# Patient Record
Sex: Male | Born: 1945 | Race: White | Hispanic: No | Marital: Married | Smoking: Former smoker
Health system: Southern US, Community
[De-identification: ages and names within clinical notes are randomized; demographics above are authoritative.]

## PROBLEM LIST (undated history)

## (undated) DIAGNOSIS — J449 Chronic obstructive pulmonary disease, unspecified: Secondary | ICD-10-CM

## (undated) DIAGNOSIS — I2510 Atherosclerotic heart disease of native coronary artery without angina pectoris: Secondary | ICD-10-CM

## (undated) DIAGNOSIS — K2990 Gastroduodenitis, unspecified, without bleeding: Secondary | ICD-10-CM

## (undated) DIAGNOSIS — R972 Elevated prostate specific antigen [PSA]: Secondary | ICD-10-CM

## (undated) DIAGNOSIS — N400 Enlarged prostate without lower urinary tract symptoms: Secondary | ICD-10-CM

## (undated) DIAGNOSIS — T8859XA Other complications of anesthesia, initial encounter: Secondary | ICD-10-CM

## (undated) DIAGNOSIS — F4310 Post-traumatic stress disorder, unspecified: Secondary | ICD-10-CM

## (undated) DIAGNOSIS — C61 Malignant neoplasm of prostate: Secondary | ICD-10-CM

## (undated) DIAGNOSIS — E785 Hyperlipidemia, unspecified: Secondary | ICD-10-CM

## (undated) DIAGNOSIS — E669 Obesity, unspecified: Secondary | ICD-10-CM

## (undated) DIAGNOSIS — Z9889 Other specified postprocedural states: Secondary | ICD-10-CM

## (undated) DIAGNOSIS — J45909 Unspecified asthma, uncomplicated: Secondary | ICD-10-CM

## (undated) DIAGNOSIS — D123 Benign neoplasm of transverse colon: Secondary | ICD-10-CM

## (undated) DIAGNOSIS — T4145XA Adverse effect of unspecified anesthetic, initial encounter: Secondary | ICD-10-CM

## (undated) DIAGNOSIS — F419 Anxiety disorder, unspecified: Secondary | ICD-10-CM

## (undated) DIAGNOSIS — N200 Calculus of kidney: Secondary | ICD-10-CM

## (undated) DIAGNOSIS — M51379 Other intervertebral disc degeneration, lumbosacral region without mention of lumbar back pain or lower extremity pain: Secondary | ICD-10-CM

## (undated) DIAGNOSIS — H8110 Benign paroxysmal vertigo, unspecified ear: Secondary | ICD-10-CM

## (undated) DIAGNOSIS — N289 Disorder of kidney and ureter, unspecified: Secondary | ICD-10-CM

## (undated) DIAGNOSIS — I10 Essential (primary) hypertension: Secondary | ICD-10-CM

## (undated) DIAGNOSIS — K2970 Gastritis, unspecified, without bleeding: Secondary | ICD-10-CM

## (undated) DIAGNOSIS — IMO0002 Reserved for concepts with insufficient information to code with codable children: Secondary | ICD-10-CM

## (undated) DIAGNOSIS — I219 Acute myocardial infarction, unspecified: Secondary | ICD-10-CM

## (undated) DIAGNOSIS — R112 Nausea with vomiting, unspecified: Secondary | ICD-10-CM

## (undated) DIAGNOSIS — Z87442 Personal history of urinary calculi: Secondary | ICD-10-CM

## (undated) DIAGNOSIS — M5137 Other intervertebral disc degeneration, lumbosacral region: Secondary | ICD-10-CM

## (undated) DIAGNOSIS — Q849 Congenital malformation of integument, unspecified: Secondary | ICD-10-CM

## (undated) HISTORY — DX: Benign paroxysmal vertigo, unspecified ear: H81.10

## (undated) HISTORY — DX: Congenital malformation of integument, unspecified: Q84.9

## (undated) HISTORY — DX: Other intervertebral disc degeneration, lumbosacral region: M51.37

## (undated) HISTORY — DX: Atherosclerotic heart disease of native coronary artery without angina pectoris: I25.10

## (undated) HISTORY — DX: Post-traumatic stress disorder, unspecified: F43.10

## (undated) HISTORY — PX: CHOLECYSTECTOMY: SHX55

## (undated) HISTORY — PX: CYST EXCISION: SHX5701

## (undated) HISTORY — PX: COLONOSCOPY: SHX174

## (undated) HISTORY — DX: Anxiety disorder, unspecified: F41.9

## (undated) HISTORY — DX: Benign prostatic hyperplasia without lower urinary tract symptoms: N40.0

## (undated) HISTORY — PX: KNEE ARTHROSCOPY: SUR90

## (undated) HISTORY — PX: HERNIA REPAIR: SHX51

## (undated) HISTORY — DX: Benign neoplasm of transverse colon: D12.3

## (undated) HISTORY — PX: LUMBAR LAMINECTOMY: SHX95

## (undated) HISTORY — PX: BACK SURGERY: SHX140

## (undated) HISTORY — DX: Gastritis, unspecified, without bleeding: K29.70

## (undated) HISTORY — DX: Other intervertebral disc degeneration, lumbosacral region without mention of lumbar back pain or lower extremity pain: M51.379

## (undated) HISTORY — DX: Unspecified asthma, uncomplicated: J45.909

## (undated) HISTORY — DX: Personal history of urinary calculi: Z87.442

## (undated) HISTORY — DX: Hyperlipidemia, unspecified: E78.5

## (undated) HISTORY — DX: Gastroduodenitis, unspecified, without bleeding: K29.90

## (undated) HISTORY — DX: Obesity, unspecified: E66.9

## (undated) HISTORY — DX: Elevated prostate specific antigen (PSA): R97.20

## (undated) HISTORY — DX: Acute myocardial infarction, unspecified: I21.9

## (undated) HISTORY — DX: Chronic obstructive pulmonary disease, unspecified: J44.9

## (undated) HISTORY — DX: Essential (primary) hypertension: I10

## (undated) HISTORY — PX: VASECTOMY: SHX75

## (undated) HISTORY — PX: INGUINAL HERNIA REPAIR: SUR1180

## (undated) HISTORY — DX: Malignant neoplasm of prostate: C61

## (undated) HISTORY — PX: CARPAL TUNNEL RELEASE: SHX101

## (undated) HISTORY — DX: Reserved for concepts with insufficient information to code with codable children: IMO0002

---
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
## 2008-09-26 ENCOUNTER — Inpatient Hospital Stay: Payer: Self-pay | Admitting: Internal Medicine

## 2008-10-04 ENCOUNTER — Ambulatory Visit: Payer: Self-pay | Admitting: Family Medicine

## 2009-03-06 HISTORY — PX: CARDIAC CATHETERIZATION: SHX172

## 2009-06-14 ENCOUNTER — Ambulatory Visit: Payer: Self-pay | Admitting: Family Medicine

## 2009-07-07 ENCOUNTER — Encounter: Payer: Self-pay | Admitting: Family Medicine

## 2009-07-26 ENCOUNTER — Ambulatory Visit: Payer: Self-pay | Admitting: Family Medicine

## 2009-08-06 ENCOUNTER — Encounter: Payer: Self-pay | Admitting: Family Medicine

## 2012-10-16 ENCOUNTER — Ambulatory Visit: Payer: Self-pay | Admitting: Family Medicine

## 2012-10-22 ENCOUNTER — Encounter: Payer: Self-pay | Admitting: Family Medicine

## 2012-11-04 ENCOUNTER — Encounter: Payer: Self-pay | Admitting: Family Medicine

## 2013-03-18 ENCOUNTER — Ambulatory Visit: Payer: Self-pay | Admitting: Family Medicine

## 2013-05-28 ENCOUNTER — Ambulatory Visit: Payer: Self-pay | Admitting: Family Medicine

## 2013-06-04 ENCOUNTER — Ambulatory Visit: Payer: Self-pay | Admitting: Family Medicine

## 2013-06-05 ENCOUNTER — Encounter: Payer: Self-pay | Admitting: *Deleted

## 2013-06-05 ENCOUNTER — Encounter: Payer: Self-pay | Admitting: Cardiovascular Disease

## 2013-06-05 ENCOUNTER — Ambulatory Visit (INDEPENDENT_AMBULATORY_CARE_PROVIDER_SITE_OTHER): Payer: BC Managed Care – PPO | Admitting: Cardiovascular Disease

## 2013-06-05 ENCOUNTER — Encounter (INDEPENDENT_AMBULATORY_CARE_PROVIDER_SITE_OTHER): Payer: Self-pay

## 2013-06-05 VITALS — BP 130/92 | HR 68 | Ht 67.0 in | Wt 238.8 lb

## 2013-06-05 VITALS — BP 134/84 | HR 68 | Ht 67.0 in | Wt 238.8 lb

## 2013-06-05 DIAGNOSIS — R079 Chest pain, unspecified: Secondary | ICD-10-CM

## 2013-06-05 DIAGNOSIS — I10 Essential (primary) hypertension: Secondary | ICD-10-CM | POA: Insufficient documentation

## 2013-06-05 DIAGNOSIS — E785 Hyperlipidemia, unspecified: Secondary | ICD-10-CM | POA: Insufficient documentation

## 2013-06-05 MED ORDER — ESOMEPRAZOLE MAGNESIUM 40 MG PO PACK: 40.0000 mg | PACK | Freq: Every day | ORAL | Status: DC

## 2013-06-05 NOTE — Assessment & Plan Note (Signed)
Chest pain is overall atypical. He did have previous cardiac catheterization in 2010 which showed no significant coronary artery disease. I proceeded with a treadmill stress test today which showed no evidence of ischemia. He did not have any chest/epigastric pain with exercise. Based on the above, I do not think that his chest pain is cardiac. Based on his description, it's likely GI in nature. Thus, I prescribed Nexium 40 mg once daily. I asked him to followup with Dr. Thana Ates to evaluate the response. If he continues to have discomfort, consider abdominal ultrasound to evaluate for gallbladder disease. He is to followup with me as needed.

## 2013-06-05 NOTE — Patient Instructions (Signed)
Your stress test is normal.   Start Nexium 40 mg once daily.   Follow up as needed.

## 2013-06-05 NOTE — Procedures (Signed)
   Treadmill Stress test  Indication: chest pain.   Baseline Data:  Resting EKG shows NSR with rate of 88 bpm, no significant ST or T wave changes.  Resting blood pressure of 134/84 mm Hg Stand bruce protocal was used.  Exercise Data:  Patient exercised for 6 min 0 sec,  Peak heart rate of 140 bpm.  This was 90% of the maximum predicted heart rate. No symptoms of chest pain or lightheadedness were reported at peak stress or in recovery.  Peak Blood pressure recorded was 168/80 Maximal work level: 7 METs.  Heart rate at 3 minutes in recovery was 93 bpm. BP response: normal HR response: normal  EKG with Exercise: sinus tachycardia with no significant ST changes.   FINAL IMPRESSION: Normal exercise stress test. No significant EKG changes concerning for ischemia. average exercise tolerance.  Recommendation: chest pain is likely noncardiac.

## 2013-06-05 NOTE — Patient Instructions (Signed)
Your physician has requested that you have an exercise tolerance test. For further information please visit [email or web address]. Please also follow instruction sheet, as given.   

## 2013-06-05 NOTE — Assessment & Plan Note (Signed)
Blood pressure is reasonably controlled on Benicar.

## 2013-06-05 NOTE — Progress Notes (Signed)
Primary care physician: Dr. Charlette Caffey  HPI  This is a pleasant 67 year old man who was referred for evaluation of chest pain. There is questionable history of prior myocardial infarction at the [place] . However, he had cardiac catheterization done in 2010 which showed no evidence of coronary artery disease. He has chronic medical conditions that include hypertension, hyperlipidemia, obesity and anxiety. He started having chest discomfort a few weeks ago. This is described as a band that sounds from the upper epigastric area and lower chest area all the way around to his back. He first noticed it after he took meloxicam for neck discomfort. This discomfort has not been related to physical activity. No significant shortness of breath, orthopnea or PND. He does not have history of diabetes or tobacco use. There is no family history of premature coronary artery disease.  No Known Allergies   No current outpatient prescriptions on file prior to visit.   No current facility-administered medications on file prior to visit.     Past Medical History  Diagnosis Date  . PTSD (post-traumatic stress disorder)   . COPD (chronic obstructive pulmonary disease)   . MI (myocardial infarction)   . Asthma   . Atherosclerosis   . Other and unspecified disc disorder of cervical region   . Benign paroxysmal positional vertigo   . Thoracic or lumbosacral neuritis or radiculitis, unspecified   . Obesity, unspecified   . Insomnia, unspecified   . Unspecified congenital anomaly of the integument   . Pruritus ani   . Actinic keratosis   . Degeneration of lumbar or lumbosacral intervertebral disc   . Esophageal reflux   . Unspecified gastritis and gastroduodenitis without mention of hemorrhage   . Inguinal hernia without mention of obstruction or gangrene, unilateral or unspecified, (not specified as recurrent)   . Pain in joint, lower leg   . Edema   . Acute laryngitis, without mention of obstruction   .  Hyperlipidemia   . Hypertension      Past Surgical History  Procedure Laterality Date  . Vasectomy    . Hemorrhoidectomy with hemorrhoid banding    . Knee arthroscopy    . Inguinal hernia repair    . Carpal tunnel release Right   . Lumbar laminectomy    . Colonoscopy    . Cardiac catheterization  03/2009    [facility]: No significant coronary artery disease     Family History  Problem Relation Age of Onset  . Kidney failure Mother      History   Social History  . Marital Status: Married    Spouse Name: N/A    Number of Children: N/A  . Years of Education: N/A   Occupational History  . Not on file.   Social History Main Topics  . Smoking status: Former Smoker -- 1.00 packs/day for 20 years    Types: Cigarettes  . Smokeless tobacco: Current User    Types: Chew  . Alcohol Use: No  . Drug Use: No  . Sexual Activity: Not on file   Other Topics Concern  . Not on file   Social History Narrative  . No narrative on file     ROS A 10 point review of system was performed. It is negative other than that mentioned in the history of present illness.   PHYSICAL EXAM   BP 130/92  Pulse 68  Ht 5\' 7"  (1.702 m)  Wt 238 lb 12 oz (108.296 kg)  BMI 37.38 kg/m2 Constitutional: He is oriented  to person, place, and time. He appears well-developed and well-nourished. No distress.  HENT: No nasal discharge.  Head: Normocephalic and atraumatic.  Eyes: Pupils are equal and round.  No discharge. Neck: Normal range of motion. Neck supple. No JVD present. No thyromegaly present.  Cardiovascular: Normal rate, regular rhythm, normal heart sounds. Exam reveals no gallop and no friction rub. No murmur heard.  Pulmonary/Chest: Effort normal and breath sounds normal. No stridor. No respiratory distress. He has no wheezes. He has no rales. He exhibits no tenderness.  Abdominal: Soft. Bowel sounds are normal. He exhibits no distension. There is no tenderness. There is no rebound and no  guarding.  Musculoskeletal: Normal range of motion. He exhibits no edema and no tenderness.  Neurological: He is alert and oriented to person, place, and time. Coordination normal.  Skin: Skin is warm and dry. No rash noted. He is not diaphoretic. No erythema. No pallor.  Psychiatric: He has a normal mood and affect. His behavior is normal. Judgment and thought content normal.       [id number]  Rhythm  WITHIN NORMAL LIMITS   ASSESSMENT AND PLAN

## 2013-06-06 ENCOUNTER — Ambulatory Visit: Payer: Self-pay | Admitting: Family Medicine

## 2013-06-30 ENCOUNTER — Ambulatory Visit: Payer: Self-pay | Admitting: Family Medicine

## 2013-07-22 ENCOUNTER — Ambulatory Visit: Payer: Self-pay | Admitting: Surgery

## 2013-07-22 LAB — BASIC METABOLIC PANEL
Anion Gap: 6 — ABNORMAL LOW (ref 7–16)
Co2: 25 mmol/L (ref 21–32)
Creatinine: 0.94 mg/dL (ref 0.60–1.30)
EGFR (Non-African Amer.): >60
Osmolality: 274 (ref 275–301)
Potassium: 3.8 mmol/L (ref 3.5–5.1)

## 2013-08-13 ENCOUNTER — Ambulatory Visit: Payer: Self-pay | Admitting: Surgery

## 2013-08-14 LAB — PATHOLOGY REPORT

## 2013-09-14 ENCOUNTER — Ambulatory Visit: Payer: Self-pay | Admitting: Family Medicine

## 2013-10-04 ENCOUNTER — Ambulatory Visit: Payer: Self-pay | Admitting: Family Medicine

## 2014-01-04 HISTORY — PX: CARDIAC CATHETERIZATION: SHX172

## 2014-01-15 ENCOUNTER — Encounter: Payer: Self-pay | Admitting: Cardiovascular Disease

## 2014-01-15 ENCOUNTER — Ambulatory Visit (INDEPENDENT_AMBULATORY_CARE_PROVIDER_SITE_OTHER): Payer: BC Managed Care – PPO | Admitting: Cardiovascular Disease

## 2014-01-15 VITALS — BP 122/84 | HR 84 | Ht 67.0 in | Wt 214.0 lb

## 2014-01-15 DIAGNOSIS — R079 Chest pain, unspecified: Secondary | ICD-10-CM

## 2014-01-15 DIAGNOSIS — I10 Essential (primary) hypertension: Secondary | ICD-10-CM

## 2014-01-15 NOTE — Patient Instructions (Addendum)
[facility] Cardiac Cath Instructions   You are scheduled for a Cardiac Cath on:______6/18/15___________________  Please arrive at _07:30______am on the day of your procedure  You will need to pre-register prior to the day of your procedure.  Enter through the [employer] at [facility].  Registration is the first desk on your right.  Please take the procedure order we have given you in order to be registered appropriately  Do not eat/drink anything after midnight  Someone will need to drive you home  It is recommended someone be with you for the first 24 hours after your procedure  Wear clothes that are easy to get on/off and wear slip on shoes if possible     Day of your procedure: Arrive at the [place] entrance.  Free valet service is available.  After entering the [place] please check-in at the registration desk (1st desk on your right) to receive your armband. After receiving your armband someone will escort you to the cardiac cath/special procedures waiting area.   The usual length of stay after your procedure is about 2 to 3 hours.  This can vary.  If you have any questions, please call our office at [phone], or you may call the cardiac cath lab at [facility] directly at [phone]  Your physician recommends that you have labs today.

## 2014-01-15 NOTE — Progress Notes (Signed)
Primary care physician: Dr. Rutherford Nail  HPI  This is a pleasant 68 year old man who was is here today for a followup visit regarding chest pain . There is questionable history of prior myocardial infarction at the [place] . However, he had cardiac catheterization done in 2010 which showed no evidence of coronary artery disease. He has chronic medical conditions that include hypertension, hyperlipidemia, obesity and anxiety. He was seen last year for chest pain. He underwent a treadmill stress test which showed no evidence of ischemia. He was ultimately found to have cholelithiasis and underwent cholecystectomy in January of this year. He also reports having diverticulitis. He was referred back due to continued symptoms of left-sided and substernal chest pain described as aching which happens both at rest and with physical activities. This has been associated with increased exertional dyspnea. No orthopnea, PND or lower extremity edema. No syncope.    No Known Allergies   Current Outpatient Prescriptions on File Prior to Visit  Medication Sig Dispense Refill  . albuterol (PROVENTIL HFA;VENTOLIN HFA) 108 (90 BASE) MCG/ACT inhaler Inhale 2 puffs into the lungs every 4 (four) hours as needed for wheezing.      . budesonide-formoterol (SYMBICORT) 160-4.5 MCG/ACT inhaler Inhale 2 puffs into the lungs 2 (two) times daily.      Marland Kitchen esomeprazole (NEXIUM) 40 MG packet Take 40 mg by mouth daily before breakfast.  30 each  3  . FLUoxetine (PROZAC) 20 MG tablet Take 20 mg by mouth daily.      Marland Kitchen olmesartan (BENICAR) 20 MG tablet Take 20 mg by mouth daily.       No current facility-administered medications on file prior to visit.     Past Medical History  Diagnosis Date  . PTSD (post-traumatic stress disorder)   . COPD (chronic obstructive pulmonary disease)   . MI (myocardial infarction)   . Asthma   . Atherosclerosis   . Other and unspecified disc disorder of cervical region   . Benign paroxysmal  positional vertigo   . Thoracic or lumbosacral neuritis or radiculitis, unspecified   . Obesity, unspecified   . Insomnia, unspecified   . Unspecified congenital anomaly of the integument   . Pruritus ani   . Actinic keratosis   . Degeneration of lumbar or lumbosacral intervertebral disc   . Esophageal reflux   . Unspecified gastritis and gastroduodenitis without mention of hemorrhage   . Inguinal hernia without mention of obstruction or gangrene, unilateral or unspecified, (not specified as recurrent)   . Pain in joint, lower leg   . Edema   . Acute laryngitis, without mention of obstruction   . Hyperlipidemia   . Hypertension   . Diverticulitis      Past Surgical History  Procedure Laterality Date  . Vasectomy    . Hemorrhoidectomy with hemorrhoid banding    . Knee arthroscopy    . Inguinal hernia repair    . Carpal tunnel release Right   . Lumbar laminectomy    . Colonoscopy    . Cardiac catheterization  03/2009    [facility]: No significant coronary artery disease  . Cholecystectomy    . Hernia repair       Family History  Problem Relation Age of Onset  . Kidney failure Mother      History   Social History  . Marital Status: Married    Spouse Name: N/A    Number of Children: N/A  . Years of Education: N/A   Occupational History  . Not on  file.   Social History Main Topics  . Smoking status: Former Smoker -- 1.00 packs/day for 20 years    Types: Cigarettes  . Smokeless tobacco: Current User    Types: Chew  . Alcohol Use: No  . Drug Use: No  . Sexual Activity: Not on file   Other Topics Concern  . Not on file   Social History Narrative  . No narrative on file     ROS A 10 point review of system was performed. It is negative other than that mentioned in the history of present illness.   PHYSICAL EXAM   BP 122/84  Pulse 84  Ht 5\' 7"  (1.702 m)  Wt 214 lb (97.07 kg)  BMI 33.51 kg/m2 Constitutional: He is oriented to person, place, and time. He  appears well-developed and well-nourished. No distress.  HENT: No nasal discharge.  Head: Normocephalic and atraumatic.  Eyes: Pupils are equal and round.  No discharge. Neck: Normal range of motion. Neck supple. No JVD present. No thyromegaly present.  Cardiovascular: Normal rate, regular rhythm, normal heart sounds. Exam reveals no gallop and no friction rub. No murmur heard.  Pulmonary/Chest: Effort normal and breath sounds normal. No stridor. No respiratory distress. He has no wheezes. He has no rales. He exhibits no tenderness.  Abdominal: Soft. Bowel sounds are normal. He exhibits no distension. There is no tenderness. There is no rebound and no guarding.  Musculoskeletal: Normal range of motion. He exhibits no edema and no tenderness.  Neurological: He is alert and oriented to person, place, and time. Coordination normal.  Skin: Skin is warm and dry. No rash noted. He is not diaphoretic. No erythema. No pallor.  Psychiatric: He has a normal mood and affect. His behavior is normal. Judgment and thought content normal.       WKM:[id number]  Rhythm  -With rate variation  cv = 12. WITHIN NORMAL LIMITS   ASSESSMENT AND PLAN

## 2014-01-15 NOTE — Assessment & Plan Note (Signed)
Blood pressure is reasonably controlled on current medications. 

## 2014-01-15 NOTE — Assessment & Plan Note (Signed)
The patient continues to have chest pain of unclear etiology. Initially symptoms were thought to be due to cholelithiasis. However, symptoms persisted in spite of cholecystectomy. The chest pain is substernal and left-sided with some exertional component. Thus, a cardiac etiology cannot be excluded. He did have a treadmill stress test done last year which overall was unremarkable. However, given his continued symptoms and increased exertional dyspnea I do agree that a definitive diagnosis is needed in terms of his cardiac status. I discussed with him different management options including coronary CTA versus cardiac catheterization. Given his obesity and high resting heart rate, CTA might have limitations. Thus, cardiac catheterization is recommended. Risks, benefits and alternatives were discussed with the patient and his wife.

## 2014-01-16 LAB — CBC WITH DIFFERENTIAL
Basophils Absolute: 0 x10E3/uL (ref 0.0–0.2)
Basos: 1 %
Eos: 5 %
Eosinophils Absolute: 0.4 x10E3/uL (ref 0.0–0.4)
HCT: 47.9 % (ref 37.5–51.0)
Hemoglobin: 16.7 g/dL (ref 12.6–17.7)
Immature Grans (Abs): 0 x10E3/uL (ref 0.0–0.1)
Immature Granulocytes: 0 %
Lymphocytes Absolute: 2 x10E3/uL (ref 0.7–3.1)
Lymphs: 25 %
MCH: 30.2 pg (ref 26.6–33.0)
MCHC: 34.9 g/dL (ref 31.5–35.7)
MCV: 87 fL (ref 79–97)
Monocytes Absolute: 0.6 x10E3/uL (ref 0.1–0.9)
Monocytes: 8 %
Neutrophils Absolute: 4.9 x10E3/uL (ref 1.4–7.0)
Neutrophils Relative %: 61 %
Platelets: 249 x10E3/uL (ref 150–379)
RBC: 5.53 x10E6/uL (ref 4.14–5.80)
RDW: 13.6 % (ref 12.3–15.4)
WBC: 7.9 x10E3/uL (ref 3.4–10.8)

## 2014-01-16 LAB — BASIC METABOLIC PANEL WITH GFR
BUN/Creatinine Ratio: 15 (ref 10–22)
BUN: 16 mg/dL (ref 8–27)
CO2: 21 mmol/L (ref 18–29)
Calcium: 9.8 mg/dL (ref 8.6–10.2)
Chloride: 102 mmol/L (ref 97–108)
Creatinine, Ser: 1.1 mg/dL (ref 0.76–1.27)
GFR calc Af Amer: 79 mL/min/1.73
GFR calc non Af Amer: 68 mL/min/1.73
Glucose: 91 mg/dL (ref 65–99)
Potassium: 4.6 mmol/L (ref 3.5–5.2)
Sodium: 139 mmol/L (ref 134–144)

## 2014-01-16 LAB — PROTIME-INR
INR: 1.1 (ref 0.8–1.2)
PROTHROMBIN TIME: 10.9 s (ref 9.1–12.0)

## 2014-01-19 ENCOUNTER — Telehealth: Payer: Self-pay | Admitting: *Deleted

## 2014-01-19 NOTE — Telephone Encounter (Signed)
Cardiac cath order and info faxed to cath lab  Santiago Glad confirmed receipt

## 2014-01-21 ENCOUNTER — Encounter: Payer: Self-pay | Admitting: Cardiovascular Disease

## 2014-01-21 ENCOUNTER — Ambulatory Visit: Payer: Self-pay | Admitting: Cardiovascular Disease

## 2014-01-21 DIAGNOSIS — R079 Chest pain, unspecified: Secondary | ICD-10-CM

## 2014-02-04 ENCOUNTER — Ambulatory Visit (INDEPENDENT_AMBULATORY_CARE_PROVIDER_SITE_OTHER): Payer: BC Managed Care – PPO | Admitting: Cardiovascular Disease

## 2014-02-04 ENCOUNTER — Encounter: Payer: Self-pay | Admitting: Cardiovascular Disease

## 2014-02-04 VITALS — BP 118/84 | HR 71 | Ht 66.5 in | Wt 219.0 lb

## 2014-02-04 DIAGNOSIS — R079 Chest pain, unspecified: Secondary | ICD-10-CM

## 2014-02-04 NOTE — Progress Notes (Signed)
Primary care physician: Dr. Rutherford Nail  HPI  This is a pleasant 68 year old man who was is here today for a followup visit regarding chest pain after recent cardiac catheterization. There is questionable history of prior myocardial infarction at the [place] . However, he had cardiac catheterization done in 2010 which showed no evidence of coronary artery disease. He has chronic medical conditions that include hypertension, hyperlipidemia, obesity and anxiety. He was seen last year for chest pain. He underwent a treadmill stress test which showed no evidence of ischemia. He was ultimately found to have cholelithiasis and underwent cholecystectomy in January of this year. He also reports having diverticulitis. He was referred back due to continued symptoms of left-sided and substernal chest pain described as aching which happens both at rest and with physical activities. This has been associated with increased exertional dyspnea.  I proceeded with cardiac catheterization via the right radial artery which showed minor irregularities with no evidence of significant coronary artery disease and normal ejection fraction.    Allergies  Allergen Reactions  . Other     Other reaction(s): Unknown Carrots, celery     Current Outpatient Prescriptions on File Prior to Visit  Medication Sig Dispense Refill  . albuterol (PROVENTIL HFA;VENTOLIN HFA) 108 (90 BASE) MCG/ACT inhaler Inhale 2 puffs into the lungs every 4 (four) hours as needed for wheezing.      . budesonide-formoterol (SYMBICORT) 160-4.5 MCG/ACT inhaler Inhale 2 puffs into the lungs 2 (two) times daily.      Marland Kitchen esomeprazole (NEXIUM) 40 MG packet Take 40 mg by mouth daily before breakfast.  30 each  3  . FLUoxetine (PROZAC) 20 MG tablet Take 20 mg by mouth daily.      Marland Kitchen olmesartan (BENICAR) 20 MG tablet Take 20 mg by mouth daily.       No current facility-administered medications on file prior to visit.     Past Medical History  Diagnosis Date  .  PTSD (post-traumatic stress disorder)   . COPD (chronic obstructive pulmonary disease)   . MI (myocardial infarction)   . Asthma   . Atherosclerosis   . Other and unspecified disc disorder of cervical region   . Benign paroxysmal positional vertigo   . Thoracic or lumbosacral neuritis or radiculitis, unspecified   . Obesity, unspecified   . Insomnia, unspecified   . Unspecified congenital anomaly of the integument   . Pruritus ani   . Actinic keratosis   . Degeneration of lumbar or lumbosacral intervertebral disc   . Esophageal reflux   . Unspecified gastritis and gastroduodenitis without mention of hemorrhage   . Inguinal hernia without mention of obstruction or gangrene, unilateral or unspecified, (not specified as recurrent)   . Pain in joint, lower leg   . Edema   . Acute laryngitis, without mention of obstruction   . Hyperlipidemia   . Hypertension   . Diverticulitis      Past Surgical History  Procedure Laterality Date  . Vasectomy    . Hemorrhoidectomy with hemorrhoid banding    . Knee arthroscopy    . Inguinal hernia repair    . Carpal tunnel release Right   . Lumbar laminectomy    . Colonoscopy    . Cholecystectomy    . Hernia repair    . Cardiac catheterization  03/2009    [facility]: No significant coronary artery disease  . Cardiac catheterization  01/2014    [facility]     Family History  Problem Relation Age of Onset  .  Kidney failure Mother      History   Social History  . Marital Status: Married    Spouse Name: N/A    Number of Children: N/A  . Years of Education: N/A   Occupational History  . Not on file.   Social History Main Topics  . Smoking status: Former Smoker -- 1.00 packs/day for 20 years    Types: Cigarettes  . Smokeless tobacco: Current User    Types: Chew  . Alcohol Use: No  . Drug Use: No  . Sexual Activity: Not on file   Other Topics Concern  . Not on file   Social History Narrative  . No narrative on file     ROS A 10  point review of system was performed. It is negative other than that mentioned in the history of present illness.   PHYSICAL EXAM   BP 118/84  Pulse 71  Ht 5' 6.5" (1.689 m)  Wt 219 lb (99.338 kg)  BMI 34.82 kg/m2 Constitutional: He is oriented to person, place, and time. He appears well-developed and well-nourished. No distress.  HENT: No nasal discharge.  Head: Normocephalic and atraumatic.  Eyes: Pupils are equal and round.  No discharge. Neck: Normal range of motion. Neck supple. No JVD present. No thyromegaly present.  Cardiovascular: Normal rate, regular rhythm, normal heart sounds. Exam reveals no gallop and no friction rub. No murmur heard.  Pulmonary/Chest: Effort normal and breath sounds normal. No stridor. No respiratory distress. He has no wheezes. He has no rales. He exhibits no tenderness.  Abdominal: Soft. Bowel sounds are normal. He exhibits no distension. There is no tenderness. There is no rebound and no guarding.  Musculoskeletal: Normal range of motion. He exhibits no edema and no tenderness.  Neurological: He is alert and oriented to person, place, and time. Coordination normal.  Skin: Skin is warm and dry. No rash noted. He is not diaphoretic. No erythema. No pallor.  Psychiatric: He has a normal mood and affect. His behavior is normal. Judgment and thought content normal.  Right radial pulse is palpable but decreased. No hematoma     EKG: Normal sinus rhythm   ASSESSMENT AND PLAN

## 2014-02-04 NOTE — Patient Instructions (Signed)
Follow up as needed

## 2014-02-04 NOTE — Assessment & Plan Note (Signed)
His chest pain is noncardiac. Recent cardiac catheterization showed no significant coronary artery disease. I do not recommend any further cardiac workup. He can followup with me as needed. If he continues to have chest pain, can consider CT scan of the chest or other workup as deemed necessary.

## 2014-06-14 ENCOUNTER — Ambulatory Visit: Payer: Self-pay | Admitting: Family Medicine

## 2014-11-27 NOTE — Op Note (Signed)
PATIENT NAME:  Willie Crane, Willie Crane MR#:  [id number] DATE OF BIRTH:  [birth year]  DATE OF PROCEDURE:  08/13/2013  PREOPERATIVE DIAGNOSIS: Chronic cholecystitis and cholelithiasis.   POSTOPERATIVE DIAGNOSIS: Chronic cholecystitis and cholelithiasis.   PROCEDURE: Laparoscopic cholecystectomy and cholangiogram.   SURGEON: Rochel Brome, M.D.   ANESTHESIA: General.   INDICATIONS: This 69 year old male had a history of epigastric pain, ultrasound findings of a small gallstone with some sludge and surgery was recommended for definitive treatment. He also incidentally had an umbilical hernia, which had had some minor discomfort and the bulge was approximately 2 cm and repair the umbilical hernia was recommended at the same operative session.  DESCRIPTION OF PROCEDURE: The patient was placed on the operating table in the supine position under general anesthesia. The abdomen was prepared with ChloraPrep and draped in a sterile manner.   A transversely oriented infraumbilical incision was made some 2 cm in length, carried down through a thin layer of subcutaneous tissue to encounter an umbilical hernia sac. This sac was dissected free from surrounding structures down into the fascial ring defect and was separated from the fascial ring defect circumferentially and inverted. Next, the fascia was grasped with a laryngeal hook and elevated. A Veress needle was inserted into the peritoneal cavity, aspirated, and irrigated with a saline solution.   Next, the peritoneal cavity was insufflated with carbon dioxide. The Veress needle was removed. A 10 mm cannula was inserted. The fascial defect was somewhat larger than the cannula and therefore, a single 0 Monocryl stitch was placed on the right lateral end to secure the port. The 10 mm, 0 degree laparoscope was inserted to view the peritoneal cavity. There was a typical appearance of a fatty liver, although no nodularity. The patient was placed in reverse Trendelenburg  position and used the laparoscope to maneuver some of the omentum so that the gallbladder was identified. Next, another incision was made in the epigastrium slightly to the right of the midline to introduce an 11 mm cannula. Two incisions were made in the lateral aspect of the right upper quadrant to introduce two 5 mm cannulas.   The gallbladder was retracted towards the right shoulder. Multiple adhesions were taken down with blunt and sharp dissection. There was a large amount of omentum obscuring the view of the gallbladder neck and therefore, another incision was made in the left paramedian area to insert an 11 mm cannula and a 10 mm 5 finger fan retractor, which was introduced and used to retract the omentum for exposure. Next, with traction on the gallbladder neck, additional adhesions were taken down. Location of porta hepatis was demonstrated. The gallbladder neck was mobilized with incision of the visceral peritoneum. The cystic duct was identified and dissected free from surrounding structures. The cystic artery was dissected free from surrounding structures. There were several small bleeding points, which were cauterized. The site was irrigated with heparinized saline solution and aspirated until hemostasis was intact. The gallbladder neck was further mobilized away from the liver. A critical view of safety was demonstrated.   Next, the cystic artery was controlled with Endo Clip proximally and distally and divided to allow better traction on the cystic duct. Next, an Endo Clip was placed across the cystic duct adjacent to the neck of the gallbladder. An incision was made in the cystic duct to introduce a Reddick catheter. Half-strength Conray-60 dye was injected as the cholangiogram was done with fluoroscopy, viewing the biliary tree and prompt flow of dye into the duodenum. No  retained stones were seen. The Reddick catheter was removed. The cystic duct was doubly ligated with endoclips and divided.  The gallbladder was dissected free from the liver with hook and cautery. It is noted that during the course of the procedure, there was some drainage of clear bile, which was aspirated and the site was irrigated and aspirated multiple times. The gallbladder was completely separated from the liver and was delivered up through the infraumbilical incision, opened and suctioned, removed and sent with a palpable small stone for routine pathology. The right upper quadrant was further inspected, irrigated and aspirated. Hemostasis was intact. The cannulas were removed. Carbon dioxide was allowed to escape from the peritoneal cavity.   Next, the completion of the umbilical hernia repair was done with just some further dissection to delineate the fascial edges. There appeared to be good thickness of the fascia and the defect was approximately 10 mm after the previously placed suture. Next, the remaining defect was closed with 0 Maxon figure-of-eight sutures with a transversely oriented suture line. Next, the 5 incisions were closed with interrupted 5-0 chromic subcuticular suture, benzoin, and Steri-Strips. Dressings were applied with paper tape. The patient tolerated surgery satisfactorily and was then prepared for transfer to the recovery room.   ____________________________ Willie Crane. Rochel Brome, MD jws:aw D: 08/13/2013 09:27:19 ET T: 08/13/2013 09:41:02 ET JOB#: [id number]  cc: Loreli Dollar, MD, <Dictator> Loreli Dollar MD ELECTRONICALLY SIGNED 08/13/2013 19:55

## 2014-12-10 ENCOUNTER — Other Ambulatory Visit: Payer: Self-pay | Admitting: Urology

## 2014-12-10 DIAGNOSIS — N5089 Other specified disorders of the male genital organs: Secondary | ICD-10-CM

## 2014-12-15 ENCOUNTER — Ambulatory Visit: Payer: Federal, State, Local not specified - PPO

## 2014-12-15 ENCOUNTER — Ambulatory Visit
Admission: RE | Admit: 2014-12-15 | Discharge: 2014-12-15 | Disposition: A | Discharge status: Home / Self Care | Payer: Federal, State, Local not specified - PPO | Source: Ambulatory Visit | Attending: Urology | Admitting: Urology

## 2014-12-15 DIAGNOSIS — N509 Disorder of male genital organs, unspecified: Secondary | ICD-10-CM | POA: Diagnosis not present

## 2014-12-15 DIAGNOSIS — N5089 Other specified disorders of the male genital organs: Secondary | ICD-10-CM

## 2014-12-15 DIAGNOSIS — N433 Hydrocele, unspecified: Secondary | ICD-10-CM | POA: Diagnosis not present

## 2014-12-15 DIAGNOSIS — N508 Other specified disorders of male genital organs: Secondary | ICD-10-CM | POA: Diagnosis present

## 2015-01-26 DIAGNOSIS — K4030 Unilateral inguinal hernia, with obstruction, without gangrene, not specified as recurrent: Secondary | ICD-10-CM | POA: Diagnosis not present

## 2015-01-27 ENCOUNTER — Other Ambulatory Visit: Payer: Self-pay | Admitting: Family Medicine

## 2015-04-27 ENCOUNTER — Other Ambulatory Visit: Payer: Self-pay | Admitting: Family Medicine

## 2015-06-07 ENCOUNTER — Other Ambulatory Visit: Payer: Self-pay | Admitting: Family Medicine

## 2015-06-10 ENCOUNTER — Other Ambulatory Visit: Payer: Self-pay | Admitting: Family Medicine

## 2015-06-24 ENCOUNTER — Other Ambulatory Visit: Payer: Self-pay

## 2015-06-24 DIAGNOSIS — N400 Enlarged prostate without lower urinary tract symptoms: Secondary | ICD-10-CM

## 2015-06-24 MED ORDER — FINASTERIDE 5 MG PO TABS: 5.0000 mg | ORAL_TABLET | Freq: Every day | ORAL | Status: DC

## 2015-06-24 NOTE — Progress Notes (Signed)
Pt needs a f/u appt prior to more refills of finasteride.

## 2015-06-29 ENCOUNTER — Telehealth: Payer: Self-pay | Admitting: Urology

## 2015-06-29 NOTE — Telephone Encounter (Signed)
Patient is due for his 6 month follow up.  He needs to make an appointment.

## 2015-07-04 NOTE — Telephone Encounter (Signed)
Left message for pt to cb to schd a 6 month reck with Larene Beach  07-04-15  Sharyn Lull

## 2015-07-05 NOTE — Telephone Encounter (Signed)
Spoke with patient and he said he will call back to schd.  Sharyn Lull

## 2015-07-06 ENCOUNTER — Encounter: Payer: Self-pay | Admitting: *Deleted

## 2015-07-06 NOTE — Telephone Encounter (Signed)
He called back and made his appointment for Monday 07-11-15  [facility]

## 2015-07-11 ENCOUNTER — Ambulatory Visit (INDEPENDENT_AMBULATORY_CARE_PROVIDER_SITE_OTHER): Payer: Federal, State, Local not specified - PPO | Admitting: Urology

## 2015-07-11 ENCOUNTER — Encounter: Payer: Self-pay | Admitting: Urology

## 2015-07-11 ENCOUNTER — Other Ambulatory Visit: Payer: Self-pay | Admitting: Family Medicine

## 2015-07-11 VITALS — BP 161/96 | HR 87 | Ht 69.0 in | Wt 237.6 lb

## 2015-07-11 DIAGNOSIS — N400 Enlarged prostate without lower urinary tract symptoms: Secondary | ICD-10-CM

## 2015-07-11 DIAGNOSIS — N433 Hydrocele, unspecified: Secondary | ICD-10-CM | POA: Diagnosis not present

## 2015-07-11 DIAGNOSIS — N138 Other obstructive and reflux uropathy: Secondary | ICD-10-CM

## 2015-07-11 DIAGNOSIS — N401 Enlarged prostate with lower urinary tract symptoms: Secondary | ICD-10-CM

## 2015-07-11 MED ORDER — FINASTERIDE 5 MG PO TABS: 5.0000 mg | ORAL_TABLET | Freq: Every day | ORAL | Status: DC

## 2015-07-11 NOTE — Progress Notes (Signed)
07/11/2015 12:21 PM   Willie Crane 1946-06-12 [id number]  Referring provider: Bobetta Lime, MD [address] [place], [place] [address]  Chief Complaint  Patient presents with  . Benign Prostatic Hypertrophy    6 month recheck    HPI: Patient is a 69 year old white male with BPH with LUTS and bilateral hydroceles who presents today for a 6 month follow up.    BPH WITH LUTS His IPSS score today is 7, which is mild lower urinary tract symptomatology. He is delighted with his quality life due to his urinary symptoms.  He denies any dysuria, hematuria or suprapubic pain.   He currently taking finasteride 5 mg daily.  He also denies any recent fevers, chills, nausea or vomiting.   He does not have a family history of PCa.      IPSS      07/11/15 1300       International Prostate Symptom Score   How often have you had the sensation of not emptying your bladder? Not at All     How often have you had to urinate less than every two hours? Less than 1 in 5 times     How often have you found you stopped and started again several times when you urinated? Less than 1 in 5 times     How often have you found it difficult to postpone urination? Not at All     How often have you had a weak urinary stream? About half the time     How often have you had to strain to start urination? Not at All     How many times did you typically get up at night to urinate? 2 Times     Total IPSS Score 7     Quality of Life due to urinary symptoms   If you were to spend the rest of your life with your urinary condition just the way it is now how would you feel about that? Delighted        Score:  1-7 Mild 8-19 Moderate 20-35 Severe  Hydroceles Patient had a scrotal ultrasound in 06/2014 which demonstrated bilateral hydroceles.  They have not changed significantly since November.  They are not causing him pain.     PMH: Past Medical History  Diagnosis Date  . PTSD (post-traumatic  stress disorder)   . COPD (chronic obstructive pulmonary disease) ([place])   . MI (myocardial infarction) ([place])   . Asthma   . Atherosclerosis   . Other and unspecified disc disorder of cervical region   . Benign paroxysmal positional vertigo   . Thoracic or lumbosacral neuritis or radiculitis, unspecified   . Obesity, unspecified   . Insomnia, unspecified   . Unspecified congenital anomaly of the integument   . Pruritus ani   . Actinic keratosis   . Degeneration of lumbar or lumbosacral intervertebral disc   . Esophageal reflux   . Unspecified gastritis and gastroduodenitis without mention of hemorrhage   . Inguinal hernia without mention of obstruction or gangrene, unilateral or unspecified, (not specified as recurrent)   . Pain in joint, lower leg   . Edema   . Acute laryngitis, without mention of obstruction   . Hyperlipidemia   . Hypertension   . Diverticulitis   . History of kidney stones   . Anxiety   . Testicular swelling   . BPH (benign prostatic hypertrophy)   . Hydrocele     Surgical History: Past Surgical  History  Procedure Laterality Date  . Vasectomy    . Hemorrhoidectomy with hemorrhoid banding    . Knee arthroscopy    . Inguinal hernia repair    . Carpal tunnel release Right   . Lumbar laminectomy    . Colonoscopy    . Cholecystectomy    . Hernia repair    . Cardiac catheterization  03/2009    [facility]: No significant coronary artery disease  . Cardiac catheterization  01/2014    [facility]  . Cyst excision      Home Medications:    Medication List       This list is accurate as of: 07/11/15 11:59 PM.  Always use your most recent med list.               BENICAR 20 MG tablet  Generic drug:  olmesartan  TAKE 1 TABLET BY MOUTH EVERY DAY     esomeprazole 40 MG packet  Commonly known as:  NEXIUM  Take 40 mg by mouth daily before breakfast.     finasteride 5 MG tablet  Commonly known as:  PROSCAR  Take 1 tablet (5 mg total) by mouth daily.      FLUoxetine 20 MG capsule  Commonly known as:  PROZAC  TAKE ONE CAPSULE BY MOUTH EVERY DAY     FLUoxetine 20 MG tablet  Commonly known as:  PROZAC  Take 20 mg by mouth daily.     PROVENTIL HFA 108 (90 BASE) MCG/ACT inhaler  Generic drug:  albuterol  INHALE 2 PUFFS EVERY 4-6 HOURS AS NEEDED     SYMBICORT 160-4.5 MCG/ACT inhaler  Generic drug:  budesonide-formoterol  USE 2 INHALATIONS ORALLY   TWICE DAILY        Allergies:  Allergies  Allergen Reactions  . Other     Other reaction(s): Unknown Carrots, celery    Family History: Family History  Problem Relation Age of Onset  . Kidney failure Mother   . Prostate cancer Neg Hx     Social History:  reports that he has quit smoking. His smoking use included Cigarettes. He has a 20 pack-year smoking history. His smokeless tobacco use includes Chew. He reports that he drinks alcohol. He reports that he does not use illicit drugs.  ROS: UROLOGY Frequent Urination?: No Hard to postpone urination?: No Burning/pain with urination?: No Get up at night to urinate?: No Leakage of urine?: No Urine stream starts and stops?: No Trouble starting stream?: No Do you have to strain to urinate?: No Blood in urine?: No Urinary tract infection?: No Sexually transmitted disease?: No Injury to kidneys or bladder?: No Painful intercourse?: No Weak stream?: No Erection problems?: No Penile pain?: No  Gastrointestinal Nausea?: No Vomiting?: No Indigestion/heartburn?: No Diarrhea?: No Constipation?: No  Constitutional Fever: No Night sweats?: No Weight loss?: No Fatigue?: No  Skin Skin rash/lesions?: No Itching?: No  Eyes Blurred vision?: No Double vision?: No  Ears/Nose/Throat Sore throat?: No Sinus problems?: No  Hematologic/Lymphatic Swollen glands?: No Easy bruising?: No  Cardiovascular Leg swelling?: No Chest pain?: No  Respiratory Cough?: No Shortness of breath?: No  Endocrine Excessive thirst?:  No  Musculoskeletal Back pain?: No Joint pain?: No  Neurological Headaches?: No Dizziness?: No  Psychologic Depression?: No Anxiety?: No  Physical Exam: BP 161/96 mmHg  Pulse 87  Ht 5\' 9"  (1.753 m)  Wt 237 lb 9.6 oz (107.775 kg)  BMI 35.07 kg/m2  GU: No CVA tenderness.  No bladder fullness or masses.  Patient  with circumcised phallus.   Urethral meatus is patent.  No penile discharge. No penile lesions or rashes. Scrotum without lesions, cysts, rashes and/or edema.  Hydroceles bilaterally. No masses are appreciated in the testicles. Left and right epididymis are normal. Rectal: Patient with  normal sphincter tone. Anus and perineum without scarring or rashes. No rectal masses are appreciated. Prostate is approximately 50 grams (could not palpated entire gland due to buttocks tissue), no nodules are appreciated. Seminal vesicles not palpable.   Laboratory Data: Lab Results  Component Value Date   WBC 7.9 01/15/2014   HGB 16.7 01/15/2014   HCT 47.9 01/15/2014   MCV 87 01/15/2014   PLT 249 01/15/2014    Lab Results  Component Value Date   CREATININE 1.10 01/15/2014  PSA History  1.9 ng/mL on 04/29/2014  1.6 ng/mL on 12/30/2014   Assessment & Plan:    1. Benign prostatic hypertrophy with LUTS:   IPSS score is 7/0.  He is doing well on finasteride 5 mg daily.  He will continue the medication.  A refill is sent to his pharmacy.  He will return in one year for IPSS score, exam and PSA.    - PSA  2. Hydroceles:   Patient will continue conservative management  concerning the hydroceles. We will continue to monitor. He will return in 1 year for exam and symptom recheck.   Return in about 1 year (around 07/10/2016) for IPSS score and exam.  Zara Council, [facility] [address], [place], [place] [address] [phone]

## 2015-07-12 ENCOUNTER — Other Ambulatory Visit: Payer: Self-pay | Admitting: Family Medicine

## 2015-07-12 DIAGNOSIS — N401 Enlarged prostate with lower urinary tract symptoms: Principal | ICD-10-CM

## 2015-07-12 DIAGNOSIS — N433 Hydrocele, unspecified: Secondary | ICD-10-CM | POA: Insufficient documentation

## 2015-07-12 DIAGNOSIS — N138 Other obstructive and reflux uropathy: Secondary | ICD-10-CM | POA: Insufficient documentation

## 2015-07-12 LAB — PSA: PROSTATE SPECIFIC AG, SERUM: 1.4 ng/mL (ref 0.0–4.0)

## 2015-07-12 MED ORDER — BUDESONIDE-FORMOTEROL FUMARATE 160-4.5 MCG/ACT IN AERO: 2.0000 | INHALATION_SPRAY | Freq: Two times a day (BID) | RESPIRATORY_TRACT | Status: DC

## 2015-07-13 ENCOUNTER — Telehealth: Payer: Self-pay

## 2015-07-13 DIAGNOSIS — R972 Elevated prostate specific antigen [PSA]: Secondary | ICD-10-CM

## 2015-07-13 NOTE — Telephone Encounter (Signed)
Spoke with pt in reference to PSA results. Pt voiced understanding. Orders placed. 

## 2015-07-13 NOTE — Telephone Encounter (Signed)
-----   Message from Nori Riis, PA-C sent at 07/12/2015 12:23 PM EST ----- Patient's PSA is stable.  We will see him in 12 months.  PSA to be drawn before his next appointment.

## 2015-07-30 ENCOUNTER — Other Ambulatory Visit: Payer: Self-pay | Admitting: Family Medicine

## 2015-09-28 ENCOUNTER — Ambulatory Visit
Admission: RE | Admit: 2015-09-28 | Discharge: 2015-09-28 | Disposition: A | Discharge status: Home / Self Care | Payer: Federal, State, Local not specified - PPO | Source: Ambulatory Visit | Attending: Family Medicine | Admitting: Family Medicine

## 2015-09-28 ENCOUNTER — Encounter: Payer: Self-pay | Admitting: Family Medicine

## 2015-09-28 ENCOUNTER — Ambulatory Visit (INDEPENDENT_AMBULATORY_CARE_PROVIDER_SITE_OTHER): Payer: Federal, State, Local not specified - PPO | Admitting: Family Medicine

## 2015-09-28 VITALS — BP 126/74 | HR 94 | Temp 98.5°F | Resp 16 | Wt 237.0 lb

## 2015-09-28 DIAGNOSIS — R1032 Left lower quadrant pain: Secondary | ICD-10-CM

## 2015-09-28 DIAGNOSIS — J4530 Mild persistent asthma, uncomplicated: Secondary | ICD-10-CM

## 2015-09-28 DIAGNOSIS — J4540 Moderate persistent asthma, uncomplicated: Secondary | ICD-10-CM | POA: Insufficient documentation

## 2015-09-28 DIAGNOSIS — N433 Hydrocele, unspecified: Secondary | ICD-10-CM | POA: Diagnosis not present

## 2015-09-28 NOTE — Patient Instructions (Signed)
1) If swelling progresses please consult with Urology physician

## 2015-09-28 NOTE — Progress Notes (Signed)
Name: Willie Crane   MRN: [id number]    DOB: [birth year]   Date:09/28/2015       Progress Note  Subjective  Chief Complaint  Chief Complaint  Patient presents with  . Groin Swelling    left onset years has already had [place] and urology appointment.  But wants 2nd opionion    HPI  Patient is a 70 year old white male with BPH with LUTS and bilateral hydroceles who presents today with concern regarding the same bilateral hydroceles. He denies any dysuria, hematuria or suprapubic pain.But has been working out at [employer] and swimming more and notes left lower quadrant discomfort and general pressure over his genitals. But when he lifts his abdomen up the discomfort is relieved. He currently taking finasteride 5 mg daily. He also denies any recent fevers, chills, nausea or vomiting. He does not have a family history of PCa. Has been working with [facility], last seen 07/11/15. Scrotal US 12/15/14 does confirm small bilateral hydrocele and there is no change over time.   Asthma Follow-up: He has previously been evaluated here for asthma and presents for an asthma follow-up; he is not currently in exacerbation. Symptoms currently include none and occur less than 2x/month. Observed precipitants include dust, fumes and upper respiratory infection.  Current limitations in activity from asthma: none.  Number of days of school or work missed in the last month: 0. Number of Emergency Department visits in the previous month: none. Frequency of use of quick-relief meds: 2x/month. The patient reports adherence to this regimen using Symbicort daily.    Past Medical History  Diagnosis Date  . PTSD (post-traumatic stress disorder)   . COPD (chronic obstructive pulmonary disease) ([place])   . MI (myocardial infarction) ([place])   . Asthma   . Atherosclerosis   . Other and unspecified disc disorder of cervical region   . Benign paroxysmal positional vertigo   . Thoracic or lumbosacral neuritis or  radiculitis, unspecified   . Obesity, unspecified   . Insomnia, unspecified   . Unspecified congenital anomaly of the integument   . Pruritus ani   . Actinic keratosis   . Degeneration of lumbar or lumbosacral intervertebral disc   . Esophageal reflux   . Unspecified gastritis and gastroduodenitis without mention of hemorrhage   . Inguinal hernia without mention of obstruction or gangrene, unilateral or unspecified, (not specified as recurrent)   . Pain in joint, lower leg   . Edema   . Acute laryngitis, without mention of obstruction   . Hyperlipidemia   . Hypertension   . Diverticulitis   . History of kidney stones   . Anxiety   . Testicular swelling   . BPH (benign prostatic hypertrophy)   . Hydrocele     Patient Active Problem List   Diagnosis Date Noted  . 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 Hydrocele, bilateral 07/12/2015  . Chest pain 06/05/2013  . Hyperlipidemia   . Hypertension     Social History  Substance Use Topics  . Smoking status: Former Smoker -- 1.00 packs/day for 20 years    Types: Cigarettes  . Smokeless tobacco: Current User    Types: Chew     Comment: quit smoking 30 years  . Alcohol Use: 0.0 oz/week    0 Standard drinks or equivalent per week     Comment: wine     Current outpatient prescriptions:  .  BENICAR 20 MG tablet, TAKE 1 TABLET BY MOUTH EVERY DAY,  Disp: 90 tablet, Rfl: 3 .  budesonide-formoterol (SYMBICORT) 160-4.5 MCG/ACT inhaler, Inhale 2 puffs into the lungs 2 (two) times daily., Disp: 30.6 g, Rfl: 1 .  finasteride (PROSCAR) 5 MG tablet, Take 1 tablet (5 mg total) by mouth daily., Disp: 90 tablet, Rfl: 3 .  FLUoxetine (PROZAC) 20 MG capsule, TAKE ONE CAPSULE BY MOUTH EVERY DAY, Disp: 90 capsule, Rfl: 0 .  PROVENTIL HFA 108 (90 BASE) MCG/ACT inhaler, INHALE 2 PUFFS EVERY 4-6 HOURS AS NEEDED, Disp: 18 g, Rfl: 3  Past Surgical History  Procedure Laterality Date  . Vasectomy    . Hemorrhoidectomy with  hemorrhoid banding    . Knee arthroscopy    . Inguinal hernia repair    . Carpal tunnel release Right   . Lumbar laminectomy    . Colonoscopy    . Cholecystectomy    . Hernia repair    . Cardiac catheterization  03/2009    [facility]: No significant coronary artery disease  . Cardiac catheterization  01/2014    [facility]  . Cyst excision      Family History  Problem Relation Age of Onset  . Kidney failure Mother   . Prostate cancer Neg Hx     Allergies  Allergen Reactions  . Other     Other reaction(s): Unknown Carrots, celery     Review of Systems  CONSTITUTIONAL: No significant weight changes, fever, chills, weakness or fatigue.  CARDIOVASCULAR: No chest pain, chest pressure or chest discomfort. No palpitations or edema.  RESPIRATORY: No shortness of breath, cough or sputum.  GASTROINTESTINAL: No anorexia, nausea, vomiting. No changes in bowel habits.  GENITOURINARY: No dysuria. No frequency. No discharge. Yes large scrotum. NEUROLOGICAL: No headache, dizziness, syncope, paralysis, ataxia, numbness or tingling in the extremities. No memory changes. No change in bowel or bladder control.  MUSCULOSKELETAL: No joint pain. No muscle pain. HEMATOLOGIC: No anemia, bleeding or bruising.  LYMPHATICS: No enlarged lymph nodes.  PSYCHIATRIC: No change in mood. No change in sleep pattern.  ENDOCRINOLOGIC: No reports of sweating, cold or heat intolerance. No polyuria or polydipsia.     Objective  BP 126/74 mmHg  Pulse 94  Temp(Src) 98.5 F (36.9 C) (Oral)  Resp 16  Wt 237 lb (107.502 kg)  SpO2 94% Body mass index is 34.98 kg/(m^2).  Physical Exam  Constitutional: Patient is overweight and well-nourished. In no distress. .  Cardiovascular: Normal rate, regular rhythm and normal heart sounds.  No murmur heard.  Pulmonary/Chest: Effort normal and breath sounds normal. No respiratory distress. Abdomen: Soft, distended/obese, no hernias appreciated, normal bowel sounds throughout, no  guarding or rebound on exam, no fluid thrill. Genital: Bilateral descended testes with no mass, general scrotal swelling bilaterally with soft fluid, no varicoceles, penis normal with no masses or lesions. No hernias appreciated on valsalva or cough. Musculoskeletal: Normal range of motion bilateral UE and LE, no joint effusions. Peripheral vascular: Bilateral LE no edema. Neurological: CN II-XII grossly intact with no focal deficits. Alert and oriented to person, place, and time. Coordination, balance, strength, speech and gait are normal.  Skin: Skin is warm and dry. No rash noted. No erythema.  Psychiatric: Patient has an anxious mood and affect. Behavior is normal in office today. Judgment and thought content normal in office today.   Recent Results (from the past 2160 hour(s))  PSA     Status: None   Collection Time: 07/11/15  1:33 PM  Result Value Ref Range   Prostate Specific Ag, Serum 1.4 0.0 -  4.0 ng/mL    Comment: Roche ECLIA methodology. According to the American Urological Association, Serum PSA should decrease and remain at undetectable levels after radical prostatectomy. The AUA defines biochemical recurrence as an initial PSA value 0.2 ng/mL or greater followed by a subsequent confirmatory PSA value 0.2 ng/mL or greater. Values obtained with different assay methods or kits cannot be used interchangeably. Results cannot be interpreted as absolute evidence of the presence or absence of malignant disease.      Assessment & Plan   1. Hydrocele, bilateral We discussed pathology, progression of swelling, surgical intervention options. Reassurance provided. If in 2 months his symptoms have progressed I encouraged him to request consultation with a Urology physician and discuss treatment options.   2. Abdominal pain, left lower quadrant I suspect muscle related discomfort vs constipation or descending colon pathology. I did not appreciated left inguinal hernia on exam today.  Will get abdominal x-ray to assess for colitis or stone or constipation.  - DG Abd 2 Views; Future  3. Mild persistent asthma in adult without complication Well controled.

## 2015-10-05 ENCOUNTER — Ambulatory Visit (INDEPENDENT_AMBULATORY_CARE_PROVIDER_SITE_OTHER): Payer: Federal, State, Local not specified - PPO

## 2015-10-05 DIAGNOSIS — Z23 Encounter for immunization: Secondary | ICD-10-CM | POA: Diagnosis not present

## 2015-10-21 ENCOUNTER — Other Ambulatory Visit: Payer: Self-pay

## 2015-10-21 ENCOUNTER — Ambulatory Visit (INDEPENDENT_AMBULATORY_CARE_PROVIDER_SITE_OTHER): Payer: Federal, State, Local not specified - PPO | Admitting: Family Medicine

## 2015-10-21 ENCOUNTER — Telehealth: Payer: Self-pay

## 2015-10-21 ENCOUNTER — Encounter: Payer: Self-pay | Admitting: Family Medicine

## 2015-10-21 VITALS — BP 118/68 | HR 90 | Temp 98.2°F | Resp 20 | Ht 69.0 in | Wt 239.4 lb

## 2015-10-21 DIAGNOSIS — Z1211 Encounter for screening for malignant neoplasm of colon: Secondary | ICD-10-CM | POA: Insufficient documentation

## 2015-10-21 NOTE — Progress Notes (Addendum)
Name: Willie Crane   MRN: [id number]    DOB: [birth year]   Date:10/21/2015       Progress Note  Subjective  Chief Complaint  Chief Complaint  Patient presents with  . GI Problem    pt here for a colonscopy referral    HPI  Willie Crane is a 70 year old male here to request colonoscopy referral. Has a history of gastritis, diverticulitis and currently not taking PPI for GERD symptoms, which are intermittent. Reports no concerning symptoms or complaints today.   Past Medical History  Diagnosis Date  . PTSD (post-traumatic stress disorder)   . COPD (chronic obstructive pulmonary disease) ([place])   . MI (myocardial infarction) ([place])   . Asthma   . Atherosclerosis   . Other and unspecified disc disorder of cervical region   . Benign paroxysmal positional vertigo   . Thoracic or lumbosacral neuritis or radiculitis, unspecified   . Obesity, unspecified   . Insomnia, unspecified   . Unspecified congenital anomaly of the integument   . Pruritus ani   . Actinic keratosis   . Degeneration of lumbar or lumbosacral intervertebral disc   . Esophageal reflux   . Unspecified gastritis and gastroduodenitis without mention of hemorrhage   . Inguinal hernia without mention of obstruction or gangrene, unilateral or unspecified, (not specified as recurrent)   . Pain in joint, lower leg   . Edema   . Acute laryngitis, without mention of obstruction   . Hyperlipidemia   . Hypertension   . Diverticulitis   . History of kidney stones   . Anxiety   . Testicular swelling   . BPH (benign prostatic hypertrophy)   . Hydrocele     Patient Active Problem List   Diagnosis Date Noted  . Abdominal pain, left lower quadrant 09/28/2015  . Mild persistent asthma in adult without complication [id number]  . 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 Hydrocele, bilateral 07/12/2015  . Chest pain 06/05/2013  . Hyperlipidemia   . Hypertension     Social History  Substance Use  Topics  . Smoking status: Former Smoker -- 1.00 packs/day for 20 years    Types: Cigarettes  . Smokeless tobacco: Current User    Types: Chew     Comment: quit smoking 30 years  . Alcohol Use: 0.0 oz/week    0 Standard drinks or equivalent per week     Comment: wine     Current outpatient prescriptions:  .  BENICAR 20 MG tablet, TAKE 1 TABLET BY MOUTH EVERY DAY, Disp: 90 tablet, Rfl: 3 .  budesonide-formoterol (SYMBICORT) 160-4.5 MCG/ACT inhaler, Inhale 2 puffs into the lungs 2 (two) times daily., Disp: 30.6 g, Rfl: 1 .  finasteride (PROSCAR) 5 MG tablet, Take 1 tablet (5 mg total) by mouth daily., Disp: 90 tablet, Rfl: 3 .  FLUoxetine (PROZAC) 20 MG capsule, TAKE ONE CAPSULE BY MOUTH EVERY DAY, Disp: 90 capsule, Rfl: 0 .  PROVENTIL HFA 108 (90 BASE) MCG/ACT inhaler, INHALE 2 PUFFS EVERY 4-6 HOURS AS NEEDED, Disp: 18 g, Rfl: 3  Past Surgical History  Procedure Laterality Date  . Vasectomy    . Hemorrhoidectomy with hemorrhoid banding    . Knee arthroscopy    . Inguinal hernia repair    . Carpal tunnel release Right   . Lumbar laminectomy    . Colonoscopy    . Cholecystectomy    . Hernia repair    . Cardiac catheterization  03/2009    [facility]:  No significant coronary artery disease  . Cardiac catheterization  01/2014    [facility]  . Cyst excision      Family History  Problem Relation Age of Onset  . Kidney failure Mother   . Prostate cancer Neg Hx     Allergies  Allergen Reactions  . Other     Other reaction(s): Unknown Carrots, celery     Review of Systems  CONSTITUTIONAL: No significant weight changes, fever, chills, weakness or fatigue.  SKIN: No rash or itching.  CARDIOVASCULAR: No chest pain, chest pressure or chest discomfort. No palpitations or edema.  RESPIRATORY: No shortness of breath, cough or sputum.  GASTROINTESTINAL: No anorexia, nausea, vomiting. No changes in bowel habits. No abdominal pain or blood.  GENITOURINARY: No dysuria. No frequency. No  discharge.  NEUROLOGICAL: No headache, dizziness, syncope, paralysis, ataxia, numbness or tingling in the extremities. No memory changes. No change in bowel or bladder control.  MUSCULOSKELETAL: No joint pain. No muscle pain. HEMATOLOGIC: No anemia, bleeding or bruising.  LYMPHATICS: No enlarged lymph nodes.  PSYCHIATRIC: No change in mood. No change in sleep pattern.  ENDOCRINOLOGIC: No reports of sweating, cold or heat intolerance. No polyuria or polydipsia.     Objective  BP 118/68 mmHg  Pulse 90  Temp(Src) 98.2 F (36.8 C)  Resp 20  Ht 5\' 9"  (1.753 m)  Wt 239 lb 6 oz (108.58 kg)  BMI 35.33 kg/m2  SpO2 95% Body mass index is 35.33 kg/(m^2).  Physical Exam  Constitutional: Patient remains obese and well-nourished. In no distress.  Cardiovascular: Normal rate, regular rhythm and normal heart sounds.  No murmur heard.  Pulmonary/Chest: Effort normal and breath sounds normal. No respiratory distress. Abdomen: Soft obese with normal bowel sounds and non tender.  Musculoskeletal: Normal range of motion bilateral UE and LE, no joint effusions. Peripheral vascular: Bilateral LE no edema. Neurological: CN II-XII grossly intact with no focal deficits. Alert and oriented to person, place, and time. Coordination, balance, strength, speech and gait are normal.  Skin: Skin is warm and dry. No rash noted. No erythema.  Psychiatric: Patient has a normal mood and affect. Behavior is normal in office today. Judgment and thought content normal in office today.  Assessment & Plan  1. Encounter for screening for malignant neoplasm of colon  - Ambulatory referral to General Surgery

## 2015-10-21 NOTE — Telephone Encounter (Signed)
Gastroenterology Pre-Procedure Review  Request Date: 12/06/15 Requesting Physician: Dr. Nadine Counts  PATIENT REVIEW QUESTIONS: The patient responded to the following health history questions as indicated:    1. Are you having any GI issues? no 2. Do you have a personal history of Polyps? no 3. Do you have a family history of Colon Cancer or Polyps? no 4. Diabetes Mellitus? no 5. Joint replacements in the past 12 months?no 6. Major health problems in the past 3 months?no 7. Any artificial heart valves, MVP, or defibrillator?no    MEDICATIONS & ALLERGIES:    Patient reports the following regarding taking any anticoagulation/antiplatelet therapy:   Plavix, Coumadin, Eliquis, Xarelto, Lovenox, Pradaxa, Brilinta, or Effient? no Aspirin? no  Patient confirms/reports the following medications:  Current Outpatient Prescriptions  Medication Sig Dispense Refill  . BENICAR 20 MG tablet TAKE 1 TABLET BY MOUTH EVERY DAY 90 tablet 3  . budesonide-formoterol (SYMBICORT) 160-4.5 MCG/ACT inhaler Inhale 2 puffs into the lungs 2 (two) times daily. 30.6 g 1  . finasteride (PROSCAR) 5 MG tablet Take 1 tablet (5 mg total) by mouth daily. 90 tablet 3  . FLUoxetine (PROZAC) 20 MG capsule TAKE ONE CAPSULE BY MOUTH EVERY DAY 90 capsule 0  . PROVENTIL HFA 108 (90 BASE) MCG/ACT inhaler INHALE 2 PUFFS EVERY 4-6 HOURS AS NEEDED 18 g 3   No current facility-administered medications for this visit.    Patient confirms/reports the following allergies:  Allergies  Allergen Reactions  . Other     Other reaction(s): Unknown Carrots, celery    No orders of the defined types were placed in this encounter.    AUTHORIZATION INFORMATION Primary Insurance: 1D#: Group #:  Secondary Insurance: 1D#: Group #:  SCHEDULE INFORMATION: Date: 12/06/15 Time: Location: [place]

## 2015-10-24 ENCOUNTER — Encounter: Payer: Self-pay | Admitting: *Deleted

## 2015-10-28 NOTE — Discharge Instructions (Signed)

## 2015-10-31 ENCOUNTER — Encounter
Admission: RE | Disposition: A | Discharge status: Home / Self Care | Payer: Self-pay | Source: Ambulatory Visit | Attending: Gastroenterology

## 2015-10-31 ENCOUNTER — Ambulatory Visit: Payer: Federal, State, Local not specified - PPO | Admitting: Anesthesiology

## 2015-10-31 ENCOUNTER — Encounter: Payer: Self-pay | Admitting: *Deleted

## 2015-10-31 ENCOUNTER — Ambulatory Visit
Admission: RE | Admit: 2015-10-31 | Discharge: 2015-10-31 | Disposition: A | Discharge status: Home / Self Care | Payer: Federal, State, Local not specified - PPO | Source: Ambulatory Visit | Attending: Gastroenterology | Admitting: Gastroenterology

## 2015-10-31 DIAGNOSIS — E785 Hyperlipidemia, unspecified: Secondary | ICD-10-CM | POA: Diagnosis not present

## 2015-10-31 DIAGNOSIS — J449 Chronic obstructive pulmonary disease, unspecified: Secondary | ICD-10-CM | POA: Diagnosis not present

## 2015-10-31 DIAGNOSIS — I252 Old myocardial infarction: Secondary | ICD-10-CM | POA: Diagnosis not present

## 2015-10-31 DIAGNOSIS — Z9852 Vasectomy status: Secondary | ICD-10-CM | POA: Diagnosis not present

## 2015-10-31 DIAGNOSIS — Z1211 Encounter for screening for malignant neoplasm of colon: Secondary | ICD-10-CM | POA: Diagnosis not present

## 2015-10-31 DIAGNOSIS — Z87891 Personal history of nicotine dependence: Secondary | ICD-10-CM | POA: Diagnosis not present

## 2015-10-31 DIAGNOSIS — J45909 Unspecified asthma, uncomplicated: Secondary | ICD-10-CM | POA: Diagnosis not present

## 2015-10-31 DIAGNOSIS — K219 Gastro-esophageal reflux disease without esophagitis: Secondary | ICD-10-CM | POA: Insufficient documentation

## 2015-10-31 DIAGNOSIS — F419 Anxiety disorder, unspecified: Secondary | ICD-10-CM | POA: Diagnosis not present

## 2015-10-31 DIAGNOSIS — F4310 Post-traumatic stress disorder, unspecified: Secondary | ICD-10-CM | POA: Diagnosis not present

## 2015-10-31 DIAGNOSIS — I10 Essential (primary) hypertension: Secondary | ICD-10-CM | POA: Insufficient documentation

## 2015-10-31 DIAGNOSIS — Z125 Encounter for screening for malignant neoplasm of prostate: Secondary | ICD-10-CM | POA: Insufficient documentation

## 2015-10-31 DIAGNOSIS — Z9889 Other specified postprocedural states: Secondary | ICD-10-CM | POA: Insufficient documentation

## 2015-10-31 DIAGNOSIS — Z87442 Personal history of urinary calculi: Secondary | ICD-10-CM | POA: Insufficient documentation

## 2015-10-31 DIAGNOSIS — E669 Obesity, unspecified: Secondary | ICD-10-CM | POA: Diagnosis not present

## 2015-10-31 DIAGNOSIS — Z6833 Body mass index (BMI) 33.0-33.9, adult: Secondary | ICD-10-CM | POA: Insufficient documentation

## 2015-10-31 DIAGNOSIS — Z841 Family history of disorders of kidney and ureter: Secondary | ICD-10-CM | POA: Diagnosis not present

## 2015-10-31 DIAGNOSIS — D123 Benign neoplasm of transverse colon: Secondary | ICD-10-CM | POA: Insufficient documentation

## 2015-10-31 DIAGNOSIS — N400 Enlarged prostate without lower urinary tract symptoms: Secondary | ICD-10-CM | POA: Insufficient documentation

## 2015-10-31 DIAGNOSIS — Z8719 Personal history of other diseases of the digestive system: Secondary | ICD-10-CM | POA: Insufficient documentation

## 2015-10-31 HISTORY — PX: POLYPECTOMY: SHX5525

## 2015-10-31 HISTORY — PX: COLONOSCOPY WITH PROPOFOL: SHX5780

## 2015-10-31 SURGERY — COLONOSCOPY WITH PROPOFOL
Anesthesia: Monitor Anesthesia Care

## 2015-10-31 MED ORDER — ACETAMINOPHEN 325 MG PO TABS: 325.0000 mg | ORAL_TABLET | ORAL | Status: DC | PRN

## 2015-10-31 MED ORDER — LIDOCAINE HCL (CARDIAC) 20 MG/ML IV SOLN
INTRAVENOUS | Status: DC | PRN
  Administered 2015-10-31: 50 mg via INTRAVENOUS

## 2015-10-31 MED ORDER — LACTATED RINGERS IV SOLN
INTRAVENOUS | Status: DC
  Administered 2015-10-31: 10:00:00 via INTRAVENOUS

## 2015-10-31 MED ORDER — PROPOFOL 10 MG/ML IV BOLUS
INTRAVENOUS | Status: DC | PRN
  Administered 2015-10-31: 30 mg via INTRAVENOUS
  Administered 2015-10-31: 20 mg via INTRAVENOUS
  Administered 2015-10-31: 50 mg via INTRAVENOUS
  Administered 2015-10-31: 100 mg via INTRAVENOUS
  Administered 2015-10-31: 50 mg via INTRAVENOUS

## 2015-10-31 MED ORDER — ACETAMINOPHEN 160 MG/5ML PO SOLN: 325.0000 mg | ORAL | Status: DC | PRN

## 2015-10-31 MED ORDER — STERILE WATER FOR IRRIGATION IR SOLN
Status: DC | PRN
  Administered 2015-10-31: 10:00:00

## 2015-10-31 SURGICAL SUPPLY — 21 items
CANISTER SUCT 1200ML W/VALVE (MISCELLANEOUS) ×4 IMPLANT
CLIP HMST 235XBRD CATH ROT (MISCELLANEOUS) ×2 IMPLANT
CLIP RESOLUTION 360 11X235 (MISCELLANEOUS) ×2
FCP ESCP3.2XJMB 240X2.8X (MISCELLANEOUS)
FORCEPS BIOP RAD 4 LRG CAP 4 (CUTTING FORCEPS) IMPLANT
FORCEPS BIOP RJ4 240 W/NDL (MISCELLANEOUS)
FORCEPS ESCP3.2XJMB 240X2.8X (MISCELLANEOUS) IMPLANT
GOWN CVR UNV OPN BCK APRN NK (MISCELLANEOUS) ×4 IMPLANT
GOWN ISOL THUMB LOOP REG UNIV (MISCELLANEOUS) ×4
INJECTOR VARIJECT VIN23 (MISCELLANEOUS) IMPLANT
KIT DEFENDO VALVE AND CONN (KITS) IMPLANT
KIT ENDO PROCEDURE OLY (KITS) ×4 IMPLANT
MARKER SPOT ENDO TATTOO 5ML (MISCELLANEOUS) IMPLANT
PAD GROUND ADULT SPLIT (MISCELLANEOUS) IMPLANT
PROBE APC STR FIRE (PROBE) ×4 IMPLANT
SNARE SHORT THROW 13M SML OVAL (MISCELLANEOUS) ×4 IMPLANT
SNARE SHORT THROW 30M LRG OVAL (MISCELLANEOUS) IMPLANT
SPOT EX ENDOSCOPIC TATTOO (MISCELLANEOUS)
VARIJECT INJECTOR VIN23 (MISCELLANEOUS)
WATER STERILE IRR 250ML POUR (IV SOLUTION) ×4 IMPLANT
WIDE-EYE POLYPTRAP (MISCELLANEOUS) ×4 IMPLANT

## 2015-10-31 NOTE — Transfer of Care (Signed)
Immediate Anesthesia Transfer of Care Note  Patient: Willie Crane  Procedure(s) Performed: Procedure(s): COLONOSCOPY WITH PROPOFOL (N/A) POLYPECTOMY  Patient Location: PACU  Anesthesia Type: MAC  Level of Consciousness: awake, alert  and patient cooperative  Airway and Oxygen Therapy: Patient Spontanous Breathing and Patient connected to supplemental oxygen  Post-op Assessment: Post-op Vital signs reviewed, Patient's Cardiovascular Status Stable, Respiratory Function Stable, Patent Airway and No signs of Nausea or vomiting  Post-op Vital Signs: Reviewed and stable  Complications: No apparent anesthesia complications

## 2015-10-31 NOTE — Anesthesia Preprocedure Evaluation (Signed)
Anesthesia Evaluation  Patient identified by MRN, date of birth, ID band  Reviewed: Allergy & Precautions, H&P , NPO status , Patient's Chart, lab work & pertinent test results  Airway Mallampati: II  TM Distance: >3 FB Neck ROM: full    Dental no notable dental hx.    Pulmonary COPD, former smoker,    Pulmonary exam normal        Cardiovascular hypertension, + Past MI   Rhythm:regular Rate:Normal     Neuro/Psych    GI/Hepatic GERD  ,  Endo/Other  Morbid obesity  Renal/GU      Musculoskeletal   Abdominal   Peds  Hematology   Anesthesia Other Findings   Reproductive/Obstetrics                             Anesthesia Physical Anesthesia Plan  ASA: III  Anesthesia Plan: MAC   Post-op Pain Management:    Induction:   Airway Management Planned:   Additional Equipment:   Intra-op Plan:   Post-operative Plan:   Informed Consent: I have reviewed the patients History and Physical, chart, labs and discussed the procedure including the risks, benefits and alternatives for the proposed anesthesia with the patient or authorized representative who has indicated his/her understanding and acceptance.     Plan Discussed with: CRNA  Anesthesia Plan Comments:         Anesthesia Quick Evaluation

## 2015-10-31 NOTE — Anesthesia Procedure Notes (Signed)
Procedure Name: MAC Date/Time: 10/31/2015 9:48 AM Performed by: Cameron Ali Pre-anesthesia Checklist: Patient identified, Emergency Drugs available, Suction available, Timeout performed and Patient being monitored Patient Re-evaluated:Patient Re-evaluated prior to inductionOxygen Delivery Method: Nasal cannula Placement Confirmation: positive ETCO2

## 2015-10-31 NOTE — Anesthesia Postprocedure Evaluation (Signed)
Anesthesia Post Note  Patient: Willie Crane  Procedure(s) Performed: Procedure(s) (LRB): COLONOSCOPY WITH PROPOFOL (N/A) POLYPECTOMY  Patient location during evaluation: PACU Anesthesia Type: MAC Level of consciousness: awake and alert and oriented Pain management: satisfactory to patient Vital Signs Assessment: post-procedure vital signs reviewed and stable Respiratory status: spontaneous breathing, nonlabored ventilation and respiratory function stable Cardiovascular status: blood pressure returned to baseline and stable Postop Assessment: Adequate PO intake and No signs of nausea or vomiting Anesthetic complications: no    Raliegh Ip

## 2015-10-31 NOTE — Op Note (Signed)
[facility] Gastroenterology Patient Name: Willie Crane Procedure Date: 10/31/2015 9:36 AM MRN: [id number] Account #: [id number] Date of Birth: November 17, 1945 Admit Type: Outpatient Age: 70 Room: [facility] OR ROOM 01 Gender: Male Note Status: Finalized Procedure:            Colonoscopy Indications:          Screening for colorectal malignant neoplasm Providers:            Lucilla Lame, MD Referring MD:         Bobetta Lime (Referring MD) Medicines:            Propofol per Anesthesia Complications:        No immediate complications. Procedure:            Pre-Anesthesia Assessment:                       - Prior to the procedure, a History and Physical was                        performed, and patient medications and allergies were                        reviewed. The patient's tolerance of previous                        anesthesia was also reviewed. The risks and benefits of                        the procedure and the sedation options and risks were                        discussed with the patient. All questions were                        answered, and informed consent was obtained. Prior                        Anticoagulants: The patient has taken no previous                        anticoagulant or antiplatelet agents. ASA Grade                        Assessment: III - A patient with severe systemic                        disease. After reviewing the risks and benefits, the                        patient was deemed in satisfactory condition to undergo                        the procedure.                       After obtaining informed consent, the colonoscope was                        passed under direct vision. Throughout the procedure,  the patient's blood pressure, pulse, and oxygen                        saturations were monitored continuously. The Olympus CF                        H180AL colonoscope (S#: [id number]) was introduced through                        the anus and advanced to the the cecum, identified by                        appendiceal orifice and ileocecal valve. The                        colonoscopy was performed without difficulty. The                        patient tolerated the procedure well. The quality of                        the bowel preparation was excellent. Findings:      The perianal and digital rectal examinations were normal.      Two sessile polyps were found in the transverse colon. The polyps were 2       to 7 mm in size. These polyps were removed with a cold snare. Resection       and retrieval were complete. To stop active bleeding, one hemostatic       clip was successfully placed (MR conditional). There was no bleeding at       the end of the procedure.      Non-bleeding internal hemorrhoids were found during retroflexion. The       hemorrhoids were Grade II (internal hemorrhoids that prolapse but reduce       spontaneously).      A few small-mouthed diverticula were found in the sigmoid colon. Impression:           - Two 2 to 7 mm polyps in the transverse colon, removed                        with a cold snare. Resected and retrieved. Clip (MR                        conditional) was placed. Recommendation:       - Await pathology results.                       - Repeat colonoscopy in 5 years if polyp adenoma and 10                        years if hyperplastic Procedure Code(s):    --- Professional ---                       [phone], Colonoscopy, flexible; with removal of tumor(s),                        polyp(s), or other lesion(s) by snare technique Diagnosis Code(s):    --- Professional ---  Z12.11, Encounter for screening for malignant neoplasm                        of colon                       D12.3, Benign neoplasm of transverse colon (hepatic                        flexure or splenic flexure) CPT copyright 2016 American Medical Association. All rights  reserved. The codes documented in this report are preliminary and upon coder review may  be revised to meet current compliance requirements. Lucilla Lame, MD 10/31/2015 10:09:23 AM This report has been signed electronically. Number of Addenda: 0 Note Initiated On: 10/31/2015 9:36 AM Scope Withdrawal Time: 0 hours 9 minutes 10 seconds  Total Procedure Duration: 0 hours 11 minutes 44 seconds       [facility]

## 2015-10-31 NOTE — H&P (Signed)
[facility]  [address], [place], [place] [address] Phone: [phone] Fax : [phone]  Primary Care Physician:  Bobetta Lime, MD Primary Gastroenterologist:  Dr. Allen Norris  Pre-Procedure History & Physical: HPI:  Willie Crane is a 70 y.o. male is here for a screening colonoscopy.   Past Medical History  Diagnosis Date  . PTSD (post-traumatic stress disorder)   . COPD (chronic obstructive pulmonary disease) ([place])   . MI (myocardial infarction) ([employer])   . Asthma   . Atherosclerosis   . Other and unspecified disc disorder of cervical region   . Benign paroxysmal positional vertigo   . Thoracic or lumbosacral neuritis or radiculitis, unspecified   . Obesity, unspecified   . Insomnia, unspecified   . Unspecified congenital anomaly of the integument   . Pruritus ani   . Actinic keratosis   . Degeneration of lumbar or lumbosacral intervertebral disc   . Esophageal reflux   . Unspecified gastritis and gastroduodenitis without mention of hemorrhage   . Inguinal hernia without mention of obstruction or gangrene, unilateral or unspecified, (not specified as recurrent)   . Pain in joint, lower leg   . Edema   . Acute laryngitis, without mention of obstruction   . Hyperlipidemia   . Hypertension   . Diverticulitis   . History of kidney stones   . Anxiety   . Testicular swelling   . BPH (benign prostatic hypertrophy)   . Hydrocele     Past Surgical History  Procedure Laterality Date  . Vasectomy    . Hemorrhoidectomy with hemorrhoid banding    . Knee arthroscopy    . Inguinal hernia repair    . Carpal tunnel release Right   . Lumbar laminectomy    . Colonoscopy    . Cholecystectomy    . Hernia repair    . Cardiac catheterization  03/2009    [facility]: No significant coronary artery disease  . Cardiac catheterization  01/2014    [facility]  . Cyst excision      Prior to Admission medications   Medication Sig Start Date End Date Taking? Authorizing  Provider  Ascorbic Acid (VITAMIN C PO) Take by mouth.   Yes Historical Provider, MD  BENICAR 20 MG tablet TAKE 1 TABLET BY MOUTH EVERY DAY 06/07/15  Yes Bobetta Lime, MD  budesonide-formoterol (SYMBICORT) 160-4.5 MCG/ACT inhaler Inhale 2 puffs into the lungs 2 (two) times daily. 07/12/15  Yes Bobetta Lime, MD  Cholecalciferol (VITAMIN D-3 PO) Take by mouth.   Yes Historical Provider, MD  finasteride (PROSCAR) 5 MG tablet Take 1 tablet (5 mg total) by mouth daily. 07/11/15  Yes Shannon A McGowan, PA-C  FLUoxetine (PROZAC) 20 MG capsule TAKE ONE CAPSULE BY MOUTH EVERY DAY 08/02/15  Yes Ashok Norris, MD  Multiple Vitamin (MULTIVITAMIN) capsule Take 1 capsule by mouth daily.   Yes Historical Provider, MD  PROVENTIL HFA 108 (90 BASE) MCG/ACT inhaler INHALE 2 PUFFS EVERY 4-6 HOURS AS NEEDED 06/10/15  Yes Bobetta Lime, MD  VITAMIN A PO Take by mouth.   Yes Historical Provider, MD    Allergies as of 10/21/2015 - Review Complete 10/21/2015  Allergen Reaction Noted  . Other  02/04/2014    Family History  Problem Relation Age of Onset  . Kidney failure Mother   . Prostate cancer Neg Hx     Social History   Social History  . Marital Status: Married    Spouse Name: N/A  . Number of Children: N/A  .  Years of Education: N/A   Occupational History  . Not on file.   Social History Main Topics  . Smoking status: Former Smoker -- 1.00 packs/day for 20 years    Types: Cigarettes  . Smokeless tobacco: Current User    Types: Chew     Comment: quit smoking 30 years  . Alcohol Use: 0.0 oz/week    0 Standard drinks or equivalent per week     Comment: wine 1x/mo  . Drug Use: No  . Sexual Activity: Not on file   Other Topics Concern  . Not on file   Social History Narrative    Review of Systems: See HPI, otherwise negative ROS  Physical Exam: BP 158/69 mmHg  Pulse 100  Temp(Src) 98.2 F (36.8 C)  Resp 18  Ht 5\' 9"  (1.753 m)  Wt 228 lb (103.42 kg)  BMI 33.65 kg/m2  SpO2  96% General:   Alert,  pleasant and cooperative in NAD Head:  Normocephalic and atraumatic. Neck:  Supple; no masses or thyromegaly. Lungs:  Clear throughout to auscultation.    Heart:  Regular rate and rhythm. Abdomen:  Soft, nontender and nondistended. Normal bowel sounds, without guarding, and without rebound.   Neurologic:  Alert and  oriented x4;  grossly normal neurologically.  Impression/Plan: Willie Crane is now here to undergo a screening colonoscopy.  Risks, benefits, and alternatives regarding colonoscopy have been reviewed with the patient.  Questions have been answered.  All parties agreeable.

## 2015-11-01 ENCOUNTER — Encounter: Payer: Self-pay | Admitting: Gastroenterology

## 2015-11-02 ENCOUNTER — Encounter: Payer: Self-pay | Admitting: Gastroenterology

## 2015-11-07 ENCOUNTER — Encounter: Payer: Self-pay | Admitting: Gastroenterology

## 2015-12-26 ENCOUNTER — Ambulatory Visit (INDEPENDENT_AMBULATORY_CARE_PROVIDER_SITE_OTHER): Payer: Federal, State, Local not specified - PPO | Admitting: Family Medicine

## 2015-12-26 ENCOUNTER — Encounter: Payer: Self-pay | Admitting: Family Medicine

## 2015-12-26 VITALS — BP 144/92 | HR 95 | Temp 98.6°F | Resp 16 | Wt 238.0 lb

## 2015-12-26 DIAGNOSIS — G8929 Other chronic pain: Secondary | ICD-10-CM

## 2015-12-26 DIAGNOSIS — I10 Essential (primary) hypertension: Secondary | ICD-10-CM

## 2015-12-26 DIAGNOSIS — E785 Hyperlipidemia, unspecified: Secondary | ICD-10-CM | POA: Diagnosis not present

## 2015-12-26 DIAGNOSIS — Z5181 Encounter for therapeutic drug level monitoring: Secondary | ICD-10-CM

## 2015-12-26 DIAGNOSIS — K4090 Unilateral inguinal hernia, without obstruction or gangrene, not specified as recurrent: Secondary | ICD-10-CM | POA: Diagnosis not present

## 2015-12-26 DIAGNOSIS — N433 Hydrocele, unspecified: Secondary | ICD-10-CM

## 2015-12-26 DIAGNOSIS — R1031 Right lower quadrant pain: Secondary | ICD-10-CM | POA: Diagnosis not present

## 2015-12-26 DIAGNOSIS — E669 Obesity, unspecified: Secondary | ICD-10-CM

## 2015-12-26 DIAGNOSIS — R1032 Left lower quadrant pain: Secondary | ICD-10-CM

## 2015-12-26 NOTE — Patient Instructions (Addendum)
Check out the information at familydoctor.org entitled "Nutrition for Weight Loss: What You Need to Know about Fad Diets" Try to lose between 1-2 pounds per week by taking in fewer calories and burning off more calories You can succeed by limiting portions, limiting foods dense in calories and fat, becoming more active, and drinking 8 glasses of water a day (64 ounces) Don't skip meals, especially breakfast, as skipping meals may alter your metabolism Do not use over-the-counter weight loss pills or gimmicks that claim rapid weight loss A healthy BMI (or body mass index) is between 18.5 and 24.9 You can calculate your ideal BMI at the [place] website ClubMonetize.fr  If the swelling ever gets hard, purplish, and/or you develop fever, nausea, vomiting, go to the ER immediately  Your goal blood pressure is less than 150 mmHg on top. Try to follow the DASH guidelines (DASH stands for Dietary Approaches to Stop Hypertension) Try to limit the sodium in your diet.  Ideally, consume less than 1.5 grams (less than 1,500mg ) per day. Do not add salt when cooking or at the table.  Check the sodium amount on labels when shopping, and choose items lower in sodium when given a choice. Avoid or limit foods that already contain a lot of sodium. Eat a diet rich in fruits and vegetables and whole grains.  DASH Eating Plan DASH stands for "Dietary Approaches to Stop Hypertension." The DASH eating plan is a healthy eating plan that has been shown to reduce high blood pressure (hypertension). Additional health benefits may include reducing the risk of type 2 diabetes mellitus, heart disease, and stroke. The DASH eating plan may also help with weight loss. WHAT DO I NEED TO KNOW ABOUT THE DASH EATING PLAN? For the DASH eating plan, you will follow these general guidelines:  Choose foods with a percent daily value for sodium of less than 5% (as listed on the food  label).  Use salt-free seasonings or herbs instead of table salt or sea salt.  Check with your health care provider or pharmacist before using salt substitutes.  Eat lower-sodium products, often labeled as "lower sodium" or "no salt added."  Eat fresh foods.  Eat more vegetables, fruits, and low-fat dairy products.  Choose whole grains. Look for the word "whole" as the first word in the ingredient list.  Choose fish and skinless chicken or [place] more often than red meat. Limit fish, poultry, and meat to 6 oz (170 g) each day.  Limit sweets, desserts, sugars, and sugary drinks.  Choose heart-healthy fats.  Limit cheese to 1 oz (28 g) per day.  Eat more home-cooked food and less restaurant, buffet, and fast food.  Limit fried foods.  Cook foods using methods other than frying.  Limit canned vegetables. If you do use them, rinse them well to decrease the sodium.  When eating at a restaurant, ask that your food be prepared with less salt, or no salt if possible. WHAT FOODS CAN I EAT? Seek help from a dietitian for individual calorie needs. Grains Whole grain or whole wheat bread. Brown rice. Whole grain or whole wheat pasta. Quinoa, bulgur, and whole grain cereals. Low-sodium cereals. Corn or whole wheat flour tortillas. Whole grain cornbread. Whole grain crackers. Low-sodium crackers. Vegetables Fresh or frozen vegetables (raw, steamed, roasted, or grilled). Low-sodium or reduced-sodium tomato and vegetable juices. Low-sodium or reduced-sodium tomato sauce and paste. Low-sodium or reduced-sodium canned vegetables.  Fruits All fresh, canned (in natural juice), or frozen fruits. Meat and Other Protein  Products Ground beef (85% or leaner), grass-fed beef, or beef trimmed of fat. Skinless chicken or [place]. Ground chicken or [place]. Pork trimmed of fat. All fish and seafood. Eggs. Dried beans, peas, or lentils. Unsalted nuts and seeds. Unsalted canned beans. Dairy Low-fat dairy  products, such as skim or 1% milk, 2% or reduced-fat cheeses, low-fat ricotta or cottage cheese, or plain low-fat yogurt. Low-sodium or reduced-sodium cheeses. Fats and Oils Tub margarines without trans fats. Light or reduced-fat mayonnaise and salad dressings (reduced sodium). Avocado. Safflower, olive, or canola oils. Natural peanut or almond butter. Other Unsalted popcorn and pretzels. The items listed above may not be a complete list of recommended foods or beverages. Contact your dietitian for more options. WHAT FOODS ARE NOT RECOMMENDED? Grains White bread. White pasta. White rice. Refined cornbread. Bagels and croissants. Crackers that contain trans fat. Vegetables Creamed or fried vegetables. Vegetables in a cheese sauce. Regular canned vegetables. Regular canned tomato sauce and paste. Regular tomato and vegetable juices. Fruits Dried fruits. Canned fruit in light or heavy syrup. Fruit juice. Meat and Other Protein Products Fatty cuts of meat. Ribs, chicken wings, bacon, sausage, bologna, salami, chitterlings, fatback, hot dogs, bratwurst, and packaged luncheon meats. Salted nuts and seeds. Canned beans with salt. Dairy Whole or 2% milk, cream, half-and-half, and cream cheese. Whole-fat or sweetened yogurt. Full-fat cheeses or blue cheese. Nondairy creamers and whipped toppings. Processed cheese, cheese spreads, or cheese curds. Condiments Onion and garlic salt, seasoned salt, table salt, and sea salt. Canned and packaged gravies. Worcestershire sauce. Tartar sauce. Barbecue sauce. Teriyaki sauce. Soy sauce, including reduced sodium. Steak sauce. Fish sauce. Oyster sauce. Cocktail sauce. Horseradish. Ketchup and mustard. Meat flavorings and tenderizers. Bouillon cubes. Hot sauce. Tabasco sauce. Marinades. Taco seasonings. Relishes. Fats and Oils Butter, stick margarine, lard, shortening, ghee, and bacon fat. Coconut, palm kernel, or palm oils. Regular salad dressings. Other Pickles and  olives. Salted popcorn and pretzels. The items listed above may not be a complete list of foods and beverages to avoid. Contact your dietitian for more information. WHERE CAN I FIND MORE INFORMATION? National Heart, Lung, and Blood Institute: travelstabloid.com   This information is not intended to replace advice given to you by your health care provider. Make sure you discuss any questions you have with your health care provider.   Document Released: 07/12/2011 Document Revised: 08/13/2014 Document Reviewed: 05/27/2013 Elsevier Interactive Patient Education [employer].

## 2015-12-26 NOTE — Progress Notes (Signed)
BP 144/92 mmHg  Pulse 95  Temp(Src) 98.6 F (37 C) (Oral)  Resp 16  Wt 238 lb (107.956 kg)  SpO2 94%   Subjective:    Patient ID: Willie Crane, male    DOB: 31-Jan-1946, 70 y.o.   MRN: [id number]  HPI: Willie Crane is a 70 y.o. male  Chief Complaint  Patient presents with  . Hydrocele   Patient is new to me today, as his previous provider has left this practice; he saw her in February of this year (3 months ago today) for the same symptoms of swelling in his groin/scrotum Symptoms have progressed; the swelling is getting worse, to the point where his apparent phallus is getting smaller Patient has been to see the urologist, who said he needed to see a surgeon Swelling on the left for a year or more Little aching pain LLQ; he told previous provider about his and xrays were done in Feb No blood in the stools; he just underwent a colonoscopy in March of this year He has already had ultrasound which showed bilateral hydrocoeles  Dec 15, 2014: CLINICAL DATA: Testicular swelling for 1 year.  EXAM: SCROTAL ULTRASOUND  DOPPLER ULTRASOUND OF THE TESTICLES  TECHNIQUE: Complete ultrasound examination of the testicles, epididymis, and other scrotal structures was performed. Color and spectral Doppler ultrasound were also utilized to evaluate blood flow to the testicles.  COMPARISON: 06/14/2014  FINDINGS: Right testicle  Measurements: 5.0 x 2.9 x 3.6 cm. No mass or microlithiasis visualized.  Left testicle  Measurements: 4.4 x 3.1 x 3.5 cm. No mass or microlithiasis visualized.  Right epididymis: 5 mm epididymal cyst versus spermatocele.  Left epididymis: 5 mm epididymal cyst versus spermatocele.  Hydrocele: Small bilateral hydroceles.  Varicocele: Absent  Pulsed Doppler interrogation of both testes demonstrates normal low resistance arterial and venous waveforms bilaterally. Normal color Doppler signal.  IMPRESSION: 1. Small  bilateral hydroceles. 2. No explanation for testicular swelling. 3. Bilateral tiny epididymal cysts versus spermatoceles.  Electronically Signed  By: Abigail Miyamoto M.D.  On: 12/16/2014 08:36  He saw his usual provider in February and underwent plain films of the abdomen on Sep 28, 2015: CLINICAL DATA: Left lower quadrant pain, 6 months duration.  EXAM: ABDOMEN - 2 VIEW  COMPARISON: None.  FINDINGS: Moderate amount of gas in small and large bowel but no focally dilated loops to suggest ileus or obstruction. No free air. Surgical clips in the right upper quadrant consistent with previous cholecystectomy. Chronic calcification in the left abdomen was present in 2014. Ordinary degenerative changes affect the spine. No pelvic or hip abnormality seen.  IMPRESSION: No acute radiographic finding. No cause of left lower quadrant pain identified on these images.   Electronically Signed  By: Nelson Chimes M.D.  On: 09/28/2015 14:56  Depression screen [facility] 2/9 12/26/2015 10/21/2015 10/21/2015 09/28/2015  Decreased Interest 0 0 0 0  Down, Depressed, Hopeless 0 0 0 0  PHQ - 2 Score 0 0 0 0   Relevant past medical, surgical, family and social history reviewed Past Medical History  Diagnosis Date  . PTSD (post-traumatic stress disorder)   . COPD (chronic obstructive pulmonary disease) ([place])   . Asthma   . Benign paroxysmal positional vertigo   . Thoracic or lumbosacral neuritis or radiculitis, unspecified   . Obesity, unspecified   . Unspecified congenital anomaly of the integument   . Pruritus ani   . Actinic keratosis   . Degeneration of lumbar or lumbosacral intervertebral disc   .  Unspecified gastritis and gastroduodenitis without mention of hemorrhage   . Inguinal hernia without mention of obstruction or gangrene, unilateral or unspecified, (not specified as recurrent)   . Pain in joint, lower leg   . Hyperlipidemia   . Hypertension   . History of kidney stones   .  Anxiety   . Testicular swelling   . BPH (benign prostatic hypertrophy)   . Hydrocele   . MI (myocardial infarction) ([place])     1980s  He has a heart doctor  Past Surgical History  Procedure Laterality Date  . Vasectomy    . Knee arthroscopy    . Carpal tunnel release Right   . Lumbar laminectomy    . Colonoscopy    . Cholecystectomy    . Cardiac catheterization  03/2009    [facility]: No significant coronary artery disease  . Cardiac catheter[facility]  [facility]/2014    [facility]  . Cyst excision    . Colonoscopy with propofol N/A 10/31/2015    Procedure: COLONOSCOPY WITH PROPOFOL;  Surgeon: Lucilla Lame, MD;  Location: [place];  Service: Endoscopy;  Laterality: N/A;  . Polypectomy  10/31/2015    Procedure: POLYPECTOMY;  Surgeon: Lucilla Lame, MD;  Location: [place];  Service: Endoscopy;;  . Inguinal hernia repair Right   . Hernia repair     Family History  Problem Relation Age of Onset  . Kidney failure Mother   . Prostate cancer Neg Hx    Social History  Substance Use Topics  . Smoking status: Former Smoker -- 1.00 packs/day for 20 years    Types: Cigarettes  . Smokeless tobacco: Former Systems developer    Types: Chew     Comment: quit smoking 30 years  . Alcohol Use: 0.0 oz/week    0 Standard drinks or equivalent per week     Comment: wine 1x/mo   Interim medical history since last visit reviewed. Allergies and medications reviewed  Review of Systems Per HPI unless specifically indicated above     Objective:    BP 144/92 mmHg  Pulse 95  Temp(Src) 98.6 F (37 C) (Oral)  Resp 16  Wt 238 lb (107.956 kg)  SpO2 94%  Wt Readings from Last 3 Encounters:  12/26/15 238 lb (107.956 kg)  10/31/15 228 lb (103.42 kg)  10/21/15 239 lb 6 oz (108.58 kg)   weight confirmed with pt, fluctuates body mass index is 35.13 kg/(m^2).  Physical Exam  Constitutional: He appears well-developed and well-nourished.  obese  Cardiovascular: Normal rate.   Pulmonary/Chest: Effort normal.    Abdominal: Soft. Bowel sounds are normal. He exhibits no distension and no mass. There is no guarding.  Genitourinary: Penis normal. Right testis shows swelling. Left testis shows swelling and tenderness. No phimosis, penile erythema or penile tenderness. No discharge found.  Fullness and swelling in the groin/scrotum cause appearance of shortening of the phallus; no erythema  Neurological: He is alert.  Skin: He is not diaphoretic. No pallor.  Psychiatric: He has a normal mood and affect. His mood appears not anxious. He does not exhibit a depressed mood.    Results for orders placed or performed in visit on 07/11/15  PSA  Result Value Ref Range   Prostate Specific Ag, Serum 1.4 0.0 - 4.0 ng/mL      Assessment & Plan:   Problem List Items Addressed This Visit      Cardiovascular and Mediastinum   Hypertension    Encouraged weight loss, DASH guidelines  Genitourinary   Hydrocele, bilateral - Primary    Reviewed urologist note from Dec 2016; swelling is significantly larger; will get CT scan, and refer to surgeon per urologist's recommendation; my previous understanding was that hydrocoeles fell under the realm of urology for repair, but will be glad to refer to general surgeon; will certainly get CT scan to see if additional connective tissue disorder, herniation, lymphadenopathy, other process going on; reasons to go to ER reviewed      Relevant Orders   CT Abdomen Pelvis W Contrast   Ambulatory referral to General Surgery     Other   Chronic left lower quadrant pain    Patient has seen gastroenterologist, had colonoscopy; had plain films; will get CT scan and refer to surgeon      Relevant Orders   CBC with Differential/Platelet   UA/M w/rflx Culture, Routine   CT Abdomen Pelvis W Contrast   Ambulatory referral to General Surgery   Hyperlipidemia    Noted in problem list; after our visit, I reviewed chart to find last lipid panel; I do not see one done within the  last year; will order fasting bloodwork      Relevant Orders   Lipid Panel w/o Chol/HDL Ratio   Medication monitoring encounter    Patient is on ARB; I reviewed chart to find last creatinine, but cannot find one in the last year; will order      Relevant Orders   Comprehensive metabolic panel   Obesity    Encouraged weight loss; see after visit summary for recommendations       Other Visit Diagnoses    Hernia of scrotum        Relevant Orders    CT Abdomen Pelvis W Contrast    Ambulatory referral to General Surgery       Follow up plan: No Follow-up on file.  An after-visit summary was printed and given to the patient at [place].  Please see the patient instructions which may contain other information and recommendations beyond what is mentioned above in the assessment and plan.  Orders Placed This Encounter  Procedures  . CT Abdomen Pelvis W Contrast  . CBC with Differential/Platelet  . Comprehensive metabolic panel  . Lipid Panel w/o Chol/HDL Ratio  . UA/M w/rflx Culture, Routine  . Ambulatory referral to General Surgery

## 2015-12-27 ENCOUNTER — Telehealth: Payer: Self-pay | Admitting: Family Medicine

## 2015-12-27 ENCOUNTER — Other Ambulatory Visit: Payer: Self-pay

## 2015-12-27 DIAGNOSIS — R1032 Left lower quadrant pain: Secondary | ICD-10-CM

## 2015-12-27 DIAGNOSIS — E669 Obesity, unspecified: Secondary | ICD-10-CM | POA: Insufficient documentation

## 2015-12-27 DIAGNOSIS — Z5181 Encounter for therapeutic drug level monitoring: Secondary | ICD-10-CM | POA: Insufficient documentation

## 2015-12-27 DIAGNOSIS — N400 Enlarged prostate without lower urinary tract symptoms: Secondary | ICD-10-CM

## 2015-12-27 DIAGNOSIS — G8929 Other chronic pain: Secondary | ICD-10-CM | POA: Insufficient documentation

## 2015-12-27 MED ORDER — FLUOXETINE HCL 20 MG PO CAPS: 20.0000 mg | ORAL_CAPSULE | Freq: Every day | ORAL | Status: DC

## 2015-12-27 MED ORDER — ALBUTEROL SULFATE HFA 108 (90 BASE) MCG/ACT IN AERS: 2.0000 | INHALATION_SPRAY | RESPIRATORY_TRACT | Status: DC | PRN

## 2015-12-27 MED ORDER — OLMESARTAN MEDOXOMIL 20 MG PO TABS: 20.0000 mg | ORAL_TABLET | Freq: Every day | ORAL | Status: DC

## 2015-12-27 MED ORDER — BUDESONIDE-FORMOTEROL FUMARATE 160-4.5 MCG/ACT IN AERO: 2.0000 | INHALATION_SPRAY | Freq: Two times a day (BID) | RESPIRATORY_TRACT | Status: DC

## 2015-12-27 MED ORDER — FINASTERIDE 5 MG PO TABS: 5.0000 mg | ORAL_TABLET | Freq: Every day | ORAL | Status: DC

## 2015-12-27 NOTE — Telephone Encounter (Signed)
Please contact [facility] [address] [phone]. Patient is not able to get any of his prescriptions filled. Pharmacy told patient that it was because he was still under Dr Rutherford Nail.

## 2015-12-27 NOTE — Assessment & Plan Note (Signed)
Patient has seen gastroenterologist, had colonoscopy; had plain films; will get CT scan and refer to surgeon

## 2015-12-27 NOTE — Assessment & Plan Note (Signed)
Patient is on ARB; I reviewed chart to find last creatinine, but cannot find one in the last year; will order

## 2015-12-27 NOTE — Assessment & Plan Note (Addendum)
Reviewed urologist note from Dec 2016; swelling is significantly larger; will get CT scan, and refer to surgeon per urologist's recommendation; my previous understanding was that hydrocoeles fell under the realm of urology for repair, but will be glad to refer to general surgeon; will certainly get CT scan to see if additional connective tissue disorder, herniation, lymphadenopathy, other process going on; reasons to go to ER reviewed

## 2015-12-27 NOTE — Telephone Encounter (Signed)
Please ask patient to get several labs; as I looked through his chart, I could not find a recent cholesterol panel, liver tests, kidney tests, or urine We'll want those prior to getting the CT scan, before they give him any contrast; if you catch him after he has eaten breakfast, he can certainly go to the lab this afternoon and just skip lunch (6-8 hour fast from calories/food is all I need); he can have as much water/black coffee/diet drinks he desires before tests; thank you

## 2015-12-27 NOTE — Assessment & Plan Note (Signed)
Encouraged weight loss, DASH guidelines 

## 2015-12-27 NOTE — Assessment & Plan Note (Signed)
Noted in problem list; after our visit, I reviewed chart to find last lipid panel; I do not see one done within the last year; will order fasting bloodwork

## 2015-12-27 NOTE — Telephone Encounter (Signed)
I searched for creatinine and K+ but don't see one recently; please ask pt to have labs done at his earliest convenience; thank you

## 2015-12-27 NOTE — Assessment & Plan Note (Signed)
Encouraged weight loss; see after visit summary for recommendations

## 2015-12-28 ENCOUNTER — Encounter: Payer: Self-pay | Admitting: *Deleted

## 2015-12-28 ENCOUNTER — Other Ambulatory Visit: Payer: Self-pay

## 2015-12-28 DIAGNOSIS — Z5181 Encounter for therapeutic drug level monitoring: Secondary | ICD-10-CM

## 2015-12-28 DIAGNOSIS — R972 Elevated prostate specific antigen [PSA]: Secondary | ICD-10-CM

## 2015-12-28 DIAGNOSIS — G8929 Other chronic pain: Secondary | ICD-10-CM

## 2015-12-28 DIAGNOSIS — R1032 Left lower quadrant pain: Principal | ICD-10-CM

## 2015-12-28 DIAGNOSIS — E785 Hyperlipidemia, unspecified: Secondary | ICD-10-CM

## 2015-12-28 NOTE — Telephone Encounter (Signed)
Left pt voicemail

## 2016-01-03 NOTE — Telephone Encounter (Signed)
Please see below.

## 2016-01-04 NOTE — Telephone Encounter (Signed)
Pt.notified

## 2016-01-05 ENCOUNTER — Ambulatory Visit
Admission: RE | Admit: 2016-01-05 | Discharge: 2016-01-05 | Disposition: A | Discharge status: Home / Self Care | Payer: Federal, State, Local not specified - PPO | Source: Ambulatory Visit | Attending: Family Medicine | Admitting: Family Medicine

## 2016-01-05 ENCOUNTER — Encounter: Payer: Self-pay | Admitting: *Deleted

## 2016-01-05 DIAGNOSIS — K760 Fatty (change of) liver, not elsewhere classified: Secondary | ICD-10-CM | POA: Diagnosis not present

## 2016-01-05 DIAGNOSIS — N200 Calculus of kidney: Secondary | ICD-10-CM | POA: Diagnosis not present

## 2016-01-05 DIAGNOSIS — R1031 Right lower quadrant pain: Secondary | ICD-10-CM | POA: Insufficient documentation

## 2016-01-05 DIAGNOSIS — G8929 Other chronic pain: Secondary | ICD-10-CM | POA: Diagnosis not present

## 2016-01-05 DIAGNOSIS — K4090 Unilateral inguinal hernia, without obstruction or gangrene, not specified as recurrent: Secondary | ICD-10-CM | POA: Insufficient documentation

## 2016-01-05 DIAGNOSIS — N433 Hydrocele, unspecified: Secondary | ICD-10-CM | POA: Diagnosis not present

## 2016-01-05 LAB — COMPREHENSIVE METABOLIC PANEL
A/G RATIO: 1.8 (ref 1.2–2.2)
ALT: 30 IU/L (ref 0–44)
AST: 23 IU/L (ref 0–40)
Albumin: 4.3 g/dL (ref 3.5–4.8)
Alkaline Phosphatase: 91 IU/L (ref 39–117)
BUN/Creatinine Ratio: 14 (ref 10–24)
BUN: 14 mg/dL (ref 8–27)
Bilirubin Total: 0.2 mg/dL (ref 0.0–1.2)
CALCIUM: 9.3 mg/dL (ref 8.6–10.2)
CO2: 25 mmol/L (ref 18–29)
CREATININE: 0.99 mg/dL (ref 0.76–1.27)
Chloride: 100 mmol/L (ref 96–106)
GFR, EST AFRICAN AMERICAN: 88 mL/min/{1.73_m2} (ref >59)
GFR, EST NON AFRICAN AMERICAN: 76 mL/min/{1.73_m2} (ref >59)
Globulin, Total: 2.4 g/dL (ref 1.5–4.5)
Glucose: 91 mg/dL (ref 65–99)
POTASSIUM: 4.4 mmol/L (ref 3.5–5.2)
Sodium: 140 mmol/L (ref 134–144)
TOTAL PROTEIN: 6.7 g/dL (ref 6.0–8.5)

## 2016-01-05 LAB — CBC WITH DIFFERENTIAL/PLATELET
BASOS: 1 %
Basophils Absolute: 0 10*3/uL (ref 0.0–0.2)
EOS (ABSOLUTE): 0.3 10*3/uL (ref 0.0–0.4)
EOS: 5 %
HEMATOCRIT: 44.9 % (ref 37.5–51.0)
Hemoglobin: 15.1 g/dL (ref 12.6–17.7)
IMMATURE GRANS (ABS): 0 10*3/uL (ref 0.0–0.1)
IMMATURE GRANULOCYTES: 0 %
LYMPHS: 26 %
Lymphocytes Absolute: 1.7 10*3/uL (ref 0.7–3.1)
MCH: 29.7 pg (ref 26.6–33.0)
MCHC: 33.6 g/dL (ref 31.5–35.7)
MCV: 88 fL (ref 79–97)
Monocytes Absolute: 0.5 10*3/uL (ref 0.1–0.9)
Monocytes: 8 %
NEUTROS PCT: 60 %
Neutrophils Absolute: 4.1 10*3/uL (ref 1.4–7.0)
PLATELETS: 221 10*3/uL (ref 150–379)
RBC: 5.09 x10E6/uL (ref 4.14–5.80)
RDW: 13.2 % (ref 12.3–15.4)
WBC: 6.7 10*3/uL (ref 3.4–10.8)

## 2016-01-05 LAB — UA/M W/RFLX CULTURE, ROUTINE
Bilirubin, UA: NEGATIVE
Glucose, UA: NEGATIVE
Ketones, UA: NEGATIVE
LEUKOCYTES UA: NEGATIVE
Nitrite, UA: NEGATIVE
PH UA: 6 (ref 5.0–7.5)
Protein, UA: NEGATIVE
RBC, UA: NEGATIVE
Specific Gravity, UA: 1.018 (ref 1.005–1.030)
Urobilinogen, Ur: 0.2 mg/dL (ref 0.2–1.0)

## 2016-01-05 LAB — MICROSCOPIC EXAMINATION
BACTERIA UA: NONE SEEN
CASTS: NONE SEEN /LPF
EPITHELIAL CELLS (NON RENAL): NONE SEEN /HPF (ref 0–10)

## 2016-01-05 LAB — LIPID PANEL W/O CHOL/HDL RATIO
Cholesterol, Total: 175 mg/dL (ref 100–199)
HDL: 57 mg/dL (ref >39)
LDL Calculated: 93 mg/dL (ref 0–99)
Triglycerides: 124 mg/dL (ref 0–149)
VLDL CHOLESTEROL CAL: 25 mg/dL (ref 5–40)

## 2016-01-05 MED ORDER — IOPAMIDOL (ISOVUE-300) INJECTION 61%
125.0000 mL | Freq: Once | INTRAVENOUS | Status: AC | PRN
  Administered 2016-01-05: 125 mL via INTRAVENOUS

## 2016-01-12 ENCOUNTER — Encounter: Payer: Self-pay | Admitting: General Surgery

## 2016-01-12 ENCOUNTER — Ambulatory Visit (INDEPENDENT_AMBULATORY_CARE_PROVIDER_SITE_OTHER): Payer: Federal, State, Local not specified - PPO | Admitting: General Surgery

## 2016-01-12 VITALS — BP 130/76 | HR 72 | Resp 16 | Ht 67.0 in | Wt 237.0 lb

## 2016-01-12 DIAGNOSIS — K4090 Unilateral inguinal hernia, without obstruction or gangrene, not specified as recurrent: Secondary | ICD-10-CM

## 2016-01-12 NOTE — Progress Notes (Signed)
Patient ID: Willie Crane, male   DOB: 16-Sep-1945, 70 y.o.   MRN: [id number]  Chief Complaint  Patient presents with  . Hernia    HPI Willie Crane is a 70 y.o. male.  Patient here today for an evaluation of a hernia.  States that he has noticed it for about 12 months.   It does seem to be causing some tenderness and left testicle swelling.  No nausea, vomiting, constipation or diarrhea noted. CT scan completed 01-05-16. I have reviewed the history of present illness with the patient.  HPI  Past Medical History  Diagnosis Date  . PTSD (post-traumatic stress disorder)   . COPD (chronic obstructive pulmonary disease) ([place])   . Asthma   . Benign paroxysmal positional vertigo   . Thoracic or lumbosacral neuritis or radiculitis, unspecified   . Obesity, unspecified   . Unspecified congenital anomaly of the integument   . Pruritus ani   . Actinic keratosis   . Degeneration of lumbar or lumbosacral intervertebral disc   . Unspecified gastritis and gastroduodenitis without mention of hemorrhage   . Inguinal hernia without mention of obstruction or gangrene, unilateral or unspecified, (not specified as recurrent)   . Pain in joint, lower leg   . Hyperlipidemia   . Hypertension   . History of kidney stones   . Anxiety   . Testicular swelling   . BPH (benign prostatic hypertrophy)   . Hydrocele   . MI (myocardial infarction) ([place])     1980s    Past Surgical History  Procedure Laterality Date  . Vasectomy    . Knee arthroscopy    . Carpal tunnel release Right   . Lumbar laminectomy    . Colonoscopy    . Cholecystectomy    . Cardiac catheterization  03/2009    [facility]: No significant coronary artery disease  . [facility]  [facility]  . Cyst excision    . Colonoscopy with propofol N/A 10/31/2015    Procedure: COLONOSCOPY WITH PROPOFOL;  Surgeon: Lucilla Lame, MD;  Location: [place];  Service: Endoscopy;  Laterality: N/A;  . Polypectomy  10/31/2015   Procedure: POLYPECTOMY;  Surgeon: Lucilla Lame, MD;  Location: [place];  Service: Endoscopy;;  . Inguinal hernia repair Right   . Hernia repair      Family History  Problem Relation Age of Onset  . Kidney failure Mother   . Prostate cancer Neg Hx     Social History Social History  Substance Use Topics  . Smoking status: Former Smoker -- 1.00 packs/day for 20 years    Types: Cigarettes  . Smokeless tobacco: Former Systems developer    Types: Chew     Comment: quit smoking 30 years  . Alcohol Use: 0.0 oz/week    0 Standard drinks or equivalent per week     Comment: wine 1x/mo    Allergies  Allergen Reactions  . Other Swelling    Lips swell - Potatoes, Kiwi, carrots, celery (raw)      Current Outpatient Prescriptions  Medication Sig Dispense Refill  . albuterol (PROVENTIL HFA) 108 (90 Base) MCG/ACT inhaler Inhale 2 puffs into the lungs every 4 (four) hours as needed for wheezing or shortness of breath. 1 g 0  . Ascorbic Acid (VITAMIN C PO) Take 1-2 tablets by mouth daily.     . budesonide-formoterol (SYMBICORT) 160-4.5 MCG/ACT inhaler Inhale 2 puffs into the lungs 2 (two) times daily. 30.6 g 0  . Cholecalciferol (VITAMIN  D-3 PO) Take 1 tablet by mouth daily.     . finasteride (PROSCAR) 5 MG tablet Take 1 tablet (5 mg total) by mouth daily. 90 tablet 3  . FLUoxetine (PROZAC) 20 MG capsule Take 1 capsule (20 mg total) by mouth daily. 90 capsule 0  . Multiple Vitamin (MULTIVITAMIN) capsule Take 1 capsule by mouth daily.    Marland Kitchen olmesartan (BENICAR) 20 MG tablet Take 1 tablet (20 mg total) by mouth daily. 90 tablet 0  . VITAMIN A PO Take 1 tablet by mouth daily.      No current facility-administered medications for this visit.    Review of Systems Review of Systems  Constitutional: Negative.   Respiratory: Negative.   Cardiovascular: Negative.     Blood pressure 130/76, pulse 72, resp. rate 16, height 5\' 7"  (1.702 m), weight 237 lb (107.502 kg).  Physical Exam Physical  Exam  Constitutional: He is oriented to person, place, and time. He appears well-developed and well-nourished.  HENT:  Mouth/Throat: Oropharynx is clear and moist.  Eyes: Conjunctivae are normal. No scleral icterus.  Neck: Neck supple.  Cardiovascular: Normal rate, regular rhythm and normal heart sounds.   Pulmonary/Chest: Effort normal and breath sounds normal.  Abdominal: Soft. Normal appearance. There is no tenderness. A hernia is present. Hernia confirmed positive in the left inguinal area.  Lymphadenopathy:    He has no cervical adenopathy.  Neurological: He is alert and oriented to person, place, and time.  Skin: Skin is warm and dry.  Psychiatric: His behavior is normal.  Left inguinal hernia descends to scrotum, likely also has a small hydrocele  Data Reviewed Prior notes and CT scan.  Assessment    Left inguinal hernia.    Plan    Hernia precautions and incarceration were discussed with the patient. If they develop symptoms of an incarcerated hernia, they were encouraged to seek prompt medical attention.  I have recommended repair of the hernia using mesh on an outpatient basis in the near future. The risk of infection was reviewed. The role of prosthetic mesh to minimize the risk of recurrence was reviewed.     The patient is scheduled for surgery at [facility] on 01/26/16. He will pre admit by phone. Patient is aware of date, and instructions.   PCP:  Enid Derry This information has been scribed by Karie Fetch RN, BSN,BC.   [place] Arico G 01/13/2016, 2:14 PM

## 2016-01-12 NOTE — Patient Instructions (Addendum)

## 2016-01-13 ENCOUNTER — Encounter: Payer: Self-pay | Admitting: General Surgery

## 2016-01-16 ENCOUNTER — Ambulatory Visit: Payer: Federal, State, Local not specified - PPO

## 2016-01-19 ENCOUNTER — Encounter: Payer: Self-pay | Admitting: *Deleted

## 2016-01-19 ENCOUNTER — Other Ambulatory Visit: Payer: Federal, State, Local not specified - PPO

## 2016-01-19 NOTE — Patient Instructions (Addendum)
  Your procedure is scheduled on: 01/26/16 Report to Day Surgery.  [facility] SECOND FLOOR To find out your arrival time please call [phone] between 1PM - 3PM on   01/25/16  Remember: Instructions that are not followed completely may result in serious medical risk, up to and including death, or upon the discretion of your surgeon and anesthesiologist your surgery may need to be rescheduled.    __X__ 1. Do not eat food or drink liquids after midnight. No gum chewing or hard candies.     __X__ 2. No Alcohol for 24 hours before or after surgery.   _X___ 3. Do Not Smoke For 24 Hours Prior to Your Surgery.   ____ 4. Bring all medications with you on the day of surgery if instructed.    _X___ 5. Notify your doctor if there is any change in your medical condition     (cold, fever, infections).       Do not wear jewelry, make-up, hairpins, clips or nail polish.  Do not wear lotions, powders, or perfumes. You may wear deodorant.  Do not shave 48 hours prior to surgery. Men may shave face and neck.  Do not bring valuables to the hospital.    [facility] is not responsible for any belongings or valuables.               Contacts, dentures or bridgework may not be worn into surgery.  Leave your suitcase in the car. After surgery it may be brought to your room.  For patients admitted to the hospital, discharge time is determined by your                treatment team.   Patients discharged the day of surgery will not be allowed to drive home.   Please read over the following fact sheets that you were given:   Surgical Site Infection Prevention   _X___ Take these medicines the morning of surgery with A SIP OF WATER:    1.FINASTERIDE  2. FLUOXETINE  3. OLMESARTAN  4.  5.  6.  ____ Fleet Enema (as directed)   __X__ Use CHG Soap as directed  _X_ Use inhalers on the day of surgery  ____ Stop metformin 2 days prior to surgery    ____ Take 1/2 of usual insulin dose the night before  surgery and none on the morning of surgery.   ____ Stop Coumadin/Plavix/aspirin on  ____ Stop Anti-inflammatories on   __X__ Stop supplements until after surgery.   STOP VIT C, VIT A UNTIL AFTER SURGERY   ____ Bring C-Pap to the hospital. 0

## 2016-01-23 ENCOUNTER — Encounter
Admission: RE | Admit: 2016-01-23 | Discharge: 2016-01-23 | Disposition: A | Discharge status: Home / Self Care | Payer: Federal, State, Local not specified - PPO | Source: Ambulatory Visit | Attending: General Surgery | Admitting: General Surgery

## 2016-01-23 DIAGNOSIS — Z91018 Allergy to other foods: Secondary | ICD-10-CM | POA: Diagnosis not present

## 2016-01-23 DIAGNOSIS — Z7951 Long term (current) use of inhaled steroids: Secondary | ICD-10-CM | POA: Diagnosis not present

## 2016-01-23 DIAGNOSIS — I10 Essential (primary) hypertension: Secondary | ICD-10-CM | POA: Diagnosis not present

## 2016-01-23 DIAGNOSIS — E785 Hyperlipidemia, unspecified: Secondary | ICD-10-CM | POA: Diagnosis not present

## 2016-01-23 DIAGNOSIS — K4030 Unilateral inguinal hernia, with obstruction, without gangrene, not specified as recurrent: Secondary | ICD-10-CM | POA: Diagnosis not present

## 2016-01-23 DIAGNOSIS — Z79899 Other long term (current) drug therapy: Secondary | ICD-10-CM | POA: Diagnosis not present

## 2016-01-23 DIAGNOSIS — Z87442 Personal history of urinary calculi: Secondary | ICD-10-CM | POA: Diagnosis not present

## 2016-01-23 DIAGNOSIS — F419 Anxiety disorder, unspecified: Secondary | ICD-10-CM | POA: Diagnosis not present

## 2016-01-23 DIAGNOSIS — Z6835 Body mass index (BMI) 35.0-35.9, adult: Secondary | ICD-10-CM | POA: Diagnosis not present

## 2016-01-23 DIAGNOSIS — F4310 Post-traumatic stress disorder, unspecified: Secondary | ICD-10-CM | POA: Diagnosis not present

## 2016-01-23 DIAGNOSIS — Z87891 Personal history of nicotine dependence: Secondary | ICD-10-CM | POA: Diagnosis not present

## 2016-01-23 DIAGNOSIS — N400 Enlarged prostate without lower urinary tract symptoms: Secondary | ICD-10-CM | POA: Diagnosis not present

## 2016-01-23 DIAGNOSIS — Z841 Family history of disorders of kidney and ureter: Secondary | ICD-10-CM | POA: Diagnosis not present

## 2016-01-23 DIAGNOSIS — Z9889 Other specified postprocedural states: Secondary | ICD-10-CM | POA: Diagnosis not present

## 2016-01-23 DIAGNOSIS — I252 Old myocardial infarction: Secondary | ICD-10-CM | POA: Diagnosis not present

## 2016-01-23 DIAGNOSIS — E669 Obesity, unspecified: Secondary | ICD-10-CM | POA: Diagnosis not present

## 2016-01-23 DIAGNOSIS — J449 Chronic obstructive pulmonary disease, unspecified: Secondary | ICD-10-CM | POA: Diagnosis not present

## 2016-01-23 DIAGNOSIS — Z9852 Vasectomy status: Secondary | ICD-10-CM | POA: Diagnosis not present

## 2016-01-26 ENCOUNTER — Ambulatory Visit: Payer: Federal, State, Local not specified - PPO | Admitting: Certified Registered Nurse Anesthetist

## 2016-01-26 ENCOUNTER — Encounter: Payer: Self-pay | Admitting: *Deleted

## 2016-01-26 ENCOUNTER — Observation Stay
Admission: RE | Admit: 2016-01-26 | Discharge: 2016-01-27 | Disposition: A | Discharge status: Home / Self Care | Payer: Federal, State, Local not specified - PPO | Source: Ambulatory Visit | Attending: General Surgery | Admitting: General Surgery

## 2016-01-26 ENCOUNTER — Encounter
Admission: RE | Disposition: A | Discharge status: Home / Self Care | Payer: Self-pay | Source: Ambulatory Visit | Attending: General Surgery

## 2016-01-26 DIAGNOSIS — R110 Nausea: Secondary | ICD-10-CM | POA: Diagnosis present

## 2016-01-26 DIAGNOSIS — Z87891 Personal history of nicotine dependence: Secondary | ICD-10-CM | POA: Insufficient documentation

## 2016-01-26 DIAGNOSIS — E669 Obesity, unspecified: Secondary | ICD-10-CM | POA: Insufficient documentation

## 2016-01-26 DIAGNOSIS — Z87442 Personal history of urinary calculi: Secondary | ICD-10-CM | POA: Insufficient documentation

## 2016-01-26 DIAGNOSIS — Z79899 Other long term (current) drug therapy: Secondary | ICD-10-CM | POA: Insufficient documentation

## 2016-01-26 DIAGNOSIS — Z841 Family history of disorders of kidney and ureter: Secondary | ICD-10-CM | POA: Insufficient documentation

## 2016-01-26 DIAGNOSIS — Z6835 Body mass index (BMI) 35.0-35.9, adult: Secondary | ICD-10-CM | POA: Insufficient documentation

## 2016-01-26 DIAGNOSIS — E785 Hyperlipidemia, unspecified: Secondary | ICD-10-CM | POA: Insufficient documentation

## 2016-01-26 DIAGNOSIS — Z7951 Long term (current) use of inhaled steroids: Secondary | ICD-10-CM | POA: Insufficient documentation

## 2016-01-26 DIAGNOSIS — J449 Chronic obstructive pulmonary disease, unspecified: Secondary | ICD-10-CM | POA: Insufficient documentation

## 2016-01-26 DIAGNOSIS — K4030 Unilateral inguinal hernia, with obstruction, without gangrene, not specified as recurrent: Principal | ICD-10-CM | POA: Insufficient documentation

## 2016-01-26 DIAGNOSIS — Z9852 Vasectomy status: Secondary | ICD-10-CM | POA: Insufficient documentation

## 2016-01-26 DIAGNOSIS — I10 Essential (primary) hypertension: Secondary | ICD-10-CM | POA: Insufficient documentation

## 2016-01-26 DIAGNOSIS — I252 Old myocardial infarction: Secondary | ICD-10-CM | POA: Insufficient documentation

## 2016-01-26 DIAGNOSIS — F4310 Post-traumatic stress disorder, unspecified: Secondary | ICD-10-CM | POA: Insufficient documentation

## 2016-01-26 DIAGNOSIS — N400 Enlarged prostate without lower urinary tract symptoms: Secondary | ICD-10-CM | POA: Insufficient documentation

## 2016-01-26 DIAGNOSIS — Z9889 Other specified postprocedural states: Secondary | ICD-10-CM | POA: Insufficient documentation

## 2016-01-26 DIAGNOSIS — T8859XA Other complications of anesthesia, initial encounter: Secondary | ICD-10-CM | POA: Diagnosis present

## 2016-01-26 DIAGNOSIS — K4090 Unilateral inguinal hernia, without obstruction or gangrene, not specified as recurrent: Secondary | ICD-10-CM

## 2016-01-26 DIAGNOSIS — Z91018 Allergy to other foods: Secondary | ICD-10-CM | POA: Insufficient documentation

## 2016-01-26 DIAGNOSIS — F419 Anxiety disorder, unspecified: Secondary | ICD-10-CM | POA: Insufficient documentation

## 2016-01-26 HISTORY — DX: Other complications of anesthesia, initial encounter: T88.59XA

## 2016-01-26 HISTORY — DX: Nausea with vomiting, unspecified: R11.2

## 2016-01-26 HISTORY — DX: Other specified postprocedural states: Z98.890

## 2016-01-26 HISTORY — DX: Adverse effect of unspecified anesthetic, initial encounter: T41.45XA

## 2016-01-26 HISTORY — PX: INGUINAL HERNIA REPAIR: SHX194

## 2016-01-26 SURGERY — REPAIR, HERNIA, INGUINAL, ADULT
Anesthesia: General | Laterality: Left | Wound class: Clean

## 2016-01-26 MED ORDER — MORPHINE SULFATE (PF) 2 MG/ML IV SOLN
2.0000 mg | INTRAVENOUS | Status: DC | PRN
Start: 2016-01-26 — End: 2016-01-27

## 2016-01-26 MED ORDER — LIDOCAINE HCL (PF) 1 % IJ SOLN
INTRAMUSCULAR | Status: AC
  Filled 2016-01-26: qty 30

## 2016-01-26 MED ORDER — FENTANYL CITRATE (PF) 100 MCG/2ML IJ SOLN
INTRAMUSCULAR | Status: DC | PRN
  Administered 2016-01-26: 100 ug via INTRAVENOUS

## 2016-01-26 MED ORDER — FINASTERIDE 5 MG PO TABS: 5.0000 mg | ORAL_TABLET | Freq: Every day | ORAL | Status: DC

## 2016-01-26 MED ORDER — ONDANSETRON HCL 4 MG/2ML IJ SOLN
INTRAMUSCULAR | Status: AC
  Filled 2016-01-26: qty 2

## 2016-01-26 MED ORDER — MOMETASONE FURO-FORMOTEROL FUM 200-5 MCG/ACT IN AERO
2.0000 | INHALATION_SPRAY | Freq: Two times a day (BID) | RESPIRATORY_TRACT | Status: DC
  Administered 2016-01-26: 2 via RESPIRATORY_TRACT
  Filled 2016-01-26: qty 8.8

## 2016-01-26 MED ORDER — LACTATED RINGERS IV SOLN
INTRAVENOUS | Status: DC
  Administered 2016-01-26 (×2): via INTRAVENOUS

## 2016-01-26 MED ORDER — SUCCINYLCHOLINE CHLORIDE 20 MG/ML IJ SOLN
INTRAMUSCULAR | Status: DC | PRN
  Administered 2016-01-26: 120 mg via INTRAVENOUS

## 2016-01-26 MED ORDER — PROMETHAZINE HCL 25 MG/ML IJ SOLN
12.5000 mg | Freq: Once | INTRAMUSCULAR | Status: AC
  Administered 2016-01-26: 12.5 mg via INTRAVENOUS

## 2016-01-26 MED ORDER — SCOPOLAMINE 1 MG/3DAYS TD PT72
1.0000 | MEDICATED_PATCH | TRANSDERMAL | Status: DC
  Administered 2016-01-26: 1.5 mg via TRANSDERMAL

## 2016-01-26 MED ORDER — FAMOTIDINE 20 MG PO TABS
ORAL_TABLET | ORAL | Status: AC
  Administered 2016-01-26: 20 mg via ORAL
  Filled 2016-01-26: qty 1

## 2016-01-26 MED ORDER — CEFAZOLIN SODIUM-DEXTROSE 2-4 GM/100ML-% IV SOLN
INTRAVENOUS | Status: AC
  Administered 2016-01-26: 2 g via INTRAVENOUS
  Filled 2016-01-26: qty 100

## 2016-01-26 MED ORDER — BUPIVACAINE HCL (PF) 0.5 % IJ SOLN
INTRAMUSCULAR | Status: DC | PRN
  Administered 2016-01-26: 30 mL

## 2016-01-26 MED ORDER — MIDAZOLAM HCL 2 MG/2ML IJ SOLN
INTRAMUSCULAR | Status: DC | PRN
  Administered 2016-01-26: 2 mg via INTRAVENOUS

## 2016-01-26 MED ORDER — OXYCODONE-ACETAMINOPHEN 5-325 MG PO TABS: 1.0000 | ORAL_TABLET | ORAL | Status: DC | PRN

## 2016-01-26 MED ORDER — FENTANYL CITRATE (PF) 100 MCG/2ML IJ SOLN
25.0000 ug | INTRAMUSCULAR | Status: AC | PRN
  Administered 2016-01-26 (×6): 25 ug via INTRAVENOUS

## 2016-01-26 MED ORDER — ONDANSETRON HCL 4 MG/2ML IJ SOLN
INTRAMUSCULAR | Status: AC
  Administered 2016-01-26: 4 mg via INTRAVENOUS
  Filled 2016-01-26: qty 2

## 2016-01-26 MED ORDER — IPRATROPIUM-ALBUTEROL 0.5-2.5 (3) MG/3ML IN SOLN
RESPIRATORY_TRACT | Status: AC
  Filled 2016-01-26: qty 3

## 2016-01-26 MED ORDER — FENTANYL CITRATE (PF) 100 MCG/2ML IJ SOLN
INTRAMUSCULAR | Status: AC
  Filled 2016-01-26: qty 2

## 2016-01-26 MED ORDER — FAMOTIDINE 20 MG PO TABS
20.0000 mg | ORAL_TABLET | Freq: Once | ORAL | Status: AC
  Administered 2016-01-26: 20 mg via ORAL

## 2016-01-26 MED ORDER — ALBUTEROL SULFATE (2.5 MG/3ML) 0.083% IN NEBU: 2.5000 mg | INHALATION_SOLUTION | RESPIRATORY_TRACT | Status: DC | PRN

## 2016-01-26 MED ORDER — ONDANSETRON HCL 4 MG/2ML IJ SOLN
4.0000 mg | Freq: Once | INTRAMUSCULAR | Status: AC | PRN
  Administered 2016-01-26: 4 mg via INTRAVENOUS

## 2016-01-26 MED ORDER — DEXAMETHASONE SODIUM PHOSPHATE 10 MG/ML IJ SOLN
INTRAMUSCULAR | Status: AC
  Filled 2016-01-26: qty 1

## 2016-01-26 MED ORDER — CEFAZOLIN SODIUM-DEXTROSE 2-4 GM/100ML-% IV SOLN
2.0000 g | INTRAVENOUS | Status: AC
  Administered 2016-01-26: 2 g via INTRAVENOUS

## 2016-01-26 MED ORDER — SODIUM CHLORIDE 0.9 % IJ SOLN
INTRAMUSCULAR | Status: AC
  Administered 2016-01-26: 15:00:00
  Filled 2016-01-26: qty 10

## 2016-01-26 MED ORDER — ACETAMINOPHEN 10 MG/ML IV SOLN
INTRAVENOUS | Status: DC | PRN
  Administered 2016-01-26: 1000 mg via INTRAVENOUS

## 2016-01-26 MED ORDER — ACETAMINOPHEN 10 MG/ML IV SOLN
INTRAVENOUS | Status: AC
  Filled 2016-01-26: qty 100

## 2016-01-26 MED ORDER — ROCURONIUM BROMIDE 100 MG/10ML IV SOLN
INTRAVENOUS | Status: DC | PRN
  Administered 2016-01-26: 5 mg via INTRAVENOUS
  Administered 2016-01-26 (×2): 20 mg via INTRAVENOUS

## 2016-01-26 MED ORDER — PHENYLEPHRINE HCL 10 MG/ML IJ SOLN
INTRAMUSCULAR | Status: DC | PRN
  Administered 2016-01-26: 100 ug via INTRAVENOUS
  Administered 2016-01-26 (×2): 200 ug via INTRAVENOUS
  Administered 2016-01-26: 100 ug via INTRAVENOUS
  Administered 2016-01-26: 50 ug via INTRAVENOUS
  Administered 2016-01-26: 100 ug via INTRAVENOUS

## 2016-01-26 MED ORDER — HYDROMORPHONE HCL 1 MG/ML IJ SOLN
0.5000 mg | INTRAMUSCULAR | Status: DC | PRN
  Administered 2016-01-26 (×2): 0.5 mg via INTRAVENOUS

## 2016-01-26 MED ORDER — ACETAMINOPHEN 325 MG PO TABS: 650.0000 mg | ORAL_TABLET | Freq: Four times a day (QID) | ORAL | Status: DC | PRN

## 2016-01-26 MED ORDER — DEXAMETHASONE SODIUM PHOSPHATE 10 MG/ML IJ SOLN
10.0000 mg | Freq: Once | INTRAMUSCULAR | Status: AC
  Administered 2016-01-26: 10 mg via INTRAVENOUS

## 2016-01-26 MED ORDER — SUGAMMADEX SODIUM 200 MG/2ML IV SOLN
INTRAVENOUS | Status: DC | PRN
  Administered 2016-01-26: 225 mg via INTRAVENOUS

## 2016-01-26 MED ORDER — ACETAMINOPHEN 650 MG RE SUPP
650.0000 mg | Freq: Four times a day (QID) | RECTAL | Status: DC | PRN
Start: 2016-01-26 — End: 2016-01-27

## 2016-01-26 MED ORDER — SCOPOLAMINE 1 MG/3DAYS TD PT72
MEDICATED_PATCH | TRANSDERMAL | Status: AC
  Administered 2016-01-26: 1.5 mg via TRANSDERMAL
  Filled 2016-01-26: qty 1

## 2016-01-26 MED ORDER — PROPOFOL 10 MG/ML IV BOLUS
INTRAVENOUS | Status: DC | PRN
  Administered 2016-01-26: 200 mg via INTRAVENOUS

## 2016-01-26 MED ORDER — KETOROLAC TROMETHAMINE 30 MG/ML IJ SOLN
INTRAMUSCULAR | Status: DC | PRN
  Administered 2016-01-26: 30 mg via INTRAVENOUS

## 2016-01-26 MED ORDER — IPRATROPIUM-ALBUTEROL 0.5-2.5 (3) MG/3ML IN SOLN
3.0000 mL | Freq: Once | RESPIRATORY_TRACT | Status: AC
  Administered 2016-01-26: 3 mL via RESPIRATORY_TRACT

## 2016-01-26 MED ORDER — LIDOCAINE HCL (CARDIAC) 20 MG/ML IV SOLN
INTRAVENOUS | Status: DC | PRN
  Administered 2016-01-26: 50 mg via INTRAVENOUS

## 2016-01-26 MED ORDER — CHLORHEXIDINE GLUCONATE 4 % EX LIQD: 1.0000 "application " | Freq: Once | CUTANEOUS | Status: DC

## 2016-01-26 MED ORDER — ONDANSETRON HCL 4 MG/2ML IJ SOLN
INTRAMUSCULAR | Status: DC | PRN
  Administered 2016-01-26: 4 mg via INTRAVENOUS

## 2016-01-26 MED ORDER — PROMETHAZINE HCL 25 MG/ML IJ SOLN
12.5000 mg | Freq: Once | INTRAMUSCULAR | Status: AC
  Administered 2016-01-26: 12.5 mg via INTRAVENOUS
  Filled 2016-01-26: qty 1

## 2016-01-26 MED ORDER — HYDROMORPHONE HCL 1 MG/ML IJ SOLN
INTRAMUSCULAR | Status: AC
  Administered 2016-01-26: 0.5 mg via INTRAVENOUS
  Filled 2016-01-26: qty 1

## 2016-01-26 MED ORDER — BUPIVACAINE HCL (PF) 0.5 % IJ SOLN
INTRAMUSCULAR | Status: AC
  Filled 2016-01-26: qty 30

## 2016-01-26 MED ORDER — FLUOXETINE HCL 20 MG PO CAPS: 20.0000 mg | ORAL_CAPSULE | Freq: Every day | ORAL | Status: DC

## 2016-01-26 MED ORDER — ONDANSETRON HCL 4 MG/2ML IJ SOLN
4.0000 mg | Freq: Once | INTRAMUSCULAR | Status: AC
  Administered 2016-01-26: 4 mg via INTRAVENOUS

## 2016-01-26 MED ORDER — DEXAMETHASONE SODIUM PHOSPHATE 10 MG/ML IJ SOLN
INTRAMUSCULAR | Status: DC | PRN
  Administered 2016-01-26: 10 mg via INTRAVENOUS

## 2016-01-26 MED ORDER — PROMETHAZINE HCL 25 MG/ML IJ SOLN: 12.5000 mg | Freq: Four times a day (QID) | INTRAMUSCULAR | Status: DC | PRN

## 2016-01-26 MED ORDER — ADULT MULTIVITAMIN W/MINERALS CH: 1.0000 | ORAL_TABLET | Freq: Every day | ORAL | Status: DC

## 2016-01-26 SURGICAL SUPPLY — 30 items
BLADE SURG 15 STRL SS SAFETY (BLADE) ×3 IMPLANT
CANISTER SUCT 1200ML W/VALVE (MISCELLANEOUS) ×3 IMPLANT
CHLORAPREP W/TINT 26ML (MISCELLANEOUS) ×3 IMPLANT
DECANTER SPIKE VIAL GLASS SM (MISCELLANEOUS) ×3 IMPLANT
DRAIN PENROSE 1/4X12 LTX (DRAIN) ×3 IMPLANT
DRAPE LAPAROTOMY 100X77 ABD (DRAPES) ×3 IMPLANT
ELECT REM PT RETURN 9FT ADLT (ELECTROSURGICAL) ×3
ELECTRODE REM PT RTRN 9FT ADLT (ELECTROSURGICAL) ×1 IMPLANT
GLOVE BIO SURGEON STRL SZ7 (GLOVE) ×15 IMPLANT
GOWN STRL REUS W/ TWL LRG LVL3 (GOWN DISPOSABLE) ×3 IMPLANT
GOWN STRL REUS W/TWL LRG LVL3 (GOWN DISPOSABLE) ×6
KIT RM TURNOVER STRD PROC AR (KITS) ×3 IMPLANT
LABEL OR SOLS (LABEL) IMPLANT
LIQUID BAND (GAUZE/BANDAGES/DRESSINGS) ×3 IMPLANT
MESH PARIETEX PROGRIP LEFT (Mesh General) ×3 IMPLANT
NDL HPO THNWL 1X22GA REG BVL (NEEDLE) IMPLANT
NEEDLE HYPO 25X1 1.5 SAFETY (NEEDLE) ×3 IMPLANT
NEEDLE SAFETY 22GX1 (NEEDLE)
NS IRRIG 500ML POUR BTL (IV SOLUTION) ×3 IMPLANT
PACK BASIN MINOR ARMC (MISCELLANEOUS) ×3 IMPLANT
SUT PDS 2-0 27IN (SUTURE) ×6 IMPLANT
SUT VIC AB 2-0 BRD 54 (SUTURE) ×6 IMPLANT
SUT VIC AB 2-0 SH 27 (SUTURE) ×4
SUT VIC AB 2-0 SH 27XBRD (SUTURE) ×2 IMPLANT
SUT VIC AB 3-0 54X BRD REEL (SUTURE) ×1 IMPLANT
SUT VIC AB 3-0 BRD 54 (SUTURE) ×2
SUT VIC AB 3-0 SH 27 (SUTURE) ×2
SUT VIC AB 3-0 SH 27X BRD (SUTURE) ×1 IMPLANT
SUT VIC AB 4-0 FS2 27 (SUTURE) ×6 IMPLANT
SYR CONTROL 10ML (SYRINGE) ×3 IMPLANT

## 2016-01-26 NOTE — Discharge Instructions (Signed)

## 2016-01-26 NOTE — Anesthesia Postprocedure Evaluation (Signed)
Anesthesia Post Note  Patient: Willie Crane  Procedure(s) Performed: Procedure(s) (LRB): HERNIA REPAIR INGUINAL ADULT (Left)  Patient location during evaluation: PACU Anesthesia Type: General Level of consciousness: awake and alert Pain management: pain level controlled Vital Signs Assessment: post-procedure vital signs reviewed and stable Respiratory status: spontaneous breathing, nonlabored ventilation, respiratory function stable and patient connected to nasal cannula oxygen Cardiovascular status: blood pressure returned to baseline and stable Postop Assessment: no signs of nausea or vomiting Anesthetic complications: no    Last Vitals:  Filed Vitals:   01/26/16 1230 01/26/16 1235  BP: 113/74   Pulse: 94 94  Temp: 37.1 C   Resp: 25 20    Last Pain:  Filed Vitals:   01/26/16 1237  PainSc: 0-No pain                 Molli Barrows

## 2016-01-26 NOTE — Op Note (Signed)
Preop diagnosis: Large incarcerated left inguinal hernia  Post op diagnosis: Same  Operation: Repair left inguinal hernia  Surgeon: Mckinley Jewel R  Assistant:     Anesthesia: Gen.  Complications: None  EBL: 25  Drains: None  Description: Patient was put to sleep in supine position the operating table. The patient had a large hernia containing fat extending towards the scrotum on the left side. This area was prepped and draped as sterile field and timeout was performed. Standard incision was made along the inguinal canal in the medial two thirds. 30 mL of 0.5% Marcaine was instilled surrounding the area of for postop analgesia. The incision was carried down through the subcutaneous tissue down the external oblique. Bleeding was controlled cautery and ligatures of 3-0 Vicryl. The external oblique was then opened along the line of its fibers through the external ring. The cord was noted be significantly thickened and this was first freed from the posterior aspect and a Penrose drain used to control. Fascia over the cord was then incised to reveal the patient had an enormous amount of fatty tissue that had herniated through the internal ring area similar to when indirect hernia but did not have a peritoneal sac this large portion of the fatty tissue was likely preperitoneal fat but it did  appear to be somewhat excessive. This was delineated down to the internal ring area and then taken down with the clamps cut and ligated with 2-0 Vicryl the excessive portion of fatty tissue that was herniated was sent to pathology. Also noted was a similar along fatty tissue herniating through a defect just medial to the cord structures this was similarly ligated and amputated and pushed back in. Following this the posterior wall was reinforced the with plicating stitch of 2-0 Vicryl which thereby narrowed the internal ring. The vas deference and the vasculature and the cord structures were all preserved.  Ilioinguinal nerve was not identified in the course of dissection. A Progrip mesh was then placed around the cord and laid down against the posterior wall. The lateral ends were tucked underneath the external oblique. Medial and was tacked to the pubic tubercle with a single stitch of 2-0 PDS. Wound was irrigated external oblique closed with a running 2-0 PDS. This Scarpa's fascia closed with running 2-0 Vicryl. Skin closed with subcuticular 4-0 Vicryl and covered with liqui  ban. Patient subsequently was returned recovery room stable condition

## 2016-01-26 NOTE — Anesthesia Procedure Notes (Signed)
Procedure Name: Intubation Date/Time: 01/26/2016 10:46 AM Performed by: Johnna Acosta Pre-anesthesia Checklist: Patient identified, Emergency Drugs available, Suction available, Patient being monitored and Timeout performed Patient Re-evaluated:Patient Re-evaluated prior to inductionOxygen Delivery Method: Circle system utilized Preoxygenation: Pre-oxygenation with 100% oxygen Intubation Type: IV induction Ventilation: Mask ventilation with difficulty and Oral airway inserted - appropriate to patient size Laryngoscope Size: Sabra Heck and 2 Grade View: Grade I Tube type: Oral Tube size: 7.5 mm Number of attempts: 1 Airway Equipment and Method: Stylet Placement Confirmation: ETT inserted through vocal cords under direct vision,  positive ETCO2 and breath sounds checked- equal and bilateral Secured at: 23 cm Tube secured with: Tape Dental Injury: Teeth and Oropharynx as per pre-operative assessment  Difficulty Due To: Difficult Airway- due to limited oral opening and Difficult Airway- due to large tongue Future Recommendations: Recommend- induction with short-acting agent, and alternative techniques readily available

## 2016-01-26 NOTE — Addendum Note (Signed)
Addendum  created 01/26/16 1253 by Johnna Acosta, CRNA   Modules edited: Charges VN

## 2016-01-26 NOTE — Interval H&P Note (Signed)
History and Physical Interval Note:  01/26/2016 10:26 AM  Willie Crane  has presented today for surgery, with the diagnosis of left inguinal hernia  The various methods of treatment have been discussed with the patient and family. After consideration of risks, benefits and other options for treatment, the patient has consented to  Procedure(s): HERNIA REPAIR INGUINAL ADULT (Left) as a surgical intervention .  The patient's history has been reviewed, patient examined, no change in status, stable for surgery.  I have reviewed the patient's chart and labs.  Questions were answered to the patient's satisfaction.     Corley Kohls G

## 2016-01-26 NOTE — Progress Notes (Signed)
Patient was in PACU x 2 hours with issues with nausea and vomiting, received multiple medications for nausea. Patient transferred to Post op area in hopes of resolving nausea, received another dose of phenergan, slept for some time, but awoke feeling same nausea and vomiting feeling. RN called MD on call, Dr. Bary Castilla to place orders for patient to be admitted with observation to control nausea.

## 2016-01-26 NOTE — Interval H&P Note (Signed)
History and Physical Interval Note:  01/26/2016 10:26 AM  Willie Crane  has presented today for surgery, with the diagnosis of left inguinal hernia  The various methods of treatment have been discussed with the patient and family. After consideration of risks, benefits and other options for treatment, the patient has consented to  Procedure(s): HERNIA REPAIR INGUINAL ADULT (Left) as a surgical intervention .  The patient's history has been reviewed, patient examined, no change in status, stable for surgery.  I have reviewed the patient's chart and labs.  Questions were answered to the patient's satisfaction.     Evah Rashid G

## 2016-01-26 NOTE — H&P (View-Only) (Signed)
Patient ID: Willie Crane, male   DOB: Jan 16, 1946, 70 y.o.   MRN: [id number]  Chief Complaint  Patient presents with  . Hernia    HPI Willie Crane is a 70 y.o. male.  Patient here today for an evaluation of a hernia.  States that he has noticed it for about 12 months.   It does seem to be causing some tenderness and left testicle swelling.  No nausea, vomiting, constipation or diarrhea noted. CT scan completed 01-05-16. I have reviewed the history of present illness with the patient.  HPI  Past Medical History  Diagnosis Date  . PTSD (post-traumatic stress disorder)   . COPD (chronic obstructive pulmonary disease) ([place])   . Asthma   . Benign paroxysmal positional vertigo   . Thoracic or lumbosacral neuritis or radiculitis, unspecified   . Obesity, unspecified   . Unspecified congenital anomaly of the integument   . Pruritus ani   . Actinic keratosis   . Degeneration of lumbar or lumbosacral intervertebral disc   . Unspecified gastritis and gastroduodenitis without mention of hemorrhage   . Inguinal hernia without mention of obstruction or gangrene, unilateral or unspecified, (not specified as recurrent)   . Pain in joint, lower leg   . Hyperlipidemia   . Hypertension   . History of kidney stones   . Anxiety   . Testicular swelling   . BPH (benign prostatic hypertrophy)   . Hydrocele   . MI (myocardial infarction) ([place])     1980s    Past Surgical History  Procedure Laterality Date  . Vasectomy    . Knee arthroscopy    . Carpal tunnel release Right   . Lumbar laminectomy    . Colonoscopy    . Cholecystectomy    . Cardiac catheterization  03/2009    [facility]: No significant coronary artery disease  . Cardiac catheterization  01/2014    [facility]  . Cyst excision    . Colonoscopy with propofol N/A 10/31/2015    Procedure: COLONOSCOPY WITH PROPOFOL;  Surgeon: Lucilla Lame, MD;  Location: [facility];  Service: Endoscopy;  Laterality: N/A;  . Polypectomy  10/31/2015   Procedure: POLYPECTOMY;  Surgeon: Lucilla Lame, MD;  Location: [facility];  Service: Endoscopy;;  . Inguinal hernia repair Right   . Hernia repair      Family History  Problem Relation Age of Onset  . Kidney failure Mother   . Prostate cancer Neg Hx     Social History Social History  Substance Use Topics  . Smoking status: Former Smoker -- 1.00 packs/day for 20 years    Types: Cigarettes  . Smokeless tobacco: Former Systems developer    Types: Chew     Comment: quit smoking 30 years  . Alcohol Use: 0.0 oz/week    0 Standard drinks or equivalent per week     Comment: wine 1x/mo    Allergies  Allergen Reactions  . Other Swelling    Lips swell - Potatoes, Kiwi, carrots, celery (raw)      Current Outpatient Prescriptions  Medication Sig Dispense Refill  . albuterol (PROVENTIL HFA) 108 (90 Base) MCG/ACT inhaler Inhale 2 puffs into the lungs every 4 (four) hours as needed for wheezing or shortness of breath. 1 g 0  . Ascorbic Acid (VITAMIN C PO) Take 1-2 tablets by mouth daily.     . budesonide-formoterol (SYMBICORT) 160-4.5 MCG/ACT inhaler Inhale 2 puffs into the lungs 2 (two) times daily. 30.6 g 0  . Cholecalciferol (VITAMIN  D-3 PO) Take 1 tablet by mouth daily.     . finasteride (PROSCAR) 5 MG tablet Take 1 tablet (5 mg total) by mouth daily. 90 tablet 3  . FLUoxetine (PROZAC) 20 MG capsule Take 1 capsule (20 mg total) by mouth daily. 90 capsule 0  . Multiple Vitamin (MULTIVITAMIN) capsule Take 1 capsule by mouth daily.    Marland Kitchen olmesartan (BENICAR) 20 MG tablet Take 1 tablet (20 mg total) by mouth daily. 90 tablet 0  . VITAMIN A PO Take 1 tablet by mouth daily.      No current facility-administered medications for this visit.    Review of Systems Review of Systems  Constitutional: Negative.   Respiratory: Negative.   Cardiovascular: Negative.     Blood pressure 130/76, pulse 72, resp. rate 16, height 5\' 7"  (1.702 m), weight 237 lb (107.502 kg).  Physical Exam Physical  Exam  Constitutional: He is oriented to person, place, and time. He appears well-developed and well-nourished.  HENT:  Mouth/Throat: Oropharynx is clear and moist.  Eyes: Conjunctivae are normal. No scleral icterus.  Neck: Neck supple.  Cardiovascular: Normal rate, regular rhythm and normal heart sounds.   Pulmonary/Chest: Effort normal and breath sounds normal.  Abdominal: Soft. Normal appearance. There is no tenderness. A hernia is present. Hernia confirmed positive in the left inguinal area.  Lymphadenopathy:    He has no cervical adenopathy.  Neurological: He is alert and oriented to person, place, and time.  Skin: Skin is warm and dry.  Psychiatric: His behavior is normal.  Left inguinal hernia descends to scrotum, likely also has a small hydrocele  Data Reviewed Prior notes and CT scan.  Assessment    Left inguinal hernia.    Plan    Hernia precautions and incarceration were discussed with the patient. If they develop symptoms of an incarcerated hernia, they were encouraged to seek prompt medical attention.  I have recommended repair of the hernia using mesh on an outpatient basis in the near future. The risk of infection was reviewed. The role of prosthetic mesh to minimize the risk of recurrence was reviewed.     The patient is scheduled for surgery at [facility] on 01/26/16. He will pre admit by phone. Patient is aware of date, and instructions.   PCP:  Enid Derry This information has been scribed by Karie Fetch RN, BSN,BC.   Macrina Lehnert G 01/13/2016, 2:14 PM

## 2016-01-26 NOTE — Progress Notes (Signed)
Patient with nausea post op refractory to decadron, zofran, phenergan and scopalamine.  Will place in observation.

## 2016-01-26 NOTE — Transfer of Care (Signed)
Immediate Anesthesia Transfer of Care Note  Patient: Willie Crane  Procedure(s) Performed: Procedure(s): HERNIA REPAIR INGUINAL ADULT (Left)  Patient Location: PACU  Anesthesia Type:General  Level of Consciousness: sedated  Airway & Oxygen Therapy: Patient Spontanous Breathing and Patient connected to face mask oxygen  Post-op Assessment: Report given to RN and Post -op Vital signs reviewed and stable  Post vital signs: Reviewed and stable  Last Vitals:  Filed Vitals:   01/26/16 0935  BP: 141/94  Pulse: 89  Temp: 36.6 C  Resp: 16    Last Pain:  Filed Vitals:   01/26/16 0938  PainSc: 0-No pain         Complications: No apparent anesthesia complications

## 2016-01-26 NOTE — Anesthesia Preprocedure Evaluation (Addendum)
Anesthesia Evaluation  Patient identified by MRN, date of birth, ID band Patient awake    Reviewed: Allergy & Precautions, NPO status , Patient's Chart, lab work & pertinent test results, reviewed documented beta blocker date and time   History of Anesthesia Complications (+) PONV and history of anesthetic complications  Airway Mallampati: III  TM Distance: >3 FB     Dental  (+) Chipped   Pulmonary asthma , COPD, former smoker,           Cardiovascular hypertension, Pt. on medications + Past MI       Neuro/Psych PSYCHIATRIC DISORDERS Anxiety    GI/Hepatic   Endo/Other    Renal/GU      Musculoskeletal  (+) Arthritis ,   Abdominal   Peds  Hematology   Anesthesia Other Findings PTSD. Obese. EKG checked and normal.  Reproductive/Obstetrics                           Anesthesia Physical Anesthesia Plan  ASA: III  Anesthesia Plan: General   Post-op Pain Management:    Induction: Intravenous  Airway Management Planned: Oral ETT  Additional Equipment:   Intra-op Plan:   Post-operative Plan:   Informed Consent: I have reviewed the patients History and Physical, chart, labs and discussed the procedure including the risks, benefits and alternatives for the proposed anesthesia with the patient or authorized representative who has indicated his/her understanding and acceptance.     Plan Discussed with: CRNA  Anesthesia Plan Comments:        Anesthesia Quick Evaluation

## 2016-01-27 ENCOUNTER — Encounter: Payer: Self-pay | Admitting: General Surgery

## 2016-01-27 DIAGNOSIS — K4030 Unilateral inguinal hernia, with obstruction, without gangrene, not specified as recurrent: Secondary | ICD-10-CM | POA: Diagnosis not present

## 2016-01-27 LAB — SURGICAL PATHOLOGY

## 2016-01-27 NOTE — Progress Notes (Signed)
Alert and oriented. Vital signs stable . No signs of acute distress. Discharge instructions given. Patient verbalizes understanding. No other issues noted at this time.   

## 2016-01-27 NOTE — Discharge Summary (Signed)
Pt admitted overnight because of significant post op n/v VSS. He looks very comfortable. No more n/v. Pain minimal. Left inguinal incision is clean.  OK to discharge.

## 2016-02-02 ENCOUNTER — Telehealth: Payer: Self-pay | Admitting: *Deleted

## 2016-02-02 NOTE — Telephone Encounter (Signed)
He called to let us know that his testicles are swollen since his most recent hernia surgery. Advised normal response, recommend elevation and ice to the area and a scrotal support. He states it does seem to be better than it was but wanted to make sure it was an respected response . The patient is aware to call back for any questions, changes or new concerns, pt agrees. F/U is next week with MD.

## 2016-02-09 ENCOUNTER — Encounter: Payer: Self-pay | Admitting: General Surgery

## 2016-02-09 ENCOUNTER — Ambulatory Visit (INDEPENDENT_AMBULATORY_CARE_PROVIDER_SITE_OTHER): Payer: Federal, State, Local not specified - PPO | Admitting: General Surgery

## 2016-02-09 VITALS — BP 142/80 | HR 72 | Resp 14 | Ht 67.0 in | Wt 235.0 lb

## 2016-02-09 DIAGNOSIS — K4090 Unilateral inguinal hernia, without obstruction or gangrene, not specified as recurrent: Secondary | ICD-10-CM

## 2016-02-09 NOTE — Patient Instructions (Addendum)
Patient can slowly increase activity as tolerated-use caution with more vigorous activity.

## 2016-02-09 NOTE — Progress Notes (Signed)
Patient ID: Willie Crane, male   DOB: March 22, 1946, 70 y.o.   MRN: [id number]  Patient here for a post op visit for a left inguinal hernia done on 01/26/16. Patient states he did not need the pain medication, he took tylenol as needed. He states the area is still swollen and reports mild pain with urination.  I have reviewed the history of present illness with the patient.    Incision left groin healing well. No erythema. No hernia present with Valsalva.  Mild edema in the scrotum and surrounding penis-will decrease with time.  No ecchymosis noted at this time.   Patient may increased activity as tolerated. Follow-up in 4 weeks.    This information has been scribed by Verlene Mayer, CMA    PCP: Dr. Sanda Klein

## 2016-03-01 ENCOUNTER — Encounter: Payer: Self-pay | Admitting: *Deleted

## 2016-03-08 ENCOUNTER — Encounter: Payer: Self-pay | Admitting: General Surgery

## 2016-03-08 ENCOUNTER — Ambulatory Visit (INDEPENDENT_AMBULATORY_CARE_PROVIDER_SITE_OTHER): Payer: Federal, State, Local not specified - PPO | Admitting: General Surgery

## 2016-03-08 VITALS — BP 140/86 | HR 78 | Resp 14 | Ht 67.0 in | Wt 230.0 lb

## 2016-03-08 DIAGNOSIS — K4090 Unilateral inguinal hernia, without obstruction or gangrene, not specified as recurrent: Secondary | ICD-10-CM

## 2016-03-08 NOTE — Progress Notes (Signed)
Patient ID: Willie Crane, male   DOB: September 28, 1945, 70 y.o.   MRN: [id number]  Chief Complaint  Patient presents with  . Routine Post Op    left inguinal hernia    HPI Willie Crane is a 70 y.o. male here today for a post op check of a left inguinal hernia repair done on 01/26/16. Patient states that the swelling in his scrotum has gone down. He has no new complaints. I have reviewed the history of present illness with the patient.  HPI  Past Medical History:  Diagnosis Date  . Actinic keratosis   . Anxiety   . Asthma   . Benign paroxysmal positional vertigo   . BPH (benign prostatic hypertrophy)   . Complication of anesthesia   . COPD (chronic obstructive pulmonary disease) ([place])   . Degeneration of lumbar or lumbosacral intervertebral disc   . History of kidney stones   . Hydrocele   . Hyperlipidemia   . Hypertension   . Inguinal hernia without mention of obstruction or gangrene, unilateral or unspecified, (not specified as recurrent)   . MI (myocardial infarction) ([place])    1980s  . Obesity, unspecified   . Pain in joint, lower leg   . PONV (postoperative nausea and vomiting)   . Pruritus ani   . PTSD (post-traumatic stress disorder)   . Testicular swelling   . Thoracic or lumbosacral neuritis or radiculitis, unspecified   . Unspecified congenital anomaly of the integument   . Unspecified gastritis and gastroduodenitis without mention of hemorrhage     Past Surgical History:  Procedure Laterality Date  . BACK SURGERY    . CARDIAC CATHETERIZATION  03/2009   [facility]: No significant coronary artery disease  . CARDIAC CATHETERIZATION  01/2014   [facility]  . CARPAL TUNNEL RELEASE Right   . CHOLECYSTECTOMY    . COLONOSCOPY    . COLONOSCOPY WITH PROPOFOL N/A 10/31/2015   Procedure: COLONOSCOPY WITH PROPOFOL;  Surgeon: Lucilla Lame, MD;  Location: [facility];  Service: Endoscopy;  Laterality: N/A;  . CYST EXCISION    . HERNIA REPAIR    . INGUINAL HERNIA REPAIR Right    . INGUINAL HERNIA REPAIR Left 01/26/2016   Procedure: HERNIA REPAIR INGUINAL ADULT;  Surgeon: Christene Lye, MD;  Location: [facility];  Service: General;  Laterality: Left;  . KNEE ARTHROSCOPY    . LUMBAR LAMINECTOMY    . POLYPECTOMY  10/31/2015   Procedure: POLYPECTOMY;  Surgeon: Lucilla Lame, MD;  Location: [facility];  Service: Endoscopy;;  . VASECTOMY      Family History  Problem Relation Age of Onset  . Kidney failure Mother   . Prostate cancer Neg Hx     Social History Social History  Substance Use Topics  . Smoking status: Former Smoker    Packs/day: 1.00    Years: 20.00    Types: Cigarettes    Quit date: 01/19/1979  . Smokeless tobacco: Former Systems developer    Types: Chew     Comment: quit smoking 30 years  . Alcohol use 0.0 oz/week     Comment: wine 1x/mo    Allergies  Allergen Reactions  . Other Swelling    Lips swell - Potatoes, Kiwi, carrots, celery (raw)      Current Outpatient Prescriptions  Medication Sig Dispense Refill  . albuterol (PROVENTIL HFA) 108 (90 Base) MCG/ACT inhaler Inhale 2 puffs into the lungs every 4 (four) hours as needed for wheezing or shortness of breath. 1 Crane 0  .  Ascorbic Acid (VITAMIN C PO) Take 1-2 tablets by mouth daily.     . budesonide-formoterol (SYMBICORT) 160-4.5 MCG/ACT inhaler Inhale 2 puffs into the lungs 2 (two) times daily. 30.6 Crane 0  . Cholecalciferol (VITAMIN D-3 PO) Take 1 tablet by mouth daily.     . finasteride (PROSCAR) 5 MG tablet Take 1 tablet (5 mg total) by mouth daily. 90 tablet 3  . FLUoxetine (PROZAC) 20 MG capsule Take 1 capsule (20 mg total) by mouth daily. 90 capsule 0  . Multiple Vitamin (MULTIVITAMIN) capsule Take 1 capsule by mouth daily.    Marland Kitchen olmesartan (BENICAR) 20 MG tablet Take 1 tablet (20 mg total) by mouth daily. 90 tablet 0  . oxyCODONE-acetaminophen (ROXICET) 5-325 MG tablet Take 1 tablet by mouth every 4 (four) hours as needed. 30 tablet 0  . VITAMIN A PO Take 1 tablet by mouth daily.       No current facility-administered medications for this visit.     Review of Systems Review of Systems  Constitutional: Negative.   Respiratory: Negative.   Cardiovascular: Negative.     Blood pressure 140/86, pulse 78, resp. rate 14, height 5\' 7"  (1.702 m), weight 230 lb (104.3 kg).  Physical Exam Physical Exam  Constitutional: He is oriented to person, place, and time. He appears well-developed and well-nourished.  Abdominal: Soft. Normal appearance and bowel sounds are normal. There is no tenderness. No hernia.    Left inguinal hernia intact and healing well.   Neurological: He is alert and oriented to person, place, and time.  Skin: Skin is warm and dry.    Data Reviewed Prior notes  Assessment     Left inguinal hernia, repaired. Recovered fully.   Plan    Patient has been cleared to return to his normal daily activities. Patient to return as needed.    This information has been scribed by Gaspar Cola CMA.    Willie Crane 03/08/2016, 2:43 PM

## 2016-03-08 NOTE — Patient Instructions (Signed)
Patient to return as needed. 

## 2016-04-17 ENCOUNTER — Other Ambulatory Visit: Payer: Self-pay

## 2016-04-17 MED ORDER — ALBUTEROL SULFATE HFA 108 (90 BASE) MCG/ACT IN AERS: 2.0000 | INHALATION_SPRAY | RESPIRATORY_TRACT | 0 refills | Status: DC | PRN

## 2016-04-23 ENCOUNTER — Telehealth: Payer: Self-pay | Admitting: Family Medicine

## 2016-04-23 DIAGNOSIS — J4540 Moderate persistent asthma, uncomplicated: Secondary | ICD-10-CM

## 2016-04-23 MED ORDER — ALBUTEROL SULFATE HFA 108 (90 BASE) MCG/ACT IN AERS: 2.0000 | INHALATION_SPRAY | RESPIRATORY_TRACT | 0 refills | Status: DC | PRN

## 2016-04-23 NOTE — Assessment & Plan Note (Signed)
Refer to pulm

## 2016-04-23 NOTE — Telephone Encounter (Signed)
Pt notified, he states he does use 3 in 3 month span

## 2016-04-23 NOTE — Telephone Encounter (Signed)
Wow, I'm glad we're referring him to pulmonologist then; new Rx sent

## 2016-04-23 NOTE — Telephone Encounter (Signed)
Please ask patient how often he is using his albuterol (Proventil); if he's using more than 1 inhalers in an entire year, that's not good control and we'd want to get him to see a lung specialist; he should definitely not be using 3 inhalers over a 3 month period; that's terrible control I'll put in a referral to a lung doctor for him

## 2016-05-03 ENCOUNTER — Ambulatory Visit (INDEPENDENT_AMBULATORY_CARE_PROVIDER_SITE_OTHER): Payer: Federal, State, Local not specified - PPO | Admitting: Internal Medicine

## 2016-05-03 ENCOUNTER — Encounter: Payer: Self-pay | Admitting: Internal Medicine

## 2016-05-03 VITALS — BP 148/96 | HR 64 | Ht 67.0 in | Wt 234.0 lb

## 2016-05-03 DIAGNOSIS — Z23 Encounter for immunization: Secondary | ICD-10-CM

## 2016-05-03 DIAGNOSIS — J449 Chronic obstructive pulmonary disease, unspecified: Secondary | ICD-10-CM

## 2016-05-03 DIAGNOSIS — G4719 Other hypersomnia: Secondary | ICD-10-CM

## 2016-05-03 MED ORDER — ALBUTEROL SULFATE HFA 108 (90 BASE) MCG/ACT IN AERS: 2.0000 | INHALATION_SPRAY | RESPIRATORY_TRACT | 0 refills | Status: DC | PRN

## 2016-05-03 MED ORDER — UMECLIDINIUM BROMIDE 62.5 MCG/INH IN AEPB: 1.0000 | INHALATION_SPRAY | Freq: Every day | RESPIRATORY_TRACT | 0 refills | Status: AC

## 2016-05-03 MED ORDER — FLUTICASONE FUROATE-VILANTEROL 200-25 MCG/INH IN AEPB: 1.0000 | INHALATION_SPRAY | Freq: Every day | RESPIRATORY_TRACT | 0 refills | Status: AC

## 2016-05-03 NOTE — Addendum Note (Signed)
Addended by: Oscar La R on: 05/03/2016 10:29 AM   Modules accepted: Orders

## 2016-05-03 NOTE — Progress Notes (Signed)
Patient ID: Willie Crane, male   DOB: March 05, 1946, 70 y.o.   MRN: [id number]   Patient seen in the office today and instructed on use of Breo and Incruse.  Patient expressed understanding and demonstrated technique.

## 2016-05-03 NOTE — Patient Instructions (Addendum)
1.start Incruse daily 2.will start BREO 200 3.albuterol as needed 4.check overnight pulse oximetry 5.check ambulating pulse ox 6.will need sleep study  Chronic Obstructive Pulmonary Disease Chronic obstructive pulmonary disease (COPD) is a common lung condition in which airflow from the lungs is limited. COPD is a general term that can be used to describe many different lung problems that limit airflow, including both chronic bronchitis and emphysema. If you have COPD, your lung function will probably never return to normal, but there are measures you can take to improve lung function and make yourself feel better. CAUSES   Smoking (common).  Exposure to secondhand smoke.  Genetic problems.  Chronic inflammatory lung diseases or recurrent infections. SYMPTOMS  Shortness of breath, especially with physical activity.  Deep, persistent (chronic) cough with a large amount of thick mucus.  Wheezing.  Rapid breaths (tachypnea).  Gray or bluish discoloration (cyanosis) of the skin, especially in your fingers, toes, or lips.  Fatigue.  Weight loss.  Frequent infections or episodes when breathing symptoms become much worse (exacerbations).  Chest tightness. DIAGNOSIS Your health care provider will take a medical history and perform a physical examination to diagnose COPD. Additional tests for COPD may include:  Lung (pulmonary) function tests.  Chest X-ray.  CT scan.  Blood tests. TREATMENT  Treatment for COPD may include:  Inhaler and nebulizer medicines. These help manage the symptoms of COPD and make your breathing more comfortable.  Supplemental oxygen. Supplemental oxygen is only helpful if you have a low oxygen level in your blood.  Exercise and physical activity. These are beneficial for nearly all people with COPD.  Lung surgery or transplant.  Nutrition therapy to gain weight, if you are underweight.  Pulmonary rehabilitation. This may involve working with  a team of health care providers and specialists, such as respiratory, occupational, and physical therapists. HOME CARE INSTRUCTIONS  Take all medicines (inhaled or pills) as directed by your health care provider.  Avoid over-the-counter medicines or cough syrups that dry up your airway (such as antihistamines) and slow down the elimination of secretions unless instructed otherwise by your health care provider.  If you are a smoker, the most important thing that you can do is stop smoking. Continuing to smoke will cause further lung damage and breathing trouble. Ask your health care provider for help with quitting smoking. He or she can direct you to community resources or hospitals that provide support.  Avoid exposure to irritants such as smoke, chemicals, and fumes that aggravate your breathing.  Use oxygen therapy and pulmonary rehabilitation if directed by your health care provider. If you require home oxygen therapy, ask your health care provider whether you should purchase a pulse oximeter to measure your oxygen level at home.  Avoid contact with individuals who have a contagious illness.  Avoid extreme temperature and humidity changes.  Eat healthy foods. Eating smaller, more frequent meals and resting before meals may help you maintain your strength.  Stay active, but balance activity with periods of rest. Exercise and physical activity will help you maintain your ability to do things you want to do.  Preventing infection and hospitalization is very important when you have COPD. Make sure to receive all the vaccines your health care provider recommends, especially the pneumococcal and influenza vaccines. Ask your health care provider whether you need a pneumonia vaccine.  Learn and use relaxation techniques to manage stress.  Learn and use controlled breathing techniques as directed by your health care provider. Controlled breathing techniques include:  Pursed lip breathing. Start  by breathing in (inhaling) through your nose for 1 second. Then, purse your lips as if you were going to whistle and breathe out (exhale) through the pursed lips for 2 seconds.  Diaphragmatic breathing. Start by putting one hand on your abdomen just above your waist. Inhale slowly through your nose. The hand on your abdomen should move out. Then purse your lips and exhale slowly. You should be able to feel the hand on your abdomen moving in as you exhale.  Learn and use controlled coughing to clear mucus from your lungs. Controlled coughing is a series of short, progressive coughs. The steps of controlled coughing are: 1. Lean your head slightly forward. 2. Breathe in deeply using diaphragmatic breathing. 3. Try to hold your breath for 3 seconds. 4. Keep your mouth slightly open while coughing twice. 5. Spit any mucus out into a tissue. 6. Rest and repeat the steps once or twice as needed. SEEK MEDICAL CARE IF:  You are coughing up more mucus than usual.  There is a change in the color or thickness of your mucus.  Your breathing is more labored than usual.  Your breathing is faster than usual. SEEK IMMEDIATE MEDICAL CARE IF:  You have shortness of breath while you are resting.  You have shortness of breath that prevents you from:  Being able to talk.  Performing your usual physical activities.  You have chest pain lasting longer than 5 minutes.  Your skin color is more cyanotic than usual.  You measure low oxygen saturations for longer than 5 minutes with a pulse oximeter. MAKE SURE YOU:  Understand these instructions.  Will watch your condition.  Will get help right away if you are not doing well or get worse.   This information is not intended to replace advice given to you by your health care provider. Make sure you discuss any questions you have with your health care provider.   Document Released: 05/02/2005 Document Revised: 08/13/2014 Document Reviewed:  03/19/2013 Elsevier Interactive Patient Education [employer].

## 2016-05-03 NOTE — Progress Notes (Signed)
[facility] Consultation      Date: 05/03/2016,   MRN# [id number] Willie Crane [birth year] Code Status:  Code Status History    Date Active Date Inactive Code Status Order ID Comments User Context   01/26/2016  6:52 PM 01/27/2016 12:40 PM Full Code [id number]  Robert Bellow, MD Inpatient     Hosp day:@LENGTHOFSTAYDAYS @ Referring MD: @ATDPROV @     PCP:      AdmissionWeight: 234 lb (106.1 kg)                 CurrentWeight: 234 lb (106.1 kg) Willie Crane is a 70 y.o. old male seen in consultation for cough/congestion at the request of Dr. Sanda Klein.     CHIEF COMPLAINT:   cough   HISTORY OF PRESENT ILLNESS   70 yo white male with dx of ASTHMA in his 22's-he is [place] Vet Patient has been on inhaled steroids for many years. Patient has chronic fatigue and SOB and DOE for very long time Has been using albuterol more frequently in last several months Has occasional non-productive cough Patient las exacerbation was several years ago patient now on Symbicort and doing well on it-breathing seems to be in control  Quit smoking 40 years ago Smoked 1 ppd for 20 years Worked as Gaffer, retired 5 years now Has gained 40 pounds in 4 years  Patient  has been having sleep problems for over one year Patient has been having excessive daytime sleepiness Patient has been having extreme fatigue and tiredness, lack of energy Patient has been told that she has very  Loud snoring every night noticed by wife Patient has been told that she struggles to breathe at night and gasps for air   Office Arlyce Harman shows ratio 68% Fev1 1.8L 64% fef25/75 50% Moderate Obstructive and Restrictive lung disease   PAST MEDICAL HISTORY   Past Medical History:  Diagnosis Date  . Actinic keratosis   . Anxiety   . Asthma   . Benign paroxysmal positional vertigo   . BPH (benign prostatic hypertrophy)   . Complication of anesthesia   . COPD (chronic obstructive pulmonary  disease) ([place])   . Degeneration of lumbar or lumbosacral intervertebral disc   . History of kidney stones   . Hydrocele   . Hyperlipidemia   . Hypertension   . Inguinal hernia without mention of obstruction or gangrene, unilateral or unspecified, (not specified as recurrent)   . MI (myocardial infarction) ([place])    1980s  . Obesity, unspecified   . Pain in joint, lower leg   . PONV (postoperative nausea and vomiting)   . Pruritus ani   . PTSD (post-traumatic stress disorder)   . Testicular swelling   . Thoracic or lumbosacral neuritis or radiculitis, unspecified   . Unspecified congenital anomaly of the integument   . Unspecified gastritis and gastroduodenitis without mention of hemorrhage      SURGICAL HISTORY   Past Surgical History:  Procedure Laterality Date  . BACK SURGERY    . CARDIAC CATHETERIZATION  03/2009   [facility]: No significant coronary artery disease  . CARDIAC CATHETERIZATION  01/2014   [facility]  . CARPAL TUNNEL RELEASE Right   . CHOLECYSTECTOMY    . COLONOSCOPY    . COLONOSCOPY WITH PROPOFOL N/A 10/31/2015   Procedure: COLONOSCOPY WITH PROPOFOL;  Surgeon: Lucilla Lame, MD;  Location: [place];  Service: Endoscopy;  Laterality: N/A;  . CYST EXCISION    . HERNIA REPAIR    .  INGUINAL HERNIA REPAIR Right   . INGUINAL HERNIA REPAIR Left 01/26/2016   Procedure: HERNIA REPAIR INGUINAL ADULT;  Surgeon: Christene Lye, MD;  Location: [facility];  Service: General;  Laterality: Left;  . KNEE ARTHROSCOPY    . LUMBAR LAMINECTOMY    . POLYPECTOMY  10/31/2015   Procedure: POLYPECTOMY;  Surgeon: Lucilla Lame, MD;  Location: [place];  Service: Endoscopy;;  . VASECTOMY       FAMILY HISTORY   Family History  Problem Relation Age of Onset  . Kidney failure Mother   . Prostate cancer Neg Hx      SOCIAL HISTORY   Social History  Substance Use Topics  . Smoking status: Former Smoker    Packs/day: 1.00    Years: 20.00    Types: Cigarettes    Quit  date: 01/19/1979  . Smokeless tobacco: Former Systems developer    Types: Chew     Comment: quit smoking 30 years  . Alcohol use 0.0 oz/week     Comment: wine 1x/mo     MEDICATIONS    Home Medication:  Current Outpatient Rx  . Order #: [id number] Class: Normal  . Order #: [id number] Class: Normal  . Order #: [id number] Class: Fax  . Order #: [id number] Class: Normal  . Order #: [id number] Class: Historical Med  . Order #: [id number] Class: Normal  . Order #: [id number] Class: Historical Med    Current Medication:  Current Outpatient Prescriptions:  .  albuterol (PROVENTIL HFA) 108 (90 Base) MCG/ACT inhaler, Inhale 2 puffs into the lungs every 4 (four) hours as needed for wheezing or shortness of breath., Disp: 3 Inhaler, Rfl: 0 .  budesonide-formoterol (SYMBICORT) 160-4.5 MCG/ACT inhaler, Inhale 2 puffs into the lungs 2 (two) times daily., Disp: 30.6 g, Rfl: 0 .  finasteride (PROSCAR) 5 MG tablet, Take 1 tablet (5 mg total) by mouth daily., Disp: 90 tablet, Rfl: 3 .  FLUoxetine (PROZAC) 20 MG capsule, Take 1 capsule (20 mg total) by mouth daily., Disp: 90 capsule, Rfl: 0 .  Multiple Vitamin (MULTIVITAMIN) capsule, Take 1 capsule by mouth daily., Disp: , Rfl:  .  olmesartan (BENICAR) 20 MG tablet, Take 1 tablet (20 mg total) by mouth daily., Disp: 90 tablet, Rfl: 0 .  VITAMIN A PO, Take 1 tablet by mouth daily. , Disp: , Rfl:     ALLERGIES   Other     REVIEW OF SYSTEMS   Review of Systems  Constitutional: Negative for chills, diaphoresis, fever, malaise/fatigue and weight loss.  HENT: Positive for congestion. Negative for hearing loss.   Eyes: Negative for blurred vision and double vision.  Respiratory: Positive for cough, sputum production, shortness of breath and wheezing. Negative for hemoptysis.   Cardiovascular: Negative for chest pain, palpitations, orthopnea and leg swelling.  Gastrointestinal: Negative for abdominal pain, heartburn, nausea and vomiting.  Genitourinary: Negative for  dysuria and urgency.  Musculoskeletal: Negative for back pain, myalgias and neck pain.  Skin: Negative for rash.  Neurological: Negative for dizziness, weakness and headaches.  Endo/Heme/Allergies: Does not bruise/bleed easily.  Psychiatric/Behavioral: Negative for depression.  All other systems reviewed and are negative.    VS: BP (!) 148/96 (BP Location: Left Arm, Cuff Size: Normal)   Pulse 64   Ht 5\' 7"  (1.702 m)   Wt 234 lb (106.1 kg)   SpO2 97%   BMI 36.65 kg/m      PHYSICAL EXAM  Physical Exam  Constitutional: He is oriented to person, place, and time. He appears well-developed and well-nourished. No distress.  HENT:  Head: Normocephalic and atraumatic.  Mouth/Throat: No oropharyngeal exudate.  Eyes: EOM are normal. Pupils are equal, round, and reactive to light. No scleral icterus.  Neck: Normal range of motion. Neck supple.  Cardiovascular: Normal rate, regular rhythm and normal heart sounds.   No murmur heard. Pulmonary/Chest: No stridor. No respiratory distress. He has no wheezes.  Abdominal: Soft. Bowel sounds are normal.  Musculoskeletal: Normal range of motion. He exhibits no edema.  Neurological: He is alert and oriented to person, place, and time. No cranial nerve deficit.  Skin: Skin is warm. He is not diaphoretic.  Psychiatric: He has a normal mood and affect.       ASSESSMENT/PLAN   70 yo pleasant overweight white male with Moderate COPD Gold Stage B with signs and symptoms of sleep apnea  1.recommend starting long acting AC-will give Incruse 2.continue inhaled steroids/LABA-will start BREO 200 3.albuterol as needed 4..check ONO and Ambulating Pulse Ox 5.will need sleep study to assess for OSA  Follow up after tests completed  I have personally obtained a history, examined the patient, evaluated laboratory and independently reviewed imaging results, formulated the assessment and plan and placed orders.  The Patient requires high complexity  decision making for assessment and support, frequent evaluation and titration of therapies, application of advanced monitoring technologies and extensive interpretation of multiple databases.   Patient/Family are satisfied with Plan of action and management. All questions answered  Corrin Parker, M.D.  [facility]  Medical Director [place] Director [facility] Cardio-Pulmonary Department

## 2016-05-03 NOTE — Addendum Note (Signed)
Addended by: Oscar La R on: 05/03/2016 11:06 AM   Modules accepted: Orders

## 2016-05-07 ENCOUNTER — Encounter: Payer: Self-pay | Admitting: Internal Medicine

## 2016-05-07 DIAGNOSIS — J449 Chronic obstructive pulmonary disease, unspecified: Secondary | ICD-10-CM

## 2016-05-14 ENCOUNTER — Telehealth: Payer: Self-pay | Admitting: Internal Medicine

## 2016-05-14 MED ORDER — UMECLIDINIUM BROMIDE 62.5 MCG/INH IN AEPB: 1.0000 | INHALATION_SPRAY | Freq: Every day | RESPIRATORY_TRACT | 5 refills | Status: DC

## 2016-05-14 MED ORDER — FLUTICASONE FUROATE-VILANTEROL 200-25 MCG/INH IN AEPB: 1.0000 | INHALATION_SPRAY | Freq: Every day | RESPIRATORY_TRACT | 5 refills | Status: DC

## 2016-05-14 NOTE — Telephone Encounter (Signed)
Patient needs the paperwork for the [facility] inhaler done. The Breo worked for the patient. Also the Incruse. Please call patient.

## 2016-05-14 NOTE — Telephone Encounter (Signed)
Pt needs BREO and INCRUSE called into [facility] on [address] Please call patient once this has been done

## 2016-05-14 NOTE — Telephone Encounter (Signed)
RXs sent into [facility]. Pt informed. Nothing further needed.

## 2016-05-14 NOTE — Telephone Encounter (Signed)
Spoke with pt and went over how to use the coupon for the Breo and Incruse. Answered all questions. Nothing further needed.

## 2016-05-15 ENCOUNTER — Telehealth: Payer: Self-pay | Admitting: *Deleted

## 2016-05-15 NOTE — Telephone Encounter (Signed)
Called pt and gave him his ONO results and informed him of the need for 2L O2 QHS and he stated that it was not going to happen and that he wouldn't do well with the O2. Informed pt of numbers and he says not to place the order.   FYI

## 2016-05-18 ENCOUNTER — Emergency Department: Payer: Federal, State, Local not specified - PPO

## 2016-05-18 ENCOUNTER — Emergency Department
Admission: EM | Admit: 2016-05-18 | Discharge: 2016-05-18 | Disposition: A | Discharge status: Home / Self Care | Payer: Federal, State, Local not specified - PPO | Attending: Emergency Medicine | Admitting: Emergency Medicine

## 2016-05-18 ENCOUNTER — Encounter: Payer: Self-pay | Admitting: Medical Oncology

## 2016-05-18 DIAGNOSIS — R1013 Epigastric pain: Secondary | ICD-10-CM

## 2016-05-18 DIAGNOSIS — Z79899 Other long term (current) drug therapy: Secondary | ICD-10-CM | POA: Diagnosis not present

## 2016-05-18 DIAGNOSIS — I10 Essential (primary) hypertension: Secondary | ICD-10-CM | POA: Insufficient documentation

## 2016-05-18 DIAGNOSIS — J45909 Unspecified asthma, uncomplicated: Secondary | ICD-10-CM | POA: Insufficient documentation

## 2016-05-18 DIAGNOSIS — J449 Chronic obstructive pulmonary disease, unspecified: Secondary | ICD-10-CM | POA: Insufficient documentation

## 2016-05-18 DIAGNOSIS — Z87891 Personal history of nicotine dependence: Secondary | ICD-10-CM | POA: Insufficient documentation

## 2016-05-18 DIAGNOSIS — I252 Old myocardial infarction: Secondary | ICD-10-CM | POA: Diagnosis not present

## 2016-05-18 LAB — BASIC METABOLIC PANEL
ANION GAP: 7 (ref 5–15)
BUN: 20 mg/dL (ref 6–20)
CHLORIDE: 108 mmol/L (ref 101–111)
CO2: 23 mmol/L (ref 22–32)
Calcium: 8.7 mg/dL — ABNORMAL LOW (ref 8.9–10.3)
Creatinine, Ser: 1.15 mg/dL (ref 0.61–1.24)
Glucose, Bld: 121 mg/dL — ABNORMAL HIGH (ref 65–99)
POTASSIUM: 3.7 mmol/L (ref 3.5–5.1)
SODIUM: 138 mmol/L (ref 135–145)

## 2016-05-18 LAB — CBC
HEMATOCRIT: 45.3 % (ref 40.0–52.0)
Hemoglobin: 15.8 g/dL (ref 13.0–18.0)
MCH: 30.7 pg (ref 26.0–34.0)
MCHC: 34.9 g/dL (ref 32.0–36.0)
MCV: 88.1 fL (ref 80.0–100.0)
Platelets: 186 10*3/uL (ref 150–440)
RBC: 5.15 MIL/uL (ref 4.40–5.90)
RDW: 13.3 % (ref 11.5–14.5)
WBC: 6.3 10*3/uL (ref 3.8–10.6)

## 2016-05-18 LAB — TROPONIN I: Troponin I: <0.03 ng/mL (ref <0.03)

## 2016-05-18 MED ORDER — ASPIRIN EC 81 MG PO TBEC
81.0000 mg | DELAYED_RELEASE_TABLET | Freq: Once | ORAL | Status: AC
  Administered 2016-05-18: 81 mg via ORAL
  Filled 2016-05-18: qty 1

## 2016-05-18 MED ORDER — GI COCKTAIL ~~LOC~~
30.0000 mL | Freq: Once | ORAL | Status: AC
  Administered 2016-05-18: 30 mL via ORAL

## 2016-05-18 MED ORDER — GI COCKTAIL ~~LOC~~
ORAL | Status: AC
  Administered 2016-05-18: 30 mL via ORAL
  Filled 2016-05-18: qty 30

## 2016-05-18 MED ORDER — FAMOTIDINE 20 MG PO TABS
20.0000 mg | ORAL_TABLET | Freq: Once | ORAL | Status: AC
  Administered 2016-05-18: 20 mg via ORAL

## 2016-05-18 MED ORDER — FAMOTIDINE 20 MG PO TABS
ORAL_TABLET | ORAL | Status: AC
  Administered 2016-05-18: 20 mg via ORAL
  Filled 2016-05-18: qty 1

## 2016-05-18 MED ORDER — FAMOTIDINE 20 MG PO TABS: 20.0000 mg | ORAL_TABLET | Freq: Two times a day (BID) | ORAL | 1 refills | Status: DC

## 2016-05-18 MED ORDER — PANTOPRAZOLE SODIUM 40 MG PO TBEC: 40.0000 mg | DELAYED_RELEASE_TABLET | Freq: Every day | ORAL | 1 refills | Status: DC

## 2016-05-18 NOTE — ED Notes (Signed)
Discharge instructions reviewed with patient. Patient verbalized understanding. Patient ambulated to lobby without difficulty.   

## 2016-05-18 NOTE — ED Triage Notes (Signed)
Pt reports that he has been having epigastric pain for about 2 weeks, the pain radiates up into chest and into his jaw. Pt reports that pain is an aching pain. Denies sob. Denies n/v.

## 2016-05-18 NOTE — ED Provider Notes (Signed)
[facility] Emergency Department Provider Note        Time seen: ----------------------------------------- 5:00 PM on 05/18/2016 -----------------------------------------    I have reviewed the triage vital signs and the nursing notes.   HISTORY  Chief Complaint Abdominal Pain and Chest Pain    HPI Willie Crane is a 70 y.o. male who presents to ER reporting epigastric pain for about 2 weeks. Patient states the pain radiates into his chest and into his jaw. He reports the pain is aching,sometimes he has pain around left ribs that is sharp. Nothing seems to make his symptoms better or worse, his wife just found out about his symptoms 4 days ago. Patient states he had a heart catheter 4 years ago which was unremarkable. He does not take aspirin, he denies other complaints at this time.   Past Medical History:  Diagnosis Date  . Actinic keratosis   . Anxiety   . Asthma   . Benign paroxysmal positional vertigo   . BPH (benign prostatic hypertrophy)   . Complication of anesthesia   . COPD (chronic obstructive pulmonary disease) ([place])   . Degeneration of lumbar or lumbosacral intervertebral disc   . History of kidney stones   . Hydrocele   . Hyperlipidemia   . Hypertension   . Inguinal hernia without mention of obstruction or gangrene, unilateral or unspecified, (not specified as recurrent)   . MI (myocardial infarction)    1980s  . Obesity, unspecified   . Pain in joint, lower leg   . PONV (postoperative nausea and vomiting)   . Pruritus ani   . PTSD (post-traumatic stress disorder)   . Testicular swelling   . Thoracic or lumbosacral neuritis or radiculitis, unspecified   . Unspecified congenital anomaly of the integument   . Unspecified gastritis and gastroduodenitis without mention of hemorrhage     Patient Active Problem List   Diagnosis Date Noted  . Nausea after anesthesia 01/26/2016  . Obesity 12/27/2015  . Medication monitoring  encounter 12/27/2015  . Chronic left lower quadrant pain 12/27/2015  . Inguinal hernia 12/26/2015  . Special screening for malignant neoplasms, colon   . Benign neoplasm of transverse colon   . Encounter for screening for malignant neoplasm of colon 10/21/2015  . Moderate persistent asthma 09/28/2015  . 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 Hydrocele, bilateral 07/12/2015  . Hyperlipidemia   . Hypertension     Past Surgical History:  Procedure Laterality Date  . BACK SURGERY    . CARDIAC CATHETERIZATION  03/2009   [facility]: No significant coronary artery disease  . CARD[facility]AC [facility]IZATION  [facility]/2014   [facility]  . CARPAL TUNNEL RELEASE Right   . CHOLECYSTECTOMY    . COLONOSCOPY    . COLONOSCOPY WITH PROPOFOL N/A 10/31/2015   Procedure: COLONOSCOPY WITH PROPOFOL;  Surgeon: Lucilla Lame, MD;  Location: [place];  Service: Endoscopy;  Laterality: N/A;  . CYST EXCISION    . HERNIA REPAIR    . INGUINAL HERNIA REPAIR Right   . INGUINAL HERNIA REPAIR Left 01/26/2016   Procedure: HERNIA REPAIR INGUINAL ADULT;  Surgeon: Christene Lye, MD;  Location: [facility];  Service: General;  Laterality: Left;  . KNEE ARTHROSCOPY    . LUMBAR LAMINECTOMY    . POLYPECTOMY  10/31/2015   Procedure: POLYPECTOMY;  Surgeon: Lucilla Lame, MD;  Location: [place];  Service: Endoscopy;;  . VASECTOMY      Allergies Other  Social History Social History  Substance  Use Topics  . Smoking status: Former Smoker    Packs/day: 1.00    Years: 20.00    Types: Cigarettes    Quit date: 01/19/1979  . Smokeless tobacco: Former Systems developer    Types: Chew     Comment: quit smoking 30 years  . Alcohol use 0.0 oz/week     Comment: wine 1x/mo    Review of Systems Constitutional: Negative for fever. Cardiovascular: Negative for chest pain. Respiratory: Negative for shortness of breath. Gastrointestinal: Positive for abdominal pain Genitourinary: Negative for  dysuria. Musculoskeletal: Negative for back pain. Skin: Negative for rash. Neurological: Negative for headaches, focal weakness or numbness.  10-point ROS otherwise negative.  ____________________________________________   PHYSICAL EXAM:  VITAL SIGNS: ED Triage Vitals [05/18/16 1555]  Enc Vitals Group     BP 140/86     Pulse Rate 86     Resp 20     Temp 97.1 F (36.2 C)     Temp Source Oral     SpO2 97 %     Weight 230 lb (104.3 kg)     Height 5\' 7"  (1.702 m)     Head Circumference      Peak Flow      Pain Score 1     Pain Loc      Pain Edu?      Excl. in [place]?     Constitutional: Alert and oriented. Well appearing and in no distress. Eyes: Conjunctivae are normal. PERRL. Normal extraocular movements. ENT   Head: Normocephalic and atraumatic.   Nose: No congestion/rhinnorhea.   Mouth/Throat: Mucous membranes are moist.   Neck: No stridor. Cardiovascular: Normal rate, regular rhythm. No murmurs, rubs, or gallops. Respiratory: Normal respiratory effort without tachypnea nor retractions. Breath sounds are clear and equal bilaterally. No wheezes/rales/rhonchi. Gastrointestinal: Soft and nontender. Normal bowel sounds Musculoskeletal: Nontender with normal range of motion in all extremities. No lower extremity tenderness nor edema. Neurologic:  Normal speech and language. No gross focal neurologic deficits are appreciated.  Skin:  Skin is warm, dry and intact. No rash noted. Psychiatric: Mood and affect are normal. Speech and behavior are normal.  ____________________________________________  EKG: Interpreted by me. Sinus rhythm rate of 96 bpm, normal PR interval, normal QRS, normal QT interval. Left axis deviation. Possible LVH.  ____________________________________________  ED COURSE:  Pertinent labs & imaging results that were available during my care of the patient were reviewed by me and considered in my medical decision making (see chart for  details). Clinical Course  Patient presents to ER stress. We will check basic labs and imaging.  Procedures ____________________________________________   LABS (pertinent positives/negatives)  Labs Reviewed  BASIC METABOLIC PANEL - Abnormal; Notable for the following:       Result Value   Glucose, Bld 121 (*)    Calcium 8.7 (*)    All other components within normal limits  CBC  TROPONIN I  TROPONIN I    RADIOLOGY Chest x-ray  IMPRESSION: Stable. Hyperexpansion without acute cardiopulmonary abnormality.  ____________________________________________  FINAL ASSESSMENT AND PLAN  Epigastric pain  Plan: Patient with labs and imaging as dictated above. Patient was serial troponins that are negative here. Symptoms seem GERD related. His cardiac catheter 2 years ago was clear. I will start him on ant-acids and for him to outpatient follow-up. He will follow up first with cardiology and then with gastroenterology.   Earleen Newport, MD   Note: This dictation was prepared with Dragon dictation. Any transcriptional errors that result  from this process are unintentional    Earleen Newport, MD 05/18/16 2136

## 2016-06-15 ENCOUNTER — Telehealth: Payer: Self-pay | Admitting: Internal Medicine

## 2016-06-15 NOTE — Telephone Encounter (Signed)
°*  STAT* If patient is at the pharmacy, call can be transferred to refill team.   1. Which medications need to be refilled? (please list name of each medication and dose if known)   breo and incruse    2. Which pharmacy/location (including street and city if local pharmacy) is medication to be sent to? [facility] [address] [place]   3. Do they need a 30 day or 90 day supply?  1 year through assistance program

## 2016-06-15 NOTE — Telephone Encounter (Signed)
Pt states his insurance won't allow a tier exception for Breo & Incruse. Pt states he used Symbicort but he also had to use the Proventil multiple times daily. He doesn't qualify for pt assistance from [place]. Would you like to try something like Bevespi or any other?

## 2016-06-15 NOTE — Telephone Encounter (Signed)
Pt states he found out that he is not eligible for these inhalers, due to he is over 65. Please call.

## 2016-06-15 NOTE — Telephone Encounter (Signed)
Pt states Breo and Incruse is helping his breathing but are expensive. Informed pt to call his RX coverage and ask if they would allow a tier exception for these medications. Pt states once he speaks with them he will call me back.

## 2016-06-18 NOTE — Telephone Encounter (Signed)
Informed pt to try Bevespi per DK. Will leave samples up front for pt to pick up. Nothing further needed.

## 2016-06-18 NOTE — Telephone Encounter (Signed)
Try bevespi 

## 2016-06-30 ENCOUNTER — Other Ambulatory Visit: Payer: Self-pay | Admitting: Family Medicine

## 2016-07-02 ENCOUNTER — Other Ambulatory Visit: Payer: Self-pay

## 2016-07-02 MED ORDER — OLMESARTAN MEDOXOMIL 20 MG PO TABS: 20.0000 mg | ORAL_TABLET | Freq: Every day | ORAL | 1 refills | Status: DC

## 2016-07-02 NOTE — Telephone Encounter (Signed)
Reviewed Cr and k+ from Oct; Rx approved

## 2016-07-03 ENCOUNTER — Other Ambulatory Visit: Payer: Self-pay

## 2016-07-03 DIAGNOSIS — N401 Enlarged prostate with lower urinary tract symptoms: Secondary | ICD-10-CM

## 2016-07-06 ENCOUNTER — Other Ambulatory Visit: Payer: Federal, State, Local not specified - PPO

## 2016-07-06 DIAGNOSIS — N401 Enlarged prostate with lower urinary tract symptoms: Secondary | ICD-10-CM

## 2016-07-07 LAB — PSA: Prostate Specific Ag, Serum: 1.3 ng/mL (ref 0.0–4.0)

## 2016-07-10 ENCOUNTER — Ambulatory Visit: Payer: Federal, State, Local not specified - PPO | Admitting: Urology

## 2016-07-10 ENCOUNTER — Encounter: Payer: Self-pay | Admitting: Urology

## 2016-07-10 VITALS — BP 141/85 | HR 85 | Ht 67.0 in | Wt 231.3 lb

## 2016-07-10 DIAGNOSIS — N401 Enlarged prostate with lower urinary tract symptoms: Secondary | ICD-10-CM

## 2016-07-10 DIAGNOSIS — N138 Other obstructive and reflux uropathy: Secondary | ICD-10-CM | POA: Diagnosis not present

## 2016-07-10 DIAGNOSIS — N433 Hydrocele, unspecified: Secondary | ICD-10-CM | POA: Diagnosis not present

## 2016-07-10 MED ORDER — FINASTERIDE 5 MG PO TABS: 5.0000 mg | ORAL_TABLET | Freq: Every day | ORAL | 3 refills | Status: DC

## 2016-07-10 NOTE — Progress Notes (Signed)
07/10/2016 3:20 PM   Willie Crane [birth year] [id number]  Referring provider: Bobetta Lime, Crane [address] [place], [place] [address]  Chief Complaint  Patient presents with  . Benign Prostatic Hypertrophy    HPI: Patient is a 70 year old  Caucasian  male with BPH with LUTS and bilateral hydroceles who presents today for a one year follow up.    BPH WITH LUTS His IPSS score today is 12, which is moderate lower urinary tract symptomatology. He is pleased with his quality life due to his urinary symptoms.  His previous IPSS score is 7/0.  He denies any dysuria, hematuria or suprapubic pain.   He currently taking finasteride 5 mg daily.  He also denies any recent fevers, chills, nausea or vomiting.   He does not have a family history of PCa.      IPSS    Row Name 07/10/16 1500         International Prostate Symptom Score   How often have you had the sensation of not emptying your bladder? Less than half the time     How often have you had to urinate less than every two hours? Less than half the time     How often have you found you stopped and started again several times when you urinated? Less than half the time     How often have you found it difficult to postpone urination? Less than half the time     How often have you had a weak urinary stream? Less than half the time     How often have you had to strain to start urination? Less than 1 in 5 times     How many times did you typically get up at night to urinate? 1 Time     Total IPSS Score 12       Quality of Life due to urinary symptoms   If you were to spend the rest of your life with your urinary condition just the way it is now how would you feel about that? Pleased        Score:  1-7 Mild 8-19 Moderate 20-35 Severe  Hydroceles Patient underwent bilateral hernia repair and hydroceles have resolved.     PMH: Past Medical History:  Diagnosis Date  . Actinic keratosis   . Anxiety   .  Asthma   . Benign paroxysmal positional vertigo   . BPH (benign prostatic hypertrophy)   . Complication of anesthesia   . COPD (chronic obstructive pulmonary disease) ([place])   . Degeneration of lumbar or lumbosacral intervertebral disc   . History of kidney stones   . Hydrocele   . Hyperlipidemia   . Hypertension   . Inguinal hernia without mention of obstruction or gangrene, unilateral or unspecified, (not specified as recurrent)   . MI (myocardial infarction)    1980s  . Obesity, unspecified   . Pain in joint, lower leg   . PONV (postoperative nausea and vomiting)   . Pruritus ani   . PTSD (post-traumatic stress disorder)   . Testicular swelling   . Thoracic or lumbosacral neuritis or radiculitis, unspecified   . Unspecified congenital anomaly of the integument   . Unspecified gastritis and gastroduodenitis without mention of hemorrhage     Surgical History: Past Surgical History:  Procedure Laterality Date  . BACK SURGERY    . CARDIAC CATHETERIZATION  03/2009   [facility]: No significant coronary artery disease  . CARDIAC CATHETERIZATION  01/2014   [facility]  . CARPAL TUNNEL RELEASE Right   . CHOLECYSTECTOMY    . COLONOSCOPY    . COLONOSCOPY WITH PROPOFOL N/A 10/31/2015   Procedure: COLONOSCOPY WITH PROPOFOL;  Surgeon: Willie Crane;  Location: [place];  Service: Endoscopy;  Laterality: N/A;  . CYST EXCISION    . HERNIA REPAIR    . INGUINAL HERNIA REPAIR Right   . INGUINAL HERNIA REPAIR Left 01/26/2016   Procedure: HERNIA REPAIR INGUINAL ADULT;  Surgeon: Willie Crane;  Location: [facility];  Service: General;  Laterality: Left;  . KNEE ARTHROSCOPY    . LUMBAR LAMINECTOMY    . POLYPECTOMY  10/31/2015   Procedure: POLYPECTOMY;  Surgeon: Willie Crane;  Location: [place];  Service: Endoscopy;;  . VASECTOMY      Home Medications:    Medication List       Accurate as of 07/10/16  3:20 PM. Always use your most recent med list.            albuterol 108 (90 Base) MCG/ACT inhaler Commonly known as:  PROVENTIL HFA Inhale 2 puffs into the lungs every 4 (four) hours as needed for wheezing or shortness of breath.   budesonide-formoterol 160-4.5 MCG/ACT inhaler Commonly known as:  SYMBICORT Inhale 2 puffs into the lungs 2 (two) times daily.   famotidine 20 MG tablet Commonly known as:  PEPCID Take 1 tablet (20 mg total) by mouth 2 (two) times daily.   finasteride 5 MG tablet Commonly known as:  PROSCAR Take 1 tablet (5 mg total) by mouth daily.   FLUoxetine 20 MG capsule Commonly known as:  PROZAC TAKE 1 CAPSULE (20 MG TOTAL) BY MOUTH DAILY.   fluticasone furoate-vilanterol 200-25 MCG/INH Aepb Commonly known as:  BREO ELLIPTA Inhale 1 puff into the lungs daily.   multivitamin capsule Take 1 capsule by mouth daily.   olmesartan 20 MG tablet Commonly known as:  BENICAR Take 1 tablet (20 mg total) by mouth daily.   pantoprazole 40 MG tablet Commonly known as:  PROTONIX Take 1 tablet (40 mg total) by mouth daily.   umeclidinium bromide 62.5 MCG/INH Aepb Commonly known as:  INCRUSE ELLIPTA Inhale 1 puff into the lungs daily.   VITAMIN A PO Take 1 tablet by mouth daily.       Allergies:  Allergies  Allergen Reactions  . Other Swelling    Lips swell - Potatoes, Kiwi, carrots, celery (raw)      Family History: Family History  Problem Relation Age of Onset  . Kidney failure Mother   . Prostate cancer Neg Hx     Social History:  reports that he quit smoking about 37 years ago. His smoking use included Cigarettes. He has a 20.00 pack-year smoking history. He has quit using smokeless tobacco. His smokeless tobacco use included Chew. He reports that he drinks alcohol. He reports that he does not use drugs.  ROS: UROLOGY Frequent Urination?: No Hard to postpone urination?: No Burning/pain with urination?: No Get up at night to urinate?: No Leakage of urine?: No Urine stream starts and stops?:  No Trouble starting stream?: No Do you have to strain to urinate?: No Blood in urine?: No Urinary tract infection?: No Sexually transmitted disease?: No Injury to kidneys or bladder?: No Painful intercourse?: No Weak stream?: No Erection problems?: No Penile pain?: No  Gastrointestinal Nausea?: No Vomiting?: No Indigestion/heartburn?: No Diarrhea?: No Constipation?: No  Constitutional Fever: No Night sweats?: No Weight loss?: No Fatigue?:  No  Skin Skin rash/lesions?: No Itching?: No  Eyes Blurred vision?: No Double vision?: No  Ears/Nose/Throat Sore throat?: No Sinus problems?: No  Hematologic/Lymphatic Swollen glands?: No Easy bruising?: No  Cardiovascular Leg swelling?: No Chest pain?: No  Respiratory Cough?: No Shortness of breath?: No  Endocrine Excessive thirst?: No  Musculoskeletal Back pain?: No Joint pain?: Yes  Neurological Headaches?: No Dizziness?: No  Psychologic Depression?: No Anxiety?: No  Physical Exam: BP (!) 141/85 (BP Location: Left Arm, Patient Position: Sitting, Cuff Size: Large)   Pulse 85   Ht 5\' 7"  (1.702 m)   Wt 231 lb 4.8 oz (104.9 kg)   BMI 36.23 kg/m   GU: No CVA tenderness.  No bladder fullness or masses.  Patient with circumcised phallus.   Urethral meatus is patent.  No penile discharge. No penile lesions or rashes. Scrotum without lesions, cysts, rashes and/or edema.  Hydroceles bilaterally. No masses are appreciated in the testicles. Left and right epididymis are normal. Rectal: Patient with  normal sphincter tone. Anus and perineum without scarring or rashes. No rectal masses are appreciated. Prostate is approximately 50 grams (could not palpated entire gland due to buttocks tissue), no nodules are appreciated. Seminal vesicles not palpable.   Laboratory Data: Lab Results  Component Value Date   WBC 6.3 05/18/2016   HGB 15.8 05/18/2016   HCT 45.3 05/18/2016   MCV 88.1 05/18/2016   PLT 186 05/18/2016     Lab Results  Component Value Date   CREATININE 1.15 05/18/2016  PSA History  1.9 ng/mL on 04/29/2014  1.6 ng/mL on 12/30/2014  1.4 ng/mL one year ago  1.3 ng/mL on 07/06/2016   Assessment & Plan:    1. BPH with LUTS  - IPSS score is 12/1, it is stable/improving/worsening  - Continue conservative management, avoiding bladder irritants and timed voiding's  - Continue finasteride 5 mg daily; refills given  - RTC in 12 months for IPSS, PSA and exam   2. Hydroceles  - s/p bilateral hernia repair  - hydroceles are resolved   Return in about 1 year (around 07/10/2017) for IPSS, PSA and exam.  Zara Council, [facility]  [facility] [address], [place], [place] [address] [phone]

## 2016-08-09 ENCOUNTER — Ambulatory Visit (INDEPENDENT_AMBULATORY_CARE_PROVIDER_SITE_OTHER): Payer: Federal, State, Local not specified - PPO | Admitting: Internal Medicine

## 2016-08-09 ENCOUNTER — Encounter: Payer: Self-pay | Admitting: Internal Medicine

## 2016-08-09 VITALS — BP 140/78 | HR 87 | Ht 67.0 in | Wt 238.4 lb

## 2016-08-09 DIAGNOSIS — J449 Chronic obstructive pulmonary disease, unspecified: Secondary | ICD-10-CM

## 2016-08-09 MED ORDER — BUDESONIDE-FORMOTEROL FUMARATE 160-4.5 MCG/ACT IN AERO: 2.0000 | INHALATION_SPRAY | Freq: Two times a day (BID) | RESPIRATORY_TRACT | 0 refills | Status: DC

## 2016-08-09 NOTE — Progress Notes (Signed)
[facility] Consultation      Date: 08/09/2016,   MRN# [id number] ORAS SPLITT 28-Jan-1946 Code Status:  Code Status History    Date Active Date Inactive Code Status Order ID Comments User Context   01/26/2016  6:52 PM 01/27/2016 12:40 PM Full Code [id number]  Robert Bellow, MD Inpatient     Hosp day:@LENGTHOFSTAYDAYS @ Referring MD: @ATDPROV @     PCP:      AdmissionWeight: 238 lb 6.4 oz (108.1 kg)                 CurrentWeight: 238 lb 6.4 oz (108.1 kg) Willie Crane is a 71 y.o. old male seen in consultation for cough/congestion at the request of Dr. Sanda Klein.     CHIEF COMPLAINT:   Cough, follow up moderate COPD Follow up Moderate COPD  HISTORY OF PRESENT ILLNESS   71 yo white male with dx of ASTHMA in his 47's-he is [place] V[place] Patient has been on inhaled steroids for many years. Patient has chronic fatigue and SOB and DOE for very long time Has been using albuterol more frequently in last several months Has occasional non-productive cough Patient las exacerbation was several years ago patient now on Symbicort and doing well on it-breathing seems to be in control  Quit smoking 40 years ago Smoked 1 ppd for 20 years Worked as Gaffer, retired 5 years now Has gained 40 pounds in 4 years  Patient refuses sleep study and also refuses to use oxygen if prescribed   Office Arlyce Harman shows ratio 68% Fev1 1.8L 64% fef25/75 50% Moderate Obstructive and Restrictive lung disease    Current Medication:  Current Outpatient Prescriptions:  .  albuterol (PROVENTIL HFA) 108 (90 Base) MCG/ACT inhaler, Inhale 2 puffs into the lungs every 4 (four) hours as needed for wheezing or shortness of breath., Disp: 3 Inhaler, Rfl: 0 .  budesonide-formoterol (SYMBICORT) 160-4.5 MCG/ACT inhaler, Inhale 2 puffs into the lungs 2 (two) times daily., Disp: 30.6 g, Rfl: 0 .  famotidine (PEPCID) 20 MG tablet, Take 1 tablet (20 mg total) by mouth 2 (two) times daily.,  Disp: 60 tablet, Rfl: 1 .  finasteride (PROSCAR) 5 MG tablet, Take 1 tablet (5 mg total) by mouth daily., Disp: 90 tablet, Rfl: 3 .  FLUoxetine (PROZAC) 20 MG capsule, TAKE 1 CAPSULE (20 MG TOTAL) BY MOUTH DAILY., Disp: 90 capsule, Rfl: 1 .  Glycopyrrolate-Formoterol (BEVESPI AEROSPHERE) 9-4.8 MCG/ACT AERO, Inhale into the lungs., Disp: , Rfl:  .  Multiple Vitamin (MULTIVITAMIN) capsule, Take 1 capsule by mouth daily., Disp: , Rfl:  .  olmesartan (BENICAR) 20 MG tablet, Take 1 tablet (20 mg total) by mouth daily., Disp: 90 tablet, Rfl: 1 .  pantoprazole (PROTONIX) 40 MG tablet, Take 1 tablet (40 mg total) by mouth daily., Disp: 30 tablet, Rfl: 1 .  VITAMIN A PO, Take 1 tablet by mouth daily. , Disp: , Rfl:     ALLERGIES   Other     REVIEW OF SYSTEMS   Review of Systems  Constitutional: Negative for chills, diaphoresis, fever, malaise/fatigue and weight loss.  HENT: Negative for congestion and hearing loss.   Eyes: Negative for blurred vision and double vision.  Respiratory: Negative for cough, hemoptysis, sputum production, shortness of breath and wheezing.   Cardiovascular: Negative for chest pain, palpitations, orthopnea and leg swelling.  Gastrointestinal: Negative for abdominal pain, heartburn, nausea and vomiting.  Skin: Negative for rash.  Neurological: Negative for weakness and headaches.  All other systems  reviewed and are negative.    VS: BP 140/78 (BP Location: Left Arm, Cuff Size: Normal)   Pulse 87   Ht 5\' 7"  (1.702 m)   Wt 238 lb 6.4 oz (108.1 kg)   SpO2 94%   BMI 37.34 kg/m      PHYSICAL EXAM  Physical Exam  Constitutional: He is oriented to person, place, and time. He appears well-developed and well-nourished. No distress.  HENT:  Mouth/Throat: No oropharyngeal exudate.  Neck: Neck supple.  Cardiovascular: Normal rate, regular rhythm and normal heart sounds.   No murmur heard. Pulmonary/Chest: Effort normal and breath sounds normal. No stridor. No  respiratory distress. He has no wheezes.  Musculoskeletal: Normal range of motion. He exhibits no edema.  Neurological: He is alert and oriented to person, place, and time.  Skin: Skin is warm. He is not diaphoretic.  Psychiatric: He has a normal mood and affect.       ASSESSMENT/PLAN   71 yo pleasant overweight white male with Moderate COPD Gold Stage B with signs and symptoms of sleep apnea Patient refuses sleep study, and also refuses to use oxygen if prescribed  1.contineu SYmbicort as prescribed 2.albuterol as needed   Follow up in 3 months  I have personally obtained a history, examined the patient, evaluated laboratory and independently reviewed imaging results, formulated the assessment and plan and placed orders.  The Patient requires high complexity decision making for assessment and support, frequent evaluation and titration of therapies, application of advanced monitoring technologies and extensive interpretation of multiple databases.   Patient satisfied with Plan of action and management. All questions answered  Corrin Parker, M.D.  [facility]  Medical Director [place] Director [facility] Cardio-Pulmonary Department

## 2016-08-09 NOTE — Patient Instructions (Signed)
Continue Symbicort and albuterol as needed  Chronic Obstructive Pulmonary Disease Chronic obstructive pulmonary disease (COPD) is a common lung problem. In COPD, the flow of air from the lungs is limited. The way your lungs work will probably never return to normal, but there are things you can do to improve your lungs and make yourself feel better. Your doctor may treat your condition with:  Medicines.  Oxygen.  Lung surgery.  Changes to your diet.  Rehabilitation. This may involve a team of specialists. Follow these instructions at home:  Take all medicines as told by your doctor.  Avoid medicines or cough syrups that dry up your airway (such as antihistamines) and do not allow you to get rid of thick spit. You do not need to avoid them if told differently by your doctor.  If you smoke, stop. Smoking makes the problem worse.  Avoid being around things that make your breathing worse (like smoke, chemicals, and fumes).  Use oxygen therapy and therapy to help improve your lungs (pulmonary rehabilitation) if told by your doctor. If you need home oxygen therapy, ask your doctor if you should buy a tool to measure your oxygen level (oximeter).  Avoid people who have a sickness you can catch (contagious).  Avoid going outside when it is very hot, cold, or humid.  Eat healthy foods. Eat smaller meals more often. Rest before meals.  Stay active, but remember to also rest.  Make sure to get all the shots (vaccines) your doctor recommends. Ask your doctor if you need a pneumonia shot.  Learn and use tips on how to relax.  Learn and use tips on how to control your breathing as told by your doctor. Try: 1. Breathing in (inhaling) through your nose for 1 second. Then, pucker your lips and breath out (exhale) through your lips for 2 seconds. 2. Putting one hand on your belly (abdomen). Breathe in slowly through your nose for 1 second. Your hand on your belly should move out. Pucker your  lips and breathe out slowly through your lips. Your hand on your belly should move in as you breathe out.  Learn and use controlled coughing to clear thick spit from your lungs. The steps are: 1. Lean your head a little forward. 2. Breathe in deeply. 3. Try to hold your breath for 3 seconds. 4. Keep your mouth slightly open while coughing 2 times. 5. Spit any thick spit out into a tissue. 6. Rest and do the steps again 1 or 2 times as needed. Contact a doctor if:  You cough up more thick spit than usual.  There is a change in the color or thickness of the spit.  It is harder to breathe than usual.  Your breathing is faster than usual. Get help right away if:  You have shortness of breath while resting.  You have shortness of breath that stops you from:  Being able to talk.  Doing normal activities.  You chest hurts for longer than 5 minutes.  Your skin color is more blue than usual.  Your pulse oximeter shows that you have low oxygen for longer than 5 minutes. This information is not intended to replace advice given to you by your health care provider. Make sure you discuss any questions you have with your health care provider. Document Released: 01/09/2008 Document Revised: 12/29/2015 Document Reviewed: 03/19/2013 Elsevier Interactive Patient Education  2017 [employer].

## 2016-08-10 ENCOUNTER — Telehealth: Payer: Self-pay | Admitting: Internal Medicine

## 2016-08-10 NOTE — Telephone Encounter (Signed)
Pt needs to talk with someone regarding new rx for his inhalers. States it is ok to leave a message.

## 2016-08-13 MED ORDER — ALBUTEROL SULFATE HFA 108 (90 BASE) MCG/ACT IN AERS: 2.0000 | INHALATION_SPRAY | RESPIRATORY_TRACT | 1 refills | Status: DC | PRN

## 2016-08-13 MED ORDER — BUDESONIDE-FORMOTEROL FUMARATE 160-4.5 MCG/ACT IN AERO: 2.0000 | INHALATION_SPRAY | Freq: Two times a day (BID) | RESPIRATORY_TRACT | 3 refills | Status: DC

## 2016-08-13 NOTE — Telephone Encounter (Signed)
Spoke with pt and he needed his inhalers sent to the pharmacy. RXs sent. Nothing further needed.

## 2016-09-13 ENCOUNTER — Encounter: Payer: Self-pay | Admitting: General Surgery

## 2016-09-18 ENCOUNTER — Encounter: Payer: Self-pay | Admitting: General Surgery

## 2016-09-26 ENCOUNTER — Emergency Department
Admission: EM | Admit: 2016-09-26 | Discharge: 2016-09-26 | Disposition: A | Discharge status: Home / Self Care | Payer: Federal, State, Local not specified - PPO | Attending: Emergency Medicine | Admitting: Emergency Medicine

## 2016-09-26 ENCOUNTER — Emergency Department: Payer: Federal, State, Local not specified - PPO

## 2016-09-26 ENCOUNTER — Ambulatory Visit: Payer: Federal, State, Local not specified - PPO | Admitting: Family Medicine

## 2016-09-26 ENCOUNTER — Encounter: Payer: Self-pay | Admitting: Emergency Medicine

## 2016-09-26 DIAGNOSIS — J449 Chronic obstructive pulmonary disease, unspecified: Secondary | ICD-10-CM | POA: Diagnosis not present

## 2016-09-26 DIAGNOSIS — N200 Calculus of kidney: Secondary | ICD-10-CM | POA: Insufficient documentation

## 2016-09-26 DIAGNOSIS — Z87891 Personal history of nicotine dependence: Secondary | ICD-10-CM | POA: Insufficient documentation

## 2016-09-26 DIAGNOSIS — J45909 Unspecified asthma, uncomplicated: Secondary | ICD-10-CM | POA: Diagnosis not present

## 2016-09-26 DIAGNOSIS — Z79899 Other long term (current) drug therapy: Secondary | ICD-10-CM | POA: Insufficient documentation

## 2016-09-26 DIAGNOSIS — I10 Essential (primary) hypertension: Secondary | ICD-10-CM | POA: Insufficient documentation

## 2016-09-26 DIAGNOSIS — N201 Calculus of ureter: Secondary | ICD-10-CM | POA: Diagnosis not present

## 2016-09-26 DIAGNOSIS — I2510 Atherosclerotic heart disease of native coronary artery without angina pectoris: Secondary | ICD-10-CM | POA: Insufficient documentation

## 2016-09-26 DIAGNOSIS — R109 Unspecified abdominal pain: Secondary | ICD-10-CM

## 2016-09-26 DIAGNOSIS — I252 Old myocardial infarction: Secondary | ICD-10-CM | POA: Insufficient documentation

## 2016-09-26 LAB — COMPREHENSIVE METABOLIC PANEL
ALT: 27 U/L (ref 17–63)
ALT: 86 U/L — ABNORMAL HIGH (ref 17–63)
AST: 31 U/L (ref 15–41)
AST: 71 U/L — ABNORMAL HIGH (ref 15–41)
Albumin: 3.9 g/dL (ref 3.5–5.0)
Albumin: 4 g/dL (ref 3.5–5.0)
Alkaline Phosphatase: 78 U/L (ref 38–126)
Alkaline Phosphatase: 85 U/L (ref 38–126)
Anion gap: 7 (ref 5–15)
Anion gap: 10 (ref 5–15)
BUN: 16 mg/dL (ref 6–20)
BUN: 17 mg/dL (ref 6–20)
CO2: 24 mmol/L (ref 22–32)
CO2: 22 mmol/L (ref 22–32)
Calcium: 8.9 mg/dL (ref 8.9–10.3)
Calcium: 9 mg/dL (ref 8.9–10.3)
Chloride: 107 mmol/L (ref 101–111)
Chloride: 105 mmol/L (ref 101–111)
Creatinine, Ser: 1.58 mg/dL — ABNORMAL HIGH (ref 0.61–1.24)
Creatinine, Ser: 1.63 mg/dL — ABNORMAL HIGH (ref 0.61–1.24)
GFR calc Af Amer: 49 mL/min — ABNORMAL LOW (ref >60)
GFR calc Af Amer: 47 mL/min — ABNORMAL LOW (ref >60)
GFR calc non Af Amer: 42 mL/min — ABNORMAL LOW (ref >60)
GFR calc non Af Amer: 41 mL/min — ABNORMAL LOW (ref >60)
Glucose, Bld: 116 mg/dL — ABNORMAL HIGH (ref 65–99)
Glucose, Bld: 133 mg/dL — ABNORMAL HIGH (ref 65–99)
Potassium: 4.4 mmol/L (ref 3.5–5.1)
Potassium: 3.8 mmol/L (ref 3.5–5.1)
Sodium: 138 mmol/L (ref 135–145)
Sodium: 137 mmol/L (ref 135–145)
Total Bilirubin: 1.3 mg/dL — ABNORMAL HIGH (ref 0.3–1.2)
Total Bilirubin: 1.1 mg/dL (ref 0.3–1.2)
Total Protein: 7.1 g/dL (ref 6.5–8.1)
Total Protein: 7.1 g/dL (ref 6.5–8.1)

## 2016-09-26 LAB — CBC WITH DIFFERENTIAL/PLATELET
Basophils Absolute: 0 10*3/uL (ref 0–0.1)
Basophils Absolute: 0 10*3/uL (ref 0–0.1)
Basophils Relative: 0 %
Basophils Relative: 0 %
Eosinophils Absolute: 0.2 10*3/uL (ref 0–0.7)
Eosinophils Absolute: 0 10*3/uL (ref 0–0.7)
Eosinophils Relative: 2 %
Eosinophils Relative: 0 %
HCT: 44.7 % (ref 40.0–52.0)
HEMATOCRIT: 45 % (ref 40.0–52.0)
HEMOGLOBIN: 15.9 g/dL (ref 13.0–18.0)
Hemoglobin: 15 g/dL (ref 13.0–18.0)
LYMPHS ABS: 1.5 10*3/uL (ref 1.0–3.6)
Lymphocytes Relative: 14 %
Lymphocytes Relative: 7 %
Lymphs Abs: 0.9 10*3/uL — ABNORMAL LOW (ref 1.0–3.6)
MCH: 30.8 pg (ref 26.0–34.0)
MCH: 29.4 pg (ref 26.0–34.0)
MCHC: 35.3 g/dL (ref 32.0–36.0)
MCHC: 33.6 g/dL (ref 32.0–36.0)
MCV: 87.2 fL (ref 80.0–100.0)
MCV: 87.5 fL (ref 80.0–100.0)
MONOS PCT: 9 %
Monocytes Absolute: 1 10*3/uL (ref 0.2–1.0)
Monocytes Absolute: 0.8 10*3/uL (ref 0.2–1.0)
Monocytes Relative: 6 %
NEUTROS ABS: 7.9 10*3/uL — AB (ref 1.4–6.5)
Neutro Abs: 11.1 10*3/uL — ABNORMAL HIGH (ref 1.4–6.5)
Neutrophils Relative %: 75 %
Neutrophils Relative %: 87 %
Platelets: 216 10*3/uL (ref 150–440)
Platelets: 228 10*3/uL (ref 150–440)
RBC: 5.16 MIL/uL (ref 4.40–5.90)
RBC: 5.12 MIL/uL (ref 4.40–5.90)
RDW: 13.2 % (ref 11.5–14.5)
RDW: 13.5 % (ref 11.5–14.5)
WBC: 10.6 10*3/uL (ref 3.8–10.6)
WBC: 12.9 10*3/uL — ABNORMAL HIGH (ref 3.8–10.6)

## 2016-09-26 LAB — URINALYSIS, COMPLETE (UACMP) WITH MICROSCOPIC
BACTERIA UA: NONE SEEN
BACTERIA UA: NONE SEEN
Bilirubin Urine: NEGATIVE
Bilirubin Urine: NEGATIVE
Glucose, UA: NEGATIVE mg/dL
Glucose, UA: NEGATIVE mg/dL
Hgb urine dipstick: NEGATIVE
Hgb urine dipstick: NEGATIVE
Ketones, ur: 20 mg/dL — AB
Ketones, ur: 20 mg/dL — AB
Leukocytes, UA: NEGATIVE
Leukocytes, UA: NEGATIVE
NITRITE: NEGATIVE
NITRITE: NEGATIVE
PH: 5 (ref 5.0–8.0)
Protein, ur: NEGATIVE mg/dL
Protein, ur: NEGATIVE mg/dL
SQUAMOUS EPITHELIAL / LPF: NONE SEEN
Specific Gravity, Urine: 1.024 (ref 1.005–1.030)
Specific Gravity, Urine: 1.02 (ref 1.005–1.030)
Squamous Epithelial / LPF: NONE SEEN
pH: 5 (ref 5.0–8.0)

## 2016-09-26 LAB — LIPASE, BLOOD: Lipase: 25 U/L (ref 11–51)

## 2016-09-26 MED ORDER — PROCHLORPERAZINE EDISYLATE 5 MG/ML IJ SOLN
10.0000 mg | Freq: Once | INTRAMUSCULAR | Status: AC
  Administered 2016-09-26: 10 mg via INTRAVENOUS

## 2016-09-26 MED ORDER — LIDOCAINE HCL (PF) 1 % IJ SOLN
Freq: Once | INTRAVENOUS | Status: AC
  Administered 2016-09-26: 21:00:00 via INTRAVENOUS
  Filled 2016-09-26: qty 50

## 2016-09-26 MED ORDER — ONDANSETRON HCL 4 MG/2ML IJ SOLN
INTRAMUSCULAR | Status: AC
  Administered 2016-09-26: 4 mg via INTRAVENOUS
  Filled 2016-09-26: qty 2

## 2016-09-26 MED ORDER — SODIUM CHLORIDE 0.9 % IV BOLUS (SEPSIS)
1000.0000 mL | Freq: Once | INTRAVENOUS | Status: AC
  Administered 2016-09-26: 1000 mL via INTRAVENOUS

## 2016-09-26 MED ORDER — TAMSULOSIN HCL 0.4 MG PO CAPS: 0.4000 mg | ORAL_CAPSULE | Freq: Every day | ORAL | 0 refills | Status: DC

## 2016-09-26 MED ORDER — LIDOCAINE HCL (PF) 1 % IJ SOLN
150.0000 mg | INTRAVENOUS | Status: DC
  Filled 2016-09-26 (×78): qty 50

## 2016-09-26 MED ORDER — DIPHENHYDRAMINE HCL 50 MG/ML IJ SOLN
50.0000 mg | Freq: Once | INTRAMUSCULAR | Status: AC
  Administered 2016-09-26: 50 mg via INTRAVENOUS
  Filled 2016-09-26: qty 1

## 2016-09-26 MED ORDER — ONDANSETRON HCL 4 MG/2ML IJ SOLN
4.0000 mg | Freq: Once | INTRAMUSCULAR | Status: AC
  Administered 2016-09-26: 4 mg via INTRAVENOUS

## 2016-09-26 MED ORDER — HYDROCODONE-ACETAMINOPHEN 5-325 MG PO TABS: 1.0000 | ORAL_TABLET | ORAL | 0 refills | Status: DC | PRN

## 2016-09-26 MED ORDER — MORPHINE SULFATE (PF) 4 MG/ML IV SOLN
INTRAVENOUS | Status: AC
  Administered 2016-09-26: 4 mg via INTRAVENOUS
  Filled 2016-09-26: qty 1

## 2016-09-26 MED ORDER — MORPHINE SULFATE (PF) 4 MG/ML IV SOLN
4.0000 mg | Freq: Once | INTRAVENOUS | Status: AC
  Administered 2016-09-26: 4 mg via INTRAVENOUS

## 2016-09-26 MED ORDER — LIDOCAINE HCL (CARDIAC) 20 MG/ML IV SOLN
150.0000 mg | Freq: Once | INTRAVENOUS | Status: DC
  Filled 2016-09-26: qty 10

## 2016-09-26 MED ORDER — MORPHINE SULFATE (PF) 4 MG/ML IV SOLN
4.0000 mg | Freq: Once | INTRAVENOUS | Status: AC
  Administered 2016-09-26: 4 mg via INTRAVENOUS
  Filled 2016-09-26: qty 1

## 2016-09-26 MED ORDER — MORPHINE SULFATE (PF) 4 MG/ML IV SOLN
INTRAVENOUS | Status: AC
  Filled 2016-09-26: qty 1

## 2016-09-26 MED ORDER — OXYCODONE HCL 5 MG PO TABS
5.0000 mg | ORAL_TABLET | Freq: Once | ORAL | Status: AC
  Administered 2016-09-26: 5 mg via ORAL
  Filled 2016-09-26: qty 1

## 2016-09-26 MED ORDER — ONDANSETRON HCL 4 MG PO TABS: 4.0000 mg | ORAL_TABLET | Freq: Every day | ORAL | 0 refills | Status: DC | PRN

## 2016-09-26 MED ORDER — PROCHLORPERAZINE EDISYLATE 5 MG/ML IJ SOLN
INTRAMUSCULAR | Status: AC
  Filled 2016-09-26: qty 2

## 2016-09-26 NOTE — Discharge Instructions (Signed)
Please call Dr. Tresa Moore first thing in the morning to see him in clinic tomorrow. I have spoken with him tonight and he is expecting to see her tomorrow. Return to the emergency department sooner for any new or worsening symptoms

## 2016-09-26 NOTE — ED Notes (Signed)
Pt resting in bed, wife at bedside, awake and alert in no acute distress

## 2016-09-26 NOTE — ED Provider Notes (Signed)
[facility] Emergency Department Provider Note  ____________________________________________   First MD Initiated Contact with Patient 09/26/16 1802     (approximate)  I have reviewed the triage vital signs and the nursing notes.   HISTORY  Chief Complaint Nausea; Emesis; and Nephrolithiasis    HPI Willie Crane is a 71 y.o. male who presents to the emergency department with severe left flank pain radiating to his left groin for the past 3 days. He was seen in our emergency department earlier this morning where he had a CT scan noncontrast showing a 1 cm mid ureter stone on the left. When he was discharged this morning she was feeling well but once the morphine wore off his pain became intense. He has been extremely nauseated and unable to take his pain medication secondary to vomiting. This is his second lifetime stone.   Past Medical History:  Diagnosis Date  . Anxiety   . Asthma   . Benign paroxysmal positional vertigo   . BPH (benign prostatic hypertrophy)   . Complication of anesthesia   . COPD (chronic obstructive pulmonary disease) ([place])   . Coronary artery disease    history of heart attack  . Degeneration of lumbar or lumbosacral intervertebral disc   . History of kidney stones   . Hyperlipidemia   . Hypertension   . MI (myocardial infarction)    1980s  . Obesity, unspecified   . PONV (postoperative nausea and vomiting)   . PTSD (post-traumatic stress disorder)   . Thoracic or lumbosacral neuritis or radiculitis, unspecified   . Unspecified congenital anomaly of the integument   . Unspecified gastritis and gastroduodenitis without mention of hemorrhage     Patient Active Problem List   Diagnosis Date Noted  . Nephrolithiasis 09/28/2016  . Bilateral Simple Renal Cysts 09/28/2016  . History of acute myocardial infarction 09/27/2016  . Prolonged Q-T interval on ECG 09/27/2016  . Nausea after anesthesia 01/26/2016  . Obesity  12/27/2015  . Medication monitoring encounter 12/27/2015  . Prostate cancer screening   . Benign neoplasm of transverse colon   . Encounter for screening for malignant neoplasm of colon 10/21/2015  . Moderate persistent asthma 09/28/2015  . 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 Hydrocele, bilateral 07/12/2015  . Hyperlipidemia   . Hypertension     Past Surgical History:  Procedure Laterality Date  . BACK SURGERY    . CARDIAC CATHETERIZATION  03/2009   [facility]: No significant coronary artery disease  . CARDIAC CATHETERIZATION  01/2014   [facility]  . CARPAL TUNNEL RELEASE Right   . CHOLECYSTECTOMY    . COLONOSCOPY    . COLONOSCOPY WITH PROPOFOL N/A 10/31/2015   Procedure: COLONOSCOPY WITH PROPOFOL;  Surgeon: Lucilla Lame, MD;  Location: [place];  Service: Endoscopy;  Laterality: N/A;  . CYST EXCISION    . HERNIA REPAIR    . INGUINAL HERNIA REPAIR Right   . INGUINAL HERNIA REPAIR Left 01/26/2016   Procedure: HERNIA REPAIR INGUINAL ADULT;  Surgeon: Christene Lye, MD;  Location: [facility];  Service: General;  Laterality: Left;  . KNEE ARTHROSCOPY    . LUMBAR LAMINECTOMY    . POLYPECTOMY  10/31/2015   Procedure: POLYPECTOMY;  Surgeon: Lucilla Lame, MD;  Location: [place];  Service: Endoscopy;;  . VASECTOMY      Prior to Admission medications   Medication Sig Start Date End Date Taking? Authorizing Provider  albuterol (PROVENTIL HFA) 108 (90 Base) MCG/ACT inhaler Inhale  2 puffs into the lungs every 4 (four) hours as needed for wheezing or shortness of breath. 08/13/16  Yes Flora Lipps, MD  ascorbic acid (VITAMIN C) 250 MG CHEW Chew 500 mg by mouth daily.   Yes Historical Provider, MD  budesonide-formoterol (SYMBICORT) 160-4.5 MCG/ACT inhaler Inhale 2 puffs into the lungs 2 (two) times daily. 08/13/16  Yes Flora Lipps, MD  finasteride (PROSCAR) 5 MG tablet Take 1 tablet (5 mg total) by mouth daily. 07/10/16  Yes Shannon A McGowan, PA-C    FLUoxetine (PROZAC) 20 MG capsule TAKE 1 CAPSULE (20 MG TOTAL) BY MOUTH DAILY. 06/30/16  Yes Arnetha Courser, MD  Multiple Vitamins-Minerals (MULTI ADULT GUMMIES) CHEW Chew 1 tablet by mouth daily.   Yes Historical Provider, MD  olmesartan (BENICAR) 20 MG tablet Take 1 tablet (20 mg total) by mouth daily. 07/02/16  Yes Arnetha Courser, MD  tamsulosin (FLOMAX) 0.4 MG CAPS capsule Take 1 capsule (0.4 mg total) by mouth daily after supper. Patient not taking: Reported on 09/28/2016 09/26/16  Yes Merlyn Lot, MD  HYDROcodone-acetaminophen [facility]) 5-325 MG tablet Take 1 tablet by mouth every 4 (four) hours as needed for moderate pain. 09/27/16   Arnetha Courser, MD  oxyCODONE-acetaminophen (ROXICET) 5-325 MG tablet Take 1-2 tablets by mouth every 6 (six) hours as needed for severe pain. From kidney stone 09/28/16 09/28/17  Alexis Frock, MD  promethazine (PHENERGAN) 25 MG tablet Take 1 tablet (25 mg total) by mouth every 6 (six) hours as needed for nausea or vomiting. 09/27/16   Arnetha Courser, MD    Allergies Other  Family History  Problem Relation Age of Onset  . Kidney failure Mother   . Cancer Mother   . Cancer Father     lung  . Prostate cancer Neg Hx     Social History Social History  Substance Use Topics  . Smoking status: Former Smoker    Packs/day: 1.00    Years: 20.00    Types: Cigarettes    Quit date: 01/19/1979  . Smokeless tobacco: Former Systems developer    Types: Chew     Comment: quit smoking 30 years  . Alcohol use 0.0 oz/week     Comment: wine 1x/mo    Review of Systems Constitutional: No fever/chills Eyes: No visual changes. ENT: No sore throat. Cardiovascular: Denies chest pain. Respiratory: Denies shortness of breath. Gastrointestinal: Positive flank pain positive nausea Genitourinary: Negative for dysuria. Musculoskeletal: Negative for back pain. Skin: Negative for rash. Neurological: Negative for headaches, focal weakness or numbness.  10-point ROS otherwise  negative.  ____________________________________________   PHYSICAL EXAM:  VITAL SIGNS: ED Triage Vitals [09/26/16 1739]  Enc Vitals Group     BP (!) 165/112     Pulse Rate 98     Resp 18     Temp 97.9 F (36.6 C)     Temp Source Oral     SpO2 100 %     Weight 230 lb (104.3 kg)     Height 5\' 7"  (1.702 m)     Head Circumference      Peak Flow      Pain Score 9     Pain Loc      Pain Edu?      Excl. in [place]?     Constitutional: Alert and oriented 4 appears uncomfortable no diaphoresis Eyes: Conjunctivae are normal. PERRL. EOMI. Head: Atraumatic. Nose: No congestion/rhinnorhea. Mouth/Throat: Mucous membranes are moist.  Oropharynx non-erythematous. Neck: No stridor.   Cardiovascular: Normal  rate, regular rhythm. Grossly normal heart sounds.  Good peripheral circulation. Respiratory: Normal respiratory effort.  No retractions. Lungs CTAB. Gastrointestinal: Soft and nontender. No distention. No abdominal bruits. No CVA tenderness. Musculoskeletal: No lower extremity tenderness nor edema.  No joint effusions. Neurologic:  Normal speech and language. No gross focal neurologic deficits are appreciated. No gait instability. Skin:  Skin is warm, dry and intact. No rash noted. Psychiatric: Mood and affect are normal. Speech and behavior are normal.  ____________________________________________   LABS (all labs ordered are listed, but only abnormal results are displayed)  Labs Reviewed  CBC WITH DIFFERENTIAL/PLATELET - Abnormal; Notable for the following:       Result Value   WBC 12.9 (*)    Neutro Abs 11.1 (*)    Lymphs Abs 0.9 (*)    All other components within normal limits  COMPREHENSIVE METABOLIC PANEL - Abnormal; Notable for the following:    Glucose, Bld 133 (*)    Creatinine, Ser 1.63 (*)    AST 71 (*)    ALT 86 (*)    GFR calc non Af Amer 41 (*)    GFR calc Af Amer 47 (*)    All other components within normal limits  URINALYSIS, COMPLETE (UACMP) WITH  MICROSCOPIC - Abnormal; Notable for the following:    Color, Urine YELLOW (*)    APPearance CLEAR (*)    Ketones, ur 20 (*)    All other components within normal limits   ____________________________________________  EKG   ____________________________________________  RADIOLOGY  CT from earlier today showing a 1 cm stone ____________________________________________   PROCEDURES  Procedure(s) performed: no  Procedures  Critical Care performed: no  ____________________________________________   INITIAL IMPRESSION / ASSESSMENT AND PLAN / ED COURSE  Pertinent labs & imaging results that were available during my care of the patient were reviewed by me and considered in my medical decision making (see chart for details).  On arrival the patient is in a significant amount of pain but fortunately is afebrile. He had a CT earlier showing a stone but his pain was not adequately controlled. IV hydration and antiemetics and pain control now.    ----------------------------------------- 7:23 PM on 09/26/2016 -----------------------------------------  I discussed the case with on-call urologist Dr. Tresa Moore who recommends hydration, antiemetics, and pain control and he will take care of the patient in clinic tomorrow. ____________________________________________  ----------------------------------------- 9:21 PM on 09/26/2016 -----------------------------------------  The patient's pain is under control and he is resting comfortably. He is medically stable for outpatient management.   FINAL CLINICAL IMPRESSION(S) / ED DIAGNOSES  Final diagnoses:  Kidney stone      NEW MEDICATIONS STARTED DURING THIS VISIT:  Discharge Medication List as of 09/26/2016  9:20 PM       Note:  This document was prepared using Dragon voice recognition software and may include unintentional dictation errors.     Darel Hong, MD 09/29/16 1251

## 2016-09-26 NOTE — ED Provider Notes (Signed)
Patient received in sign-out from Dr. Owens Shark.  Workup and evaluation pending urinalysis. Patient presented with acute left flank pain with CT imaging and evaluation consistent with left ureterolithiasis. Blood work is otherwise reassuring. His urinalysis does not show any evidence of infection and the patient has no leukocytosis with no fever. His pain is been controlled in the ER. Patient is stable for close follow-up with urology.  Patient was able to tolerate PO and was able to ambulate with a steady gait.  Have discussed with the patient and available family all diagnostics and treatments performed thus far and all questions were answered to the best of my ability. The patient demonstrates understanding and agreement with plan. Merlyn Lot, MD 09/26/16 [phone]

## 2016-09-26 NOTE — ED Triage Notes (Signed)
Pt in ER earlier today and dx with kidney stone. Pt unable to keep pain or nausea medications down due to nausea and emesis.

## 2016-09-26 NOTE — ED Notes (Signed)
Report to Olivia, RN

## 2016-09-26 NOTE — ED Notes (Signed)
Pt was seen earlier in ER today with kidney stone.  Pt reports nausea and pain since leaving the ER at 1100am today.  Pt voiding without diff.  Pt alert.

## 2016-09-26 NOTE — ED Notes (Signed)
Pt sleepy now after meds.  siderails up x 2.  Family with pt.  States pain improved.

## 2016-09-26 NOTE — ED Notes (Signed)
D/c inst to pt  Iv d'ed from [place].

## 2016-09-26 NOTE — ED Provider Notes (Signed)
[facility] Emergency Department Provider Note   First MD Initiated Contact with Patient 09/26/16 [phone]     (approximate)  I have reviewed the triage vital signs and the nursing notes.   HISTORY  Chief Complaint Flank Pain    HPI Willie Crane is a 71 y.o. male with below of chronic medical conditions presents with three-day history of intermittent left flank pain currently 10 out of 10. Patient denies any fever no hematuria or dysuria. Patient admits to nausea however no vomiting   Past Medical History:  Diagnosis Date  . Actinic keratosis   . Anxiety   . Asthma   . Benign paroxysmal positional vertigo   . BPH (benign prostatic hypertrophy)   . Complication of anesthesia   . COPD (chronic obstructive pulmonary disease) ([place])   . Degeneration of lumbar or lumbosacral intervertebral disc   . History of kidney stones   . Hydrocele   . Hyperlipidemia   . Hypertension   . Inguinal hernia without mention of obstruction or gangrene, unilateral or unspecified, (not specified as recurrent)   . MI (myocardial infarction)    1980s  . Obesity, unspecified   . Pain in joint, lower leg   . PONV (postoperative nausea and vomiting)   . Pruritus ani   . PTSD (post-traumatic stress disorder)   . Testicular swelling   . Thoracic or lumbosacral neuritis or radiculitis, unspecified   . Unspecified congenital anomaly of the integument   . Unspecified gastritis and gastroduodenitis without mention of hemorrhage     Patient Active Problem List   Diagnosis Date Noted  . Nausea after anesthesia 01/26/2016  . Obesity 12/27/2015  . Medication monitoring encounter 12/27/2015  . Chronic left lower quadrant pain 12/27/2015  . Inguinal hernia 12/26/2015  . Special screening for malignant neoplasms, colon   . Benign neoplasm of transverse colon   . Encounter for screening for malignant neoplasm of colon 10/21/2015  . Moderate persistent asthma 09/28/2015  . BPH  with obstruction/lower urinary tract symptoms 07/12/2015  . Hydrocele, bilateral 07/12/2015  . Hyperlipidemia   . Hypertension     Past Surgical History:  Procedure Laterality Date  . BACK SURGERY    . CARDIAC CATHETERIZATION  03/2009   [facility]: No significant coronary artery disease  . CARDIAC CATHETERIZATION  01/2014   [facility]  . CARPAL TUNNEL RELEASE Right   . CHOLECYSTECTOMY    . COLONOSCOPY    . COLONOSCOPY WITH PROPOFOL N/A 10/31/2015   Procedure: COLONOSCOPY WITH PROPOFOL;  Surgeon: Lucilla Lame, MD;  Location: [place];  Service: Endoscopy;  Laterality: N/A;  . CYST EXCISION    . HERNIA REPAIR    . INGUINAL HERNIA REPAIR Right   . INGUINAL HERNIA REPAIR Left 01/26/2016   Procedure: HERNIA REPAIR INGUINAL ADULT;  Surgeon: Christene Lye, MD;  Location: [facility];  Service: General;  Laterality: Left;  . KNEE ARTHROSCOPY    . LUMBAR LAMINECTOMY    . POLYPECTOMY  10/31/2015   Procedure: POLYPECTOMY;  Surgeon: Lucilla Lame, MD;  Location: [place];  Service: Endoscopy;;  . VASECTOMY      Prior to Admission medications   Medication Sig Start Date End Date Taking? Authorizing Provider  albuterol (PROVENTIL HFA) 108 (90 Base) MCG/ACT inhaler Inhale 2 puffs into the lungs every 4 (four) hours as needed for wheezing or shortness of breath. 08/13/16  Yes Flora Lipps, MD  ascorbic acid (VITAMIN C) 250 MG CHEW Chew 500 mg by mouth  daily.   Yes Historical Provider, MD  budesonide-formoterol (SYMBICORT) 160-4.5 MCG/ACT inhaler Inhale 2 puffs into the lungs 2 (two) times daily. 08/13/16  Yes Flora Lipps, MD  finasteride (PROSCAR) 5 MG tablet Take 1 tablet (5 mg total) by mouth daily. 07/10/16  Yes Shannon A McGowan, PA-C  FLUoxetine (PROZAC) 20 MG capsule TAKE 1 CAPSULE (20 MG TOTAL) BY MOUTH DAILY. 06/30/16  Yes Arnetha Courser, MD  Multiple Vitamins-Minerals (MULTI ADULT GUMMIES) CHEW Chew 1 tablet by mouth daily.   Yes Historical Provider, MD  olmesartan (BENICAR) 20 MG  tablet Take 1 tablet (20 mg total) by mouth daily. 07/02/16  Yes Arnetha Courser, MD  famotidine (PEPCID) 20 MG tablet Take 1 tablet (20 mg total) by mouth 2 (two) times daily. Patient not taking: Reported on 09/26/2016 05/18/16   Earleen Newport, MD  HYDROcodone-acetaminophen [facility]) 5-325 MG tablet Take 1 tablet by mouth every 4 (four) hours as needed for moderate pain. 09/26/16   Merlyn Lot, MD  ondansetron [facility]) 4 MG tablet Take 1 tablet (4 mg total) by mouth daily as needed for nausea or vomiting. 09/26/16 09/26/17  Merlyn Lot, MD  pantoprazole (PROTONIX) 40 MG tablet Take 1 tablet (40 mg total) by mouth daily. Patient not taking: Reported on 09/26/2016 05/18/16 05/18/17  Earleen Newport, MD  tamsulosin (FLOMAX) 0.4 MG CAPS capsule Take 1 capsule (0.4 mg total) by mouth daily after supper. 09/26/16   Merlyn Lot, MD    Allergies Other  Family History  Problem Relation Age of Onset  . Kidney failure Mother   . Prostate cancer Neg Hx     Social History Social History  Substance Use Topics  . Smoking status: Former Smoker    Packs/day: 1.00    Years: 20.00    Types: Cigarettes    Quit date: 01/19/1979  . Smokeless tobacco: Former Systems developer    Types: Chew     Comment: quit smoking 30 years  . Alcohol use 0.0 oz/week     Comment: wine 1x/mo    Review of Systems Constitutional: No fever/chills Eyes: No visual changes. ENT: No sore throat. Cardiovascular: Denies chest pain. Respiratory: Denies shortness of breath. Gastrointestinal: No abdominal pain.  No nausea, no vomiting.  No diarrhea.  No constipation. Genitourinary: Negative for dysuria. Musculoskeletal: Positive for left flank pain Skin: Negative for rash. Neurological: Negative for headaches, focal weakness or numbness.  10-point ROS otherwise negative.  ____________________________________________   PHYSICAL EXAM:  VITAL SIGNS: ED Triage Vitals  Enc Vitals Group     BP 09/26/16 0639 (!)  160/102     Pulse Rate 09/26/16 0639 91     Resp 09/26/16 0639 20     Temp 09/26/16 0639 97.6 F (36.4 C)     Temp Source 09/26/16 0639 Oral     SpO2 09/26/16 0639 96 %     Weight 09/26/16 0633 230 lb (104.3 kg)     Height 09/26/16 0633 5\' 7"  (1.702 m)     Head Circumference --      Peak Flow --      Pain Score 09/26/16 0632 9     Pain Loc --      Pain Edu? --      Excl. in [place]? --     Constitutional: Alert and oriented. Well appearing and in no acute distress. Eyes: Conjunctivae are normal. PERRL. EOMI. Head: Atraumatic. Mouth/Throat: Mucous membranes are moist.  Oropharynx non-erythematous. Neck: No stridor. Cardiovascular: Normal rate, regular rhythm. Good  peripheral circulation. Grossly normal heart sounds. Respiratory: Normal respiratory effort.  No retractions. Lungs CTAB. Gastrointestinal: Soft and nontender. No distention. Musculoskeletal: No lower extremity tenderness nor edema. No gross deformities of extremities. Neurologic:  Normal speech and language. No gross focal neurologic deficits are appreciated.  Skin:  Skin is warm, dry and intact. No rash noted. Psychiatric: Mood and affect are normal. Speech and behavior are normal.  ____________________________________________   LABS (all labs ordered are listed, but only abnormal results are displayed)  Labs Reviewed  CBC WITH DIFFERENTIAL/PLATELET - Abnormal; Notable for the following:       Result Value   Neutro Abs 7.9 (*)    All other components within normal limits  COMPREHENSIVE METABOLIC PANEL - Abnormal; Notable for the following:    Glucose, Bld 116 (*)    Creatinine, Ser 1.58 (*)    Total Bilirubin 1.3 (*)    GFR calc non Af Amer 42 (*)    GFR calc Af Amer 49 (*)    All other components within normal limits  URINALYSIS, COMPLETE (UACMP) WITH MICROSCOPIC - Abnormal; Notable for the following:    Color, Urine YELLOW (*)    APPearance CLEAR (*)    Ketones, ur 20 (*)    All other components within  normal limits  LIPASE, BLOOD    RADIOLOGY I, [place] Kelsye Loomer, personally viewed and evaluated these images (plain radiographs) as part of my medical decision making, as well as reviewing the written report by the radiologist.  CLINICAL DATA:  Left flank pain with nausea  EXAM: CT ABDOMEN AND PELVIS WITHOUT CONTRAST  TECHNIQUE: Multidetector CT imaging of the abdomen and pelvis was performed following the standard protocol without oral or intravenous contrast material administration.  COMPARISON:  January 05, 2016  FINDINGS: Lower chest: Lung bases are clear. There is incomplete visualization of a 1.4 x 0.8 cm opacity abutting or possibly arising from an anterior lower rib on the right seen on the initial axial CT slice.  Hepatobiliary: There is hepatic steatosis. No focal liver lesions are evident on this noncontrast enhanced study. Gallbladder is absent. There is no biliary duct dilatation.  Pancreas: There is mild fatty infiltration in the pancreas. No pancreatic mass or inflammatory focus.  Spleen: No splenic lesions are evident.  Adrenals/Urinary Tract: Adrenals appear normal bilaterally. There is a cyst arising from the lateral right mid kidney measuring 13.4 x 8.7 cm. There is a cyst arising from the lower pole the right kidney measuring 4.3 x 4.1 cm. There is a cyst arising from the lateral mid left kidney measuring 6.1 x 4.9 cm. There is a 1 x 1 cm cyst more posteriorly located in the mid left kidney as well. There is moderate hydronephrosis on the left with mild perinephric stranding on the left. There is no hydronephrosis on the right. There is a 1 mm calculus in the anterior mid right kidney. There is no intrarenal calculus on the left. There is a calculus in the left ureter at the level of L4 causing hydronephrosis measuring 1.0 x 0.6 cm. No other ureteral calculi are appreciable. Urinary bladder is midline with wall thickness within normal  limits.  Stomach/Bowel: There are occasional scattered colonic diverticular without diverticulitis. There is no bowel wall or mesenteric thickening. No evident bowel obstruction. No free air or portal venous air.  Vascular/Lymphatic: There is no abdominal aortic aneurysm. No vascular calcifications are evident on this noncontrast enhanced study. No adenopathy is appreciable in the abdomen or pelvis.  Reproductive: There are a few scattered prostatic calculi. Prostate and seminal vesicles are normal in size and contour. No pelvic mass.  Other: Appendix appears unremarkable. No abscess or ascites is evident in the abdomen or pelvis. There is fat in each inguinal ring. There is soft tissue stranding in the left inguinal region, possibly related to postoperative change.  Musculoskeletal: There is degenerative change in the lower lumbar spine. No blastic or lytic bone lesions. There is no abdominal wall or intramuscular lesion.  IMPRESSION: 1.0 x 0.6 cm calculus in the left ureter at the level of L4 causing moderate hydronephrosis and proximal ureterectasis on the left. There is perinephric stranding on the left.  1 mm nonobstructing calculus right kidney.  Prominent renal cysts bilaterally. Largest renal cyst is on the right measuring 13.4 x 8.7 cm.  Incomplete visualization of opacity abutting or possibly arising from an anterior lower right rib seen on the initial CT slice. Etiology for this apparent soft tissue mass is uncertain. This finding may warrant complete chest CT for further assessment. Adjacent pleura in this area does not appear thickened.  Occasional prostatic calculi.  Occasional colonic diverticular out diverticulitis. No bowel obstruction. No abscess. Appendix region appears normal.   Electronically Signed   By: Lowella Grip III M.D.   On: 09/26/2016 07:16   Procedures    INITIAL IMPRESSION / ASSESSMENT AND PLAN / ED  COURSE  Pertinent labs & imaging results that were available during my care of the patient were reviewed by me and considered in my medical decision making (see chart for details).       ____________________________________________  FINAL CLINICAL IMPRESSION(S) / ED DIAGNOSES  Final diagnoses:  Ureterolithiasis  Acute left flank pain     MEDICATIONS GIVEN DURING THIS VISIT:  Medications  prochlorperazine (COMPAZINE) 5 MG/ML injection (not administered)  morphine 4 MG/ML injection (not administered)  morphine 4 MG/ML injection 4 mg (4 mg Intravenous Given 09/26/16 0646)  ondansetron (ZOFRAN) injection 4 mg (4 mg Intravenous Given 09/26/16 0646)  sodium chloride 0.9 % bolus 1,000 mL (0 mLs Intravenous Stopped 09/26/16 1000)  morphine 4 MG/ML injection 4 mg (4 mg Intravenous Given 09/26/16 0727)  oxyCODONE (Oxy IR/ROXICODONE) immediate release tablet 5 mg (5 mg Oral Given 09/26/16 1003)     NEW OUTPATIENT MEDICATIONS STARTED DURING THIS VISIT:  Discharge Medication List as of 09/26/2016  9:40 AM    START taking these medications   Details  HYDROcodone-acetaminophen (NORCO) 5-325 MG tablet Take 1 tablet by mouth every 4 (four) hours as needed for moderate pain., Starting Wed 09/26/2016, Print    ondansetron (ZOFRAN) 4 MG tablet Take 1 tablet (4 mg total) by mouth daily as needed for nausea or vomiting., Starting Wed 09/26/2016, Until Thu 09/26/2017, Print    tamsulosin (FLOMAX) 0.4 MG CAPS capsule Take 1 capsule (0.4 mg total) by mouth daily after supper., Starting Wed 09/26/2016, Print        Discharge Medication List as of 09/26/2016  9:40 AM      Discharge Medication List as of 09/26/2016  9:40 AM       Note:  This document was prepared using Dragon voice recognition software and may include unintentional dictation errors.    Gregor Hams, MD 09/27/16 0800

## 2016-09-26 NOTE — ED Notes (Signed)
Iv meds given.  Iv fluids infusing.  Family with pt.

## 2016-09-26 NOTE — ED Triage Notes (Signed)
Patient ambulatory to triage with steady gait, without difficulty or distress noted; pt reports left flank pain radiating at times to left lower abd accomp by nausea x 3 days; denies hx of same

## 2016-09-27 ENCOUNTER — Ambulatory Visit (INDEPENDENT_AMBULATORY_CARE_PROVIDER_SITE_OTHER): Payer: Federal, State, Local not specified - PPO | Admitting: Family Medicine

## 2016-09-27 ENCOUNTER — Encounter: Payer: Self-pay | Admitting: Family Medicine

## 2016-09-27 ENCOUNTER — Other Ambulatory Visit
Admission: RE | Admit: 2016-09-27 | Discharge: 2016-09-27 | Disposition: A | Discharge status: Home / Self Care | Payer: Federal, State, Local not specified - PPO | Source: Ambulatory Visit | Attending: Family Medicine | Admitting: Family Medicine

## 2016-09-27 VITALS — BP 140/82 | HR 98 | Temp 97.7°F | Resp 16 | Wt 234.6 lb

## 2016-09-27 DIAGNOSIS — E782 Mixed hyperlipidemia: Secondary | ICD-10-CM | POA: Insufficient documentation

## 2016-09-27 DIAGNOSIS — Z6836 Body mass index (BMI) 36.0-36.9, adult: Secondary | ICD-10-CM

## 2016-09-27 DIAGNOSIS — J4540 Moderate persistent asthma, uncomplicated: Secondary | ICD-10-CM

## 2016-09-27 DIAGNOSIS — D72828 Other elevated white blood cell count: Secondary | ICD-10-CM | POA: Insufficient documentation

## 2016-09-27 DIAGNOSIS — I252 Old myocardial infarction: Secondary | ICD-10-CM | POA: Diagnosis not present

## 2016-09-27 DIAGNOSIS — N200 Calculus of kidney: Secondary | ICD-10-CM

## 2016-09-27 DIAGNOSIS — I10 Essential (primary) hypertension: Secondary | ICD-10-CM

## 2016-09-27 DIAGNOSIS — Z5181 Encounter for therapeutic drug level monitoring: Secondary | ICD-10-CM | POA: Diagnosis not present

## 2016-09-27 DIAGNOSIS — E6609 Other obesity due to excess calories: Secondary | ICD-10-CM | POA: Insufficient documentation

## 2016-09-27 DIAGNOSIS — R938 Abnormal findings on diagnostic imaging of other specified body structures: Secondary | ICD-10-CM

## 2016-09-27 DIAGNOSIS — R9431 Abnormal electrocardiogram [ECG] [EKG]: Secondary | ICD-10-CM

## 2016-09-27 DIAGNOSIS — Z79899 Other long term (current) drug therapy: Secondary | ICD-10-CM | POA: Insufficient documentation

## 2016-09-27 DIAGNOSIS — N132 Hydronephrosis with renal and ureteral calculous obstruction: Secondary | ICD-10-CM

## 2016-09-27 DIAGNOSIS — R9389 Abnormal findings on diagnostic imaging of other specified body structures: Secondary | ICD-10-CM

## 2016-09-27 DIAGNOSIS — R824 Acetonuria: Secondary | ICD-10-CM | POA: Diagnosis not present

## 2016-09-27 DIAGNOSIS — N131 Hydronephrosis with ureteral stricture, not elsewhere classified: Secondary | ICD-10-CM

## 2016-09-27 LAB — CBC WITH DIFFERENTIAL/PLATELET
BASOS ABS: 0 10*3/uL (ref 0–0.1)
Basophils Relative: 0 %
Eosinophils Absolute: 0.1 10*3/uL (ref 0–0.7)
Eosinophils Relative: 1 %
HEMATOCRIT: 42.3 % (ref 40.0–52.0)
HEMOGLOBIN: 14.4 g/dL (ref 13.0–18.0)
LYMPHS ABS: 1.6 10*3/uL (ref 1.0–3.6)
LYMPHS PCT: 15 %
MCH: 30.1 pg (ref 26.0–34.0)
MCHC: 34 g/dL (ref 32.0–36.0)
MCV: 88.5 fL (ref 80.0–100.0)
Monocytes Absolute: 1.2 10*3/uL — ABNORMAL HIGH (ref 0.2–1.0)
Monocytes Relative: 11 %
NEUTROS ABS: 7.8 10*3/uL — AB (ref 1.4–6.5)
Neutrophils Relative %: 73 %
Platelets: 193 10*3/uL (ref 150–440)
RBC: 4.78 MIL/uL (ref 4.40–5.90)
RDW: 13.1 % (ref 11.5–14.5)
WBC: 10.7 10*3/uL — AB (ref 3.8–10.6)

## 2016-09-27 LAB — COMPREHENSIVE METABOLIC PANEL
ALK PHOS: 79 U/L (ref 38–126)
ALT: 61 U/L (ref 17–63)
AST: 39 U/L (ref 15–41)
Albumin: 3.8 g/dL (ref 3.5–5.0)
Anion gap: 8 (ref 5–15)
BILIRUBIN TOTAL: 1 mg/dL (ref 0.3–1.2)
BUN: 19 mg/dL (ref 6–20)
CALCIUM: 8.8 mg/dL — AB (ref 8.9–10.3)
CHLORIDE: 104 mmol/L (ref 101–111)
CO2: 26 mmol/L (ref 22–32)
CREATININE: 1.87 mg/dL — AB (ref 0.61–1.24)
GFR, EST AFRICAN AMERICAN: 40 mL/min — AB (ref >60)
GFR, EST NON AFRICAN AMERICAN: 35 mL/min — AB (ref >60)
Glucose, Bld: 92 mg/dL (ref 65–99)
Potassium: 3.9 mmol/L (ref 3.5–5.1)
Sodium: 138 mmol/L (ref 135–145)
TOTAL PROTEIN: 7 g/dL (ref 6.5–8.1)

## 2016-09-27 LAB — POCT URINALYSIS DIPSTICK
BILIRUBIN UA: NEGATIVE
Glucose, UA: NEGATIVE
KETONES UA: NEGATIVE
LEUKOCYTES UA: NEGATIVE
NITRITE UA: NEGATIVE
PH UA: 5.5
PROTEIN UA: NEGATIVE
Spec Grav, UA: 1.02
Urobilinogen, UA: 0.2

## 2016-09-27 LAB — LIPID PANEL
Cholesterol: 169 mg/dL (ref 0–200)
HDL: 59 mg/dL (ref >40)
LDL Cholesterol: 91 mg/dL (ref 0–99)
Total CHOL/HDL Ratio: 2.9 RATIO
Triglycerides: 96 mg/dL (ref <150)
VLDL: 19 mg/dL (ref 0–40)

## 2016-09-27 MED ORDER — PROMETHAZINE HCL 25 MG PO TABS: 25.0000 mg | ORAL_TABLET | Freq: Four times a day (QID) | ORAL | 0 refills | Status: DC | PRN

## 2016-09-27 MED ORDER — HYDROCODONE-ACETAMINOPHEN 5-325 MG PO TABS: 1.0000 | ORAL_TABLET | ORAL | 0 refills | Status: DC | PRN

## 2016-09-27 NOTE — Assessment & Plan Note (Signed)
I noticed prolonged QTc interval on last two EKGs; advised patient to stop the ondansetron and we'll use promethazine only if needed; we tried to contact his cardiologist, but could not get through today

## 2016-09-27 NOTE — Assessment & Plan Note (Signed)
Check labs 

## 2016-09-27 NOTE — Assessment & Plan Note (Addendum)
I explained to patient that he should most likely be taking an 81 mg aspirin daily; he will discuss aspirin with Dr. Fletcher Anon and get in to see him soon; weight loss key; check lipids

## 2016-09-27 NOTE — Assessment & Plan Note (Signed)
Work on weight loss.

## 2016-09-27 NOTE — Patient Instructions (Addendum)
Please have labs done at the hospital and expect a call from me this evening Hydration encourged Check out the information at familydoctor.org entitled "Nutrition for Weight Loss: What You Need to Know about Fad Diets" Try to lose between 1-2 pounds per week by taking in fewer calories and burning off more calories You can succeed by limiting portions, limiting foods dense in calories and fat, becoming more active, and drinking 8 glasses of water a day (64 ounces) Don't skip meals, especially breakfast, as skipping meals may alter your metabolism Do not use over-the-counter weight loss pills or gimmicks that claim rapid weight loss A healthy BMI (or body mass index) is between 18.5 and 24.9 You can calculate your ideal BMI at the [place] website [employer]fr If you get worse, please go to the emergency department Stop the ondansetron Use the promethazine if needed Do follow up with Willie Crane Anon and ask him if you should be on an aspirin daily (I think you should be, 81 mg coated once a day)

## 2016-09-27 NOTE — Assessment & Plan Note (Signed)
Followed by pulmonologist 

## 2016-09-27 NOTE — Assessment & Plan Note (Addendum)
Check lipids; goal LDL less less than 70

## 2016-09-27 NOTE — Assessment & Plan Note (Signed)
Urge DASH guideline; avoid decongestants

## 2016-09-27 NOTE — Progress Notes (Signed)
BP 140/82   Pulse 98   Temp 97.7 F (36.5 C) (Oral)   Resp 16   Wt 234 lb 9.6 oz (106.4 kg)   SpO2 98%   BMI 36.74 kg/m    Subjective:    Patient ID: Willie Crane, male    DOB: 03-08-1946, 71 y.o.   MRN: [id number]  HPI: Willie Crane is a 71 y.o. male  Chief Complaint  Patient presents with  . Medication Refill   Patient is here for ER follow-up, needs refill of pain medicine to get him through to urology appointment tomorrow Patient was in the ER twice yesterday for severe left sided abdominal/flank pain; records reviewed Wife says that he was awful yesterday, and he is so much better today; yesterday was a nightmare says his wife; he went to the ER; he had gone for 4-5 days of this and then went to the ER; they did a scan and said he had a kidney stone Was not eating well for a few days before labs done (I asked because ketones were present in the urine) He was taking motrin for headache; just every now and then (I asked because Cr has climbed significantly since last checked) Liver function worsened from prior; he does not take a statin; no alcohol; no other tylenol besides what is in the hydrocodone/apap He was "really dehydrated" First doctor used lidocaine IV for pain control, along with morphine and compazine (wife brought in his check-out / AVS from ER) BP is up a little and he is in 4-5 pain right now; all left flank We reviewed diet; he does eat some salt; can switch to unsalted snacks though when discussed He has a hx of heart attack in the 1980s; he sees Dr. Fletcher Anon, cardiologist; he does not take an aspirin daily; he is not on a statin or beta-blocker apparently; he has not seen Dr. Fletcher Anon in a while per patient's report Wife would like to bring him back in soon for lifestyle discussion; patient admits he loves cookies ---------------------------------------------------------- CT renal stone study dated Sep 26, 2016 IMPRESSION: 1.0 x 0.6 cm calculus in the left  ureter at the level of L4 causing moderate hydronephrosis and proximal ureterectasis on the left. There is perinephric stranding on the left.  1 mm nonobstructing calculus right kidney.  Prominent renal cysts bilaterally. Largest renal cyst is on the right measuring 13.4 x 8.7 cm.  Incomplete visualization of opacity abutting or possibly arising from an anterior lower right rib seen on the initial CT slice. Etiology for this apparent soft tissue mass is uncertain. This finding may warrant complete chest CT for further assessment. Adjacent pleura in this area does not appear thickened.  Occasional prostatic calculi.  Occasional colonic diverticular out diverticulitis. No bowel obstruction. No abscess. Appendix region appears normal.   Electronically Signed   By: Lowella Grip III M.D.   On: 09/26/2016 07:16 ---------------------------------------------------------------- Last labs reviewed, and creatinine, liver enzyme elevated, as well as elev WBC noted; reviewed with patient and his wife Last EKG reviewed as well, noting QTc duration  Depression screen [facility] 2/9 09/27/2016 12/26/2015 10/21/2015 10/21/2015 09/28/2015  Decreased Interest 0 0 0 0 0  Down, Depressed, Hopeless 0 0 0 0 0  PHQ - 2 Score 0 0 0 0 0   Relevant past medical, surgical, family and social history reviewed Past Medical History:  Diagnosis Date  . Anxiety   . Asthma   . Benign paroxysmal positional vertigo   .  BPH (benign prostatic hypertrophy)   . Complication of anesthesia   . COPD (chronic obstructive pulmonary disease) ([place])   . Coronary artery disease    history of heart attack  . Degeneration of lumbar or lumbosacral intervertebral disc   . History of kidney stones   . Hyperlipidemia   . Hypertension   . MI (myocardial infarction)    1980s  . Obesity, unspecified   . PONV (postoperative nausea and vomiting)   . PTSD (post-traumatic stress disorder)   . Thoracic or lumbosacral neuritis  or radiculitis, unspecified   . Unspecified congenital anomaly of the integument   . Unspecified gastritis and gastroduodenitis without mention of hemorrhage    Past Surgical History:  Procedure Laterality Date  . BACK SURGERY    . CARDIAC CATHETERIZATION  03/2009   [facility]: No significant coronary artery disease  . CARD[facility]  [facility]  . CARPAL TUNNEL RELEASE Right   . CHOLECYSTECTOMY    . COLONOSCOPY    . COLONOSCOPY WITH PROPOFOL N/A 10/31/2015   Procedure: COLONOSCOPY WITH PROPOFOL;  Surgeon: Lucilla Lame, MD;  Location: [place];  Service: Endoscopy;  Laterality: N/A;  . CYST EXCISION    . HERNIA REPAIR    . INGUINAL HERNIA REPAIR Right   . INGUINAL HERNIA REPAIR Left 01/26/2016   Procedure: HERNIA REPAIR INGUINAL ADULT;  Surgeon: Christene Lye, MD;  Location: [facility];  Service: General;  Laterality: Left;  . KNEE ARTHROSCOPY    . LUMBAR LAMINECTOMY    . POLYPECTOMY  10/31/2015   Procedure: POLYPECTOMY;  Surgeon: Lucilla Lame, MD;  Location: [place];  Service: Endoscopy;;  . VASECTOMY     Family History  Problem Relation Age of Onset  . Kidney failure Mother   . Cancer Mother   . Cancer Father     lung  . Prostate cancer Neg Hx    Social History  Substance Use Topics  . Smoking status: Former Smoker    Packs/day: 1.00    Years: 20.00    Types: Cigarettes    Quit date: 01/19/1979  . Smokeless tobacco: Former Systems developer    Types: Chew     Comment: quit smoking 30 years  . Alcohol use 0.0 oz/week     Comment: wine 1x/mo   Interim medical history since last visit reviewed. Allergies and medications reviewed  Review of Systems Per HPI unless specifically indicated above     Objective:    BP 140/82   Pulse 98   Temp 97.7 F (36.5 C) (Oral)   Resp 16   Wt 234 lb 9.6 oz (106.4 kg)   SpO2 98%   BMI 36.74 kg/m   Wt Readings from Last 3 Encounters:  09/27/16 234 lb 9.6 oz (106.4 kg)  09/26/16 230 lb (104.3 kg)  09/26/16  230 lb (104.3 kg)    Physical Exam  Constitutional: He appears well-developed and well-nourished. He appears distressed (patient appears to be in obvious discomfort; gets up and stands a few times during the visit; somewhat restless).  obese  HENT:  Head: Normocephalic and atraumatic.  Mouth/Throat: Oropharynx is clear and moist.  Eyes: No scleral icterus.  Neck: No JVD present.  Cardiovascular: Normal rate and regular rhythm.   Pulmonary/Chest: Effort normal and breath sounds normal. He has no decreased breath sounds.  Abdominal: Soft. Bowel sounds are normal. He exhibits no distension. There is tenderness (left lower abdomen and left flank). There is no guarding.  obese  Musculoskeletal:  He exhibits no edema.  Neurological: He is alert.  Skin: He is not diaphoretic. No pallor.  Psychiatric: He has a normal mood and affect. His mood appears not anxious. He does not exhibit a depressed mood.   Results for orders placed or performed in visit on 09/27/16  POCT urinalysis dipstick  Result Value Ref Range   Color, UA yellow    Clarity, UA clear    Glucose, UA neg    Bilirubin, UA neg    Ketones, UA neg    Spec Grav, UA 1.020    Blood, UA 3+    pH, UA 5.5    Protein, UA neg    Urobilinogen, UA 0.2    Nitrite, UA neg    Leukocytes, UA Negative Negative      Assessment & Plan:   Problem List Items Addressed This Visit      Cardiovascular and Mediastinum   Hypertension    Urge DASH guideline; avoid decongestants        Respiratory   Moderate persistent asthma    Followed by pulmonologist        Other   Prolonged Q-T interval on ECG    I noticed prolonged QTc interval on last two EKGs; advised patient to stop the ondansetron and we'll use promethazine only if needed; we tried to contact his cardiologist, but could not get through today      Obesity    Work on weight loss      Relevant Orders   Lipid panel   Medication monitoring encounter    Check labs       Relevant Orders   Comprehensive Metabolic Panel (CMET)   Hyperlipidemia    Check lipids; goal LDL less less than 70      Relevant Orders   Lipid panel   History of acute myocardial infarction    I explained to patient that he should most likely be taking an 81 mg aspirin daily; he will discuss aspirin with Dr. Fletcher Anon and get in to see him soon; weight loss key; check lipids      Relevant Orders   Lipid panel    Other Visit Diagnoses    Kidney stone    -  Primary   1 cm stone on the left; I talked with Dr. McDiarmid, reviewed CT, labs; pt to ER if worse (pain, fever); check labs, urine; pain med Rxd; appt tomorrow w/uro   Relevant Medications   HYDROcodone-acetaminophen (NORCO) 5-325 MG tablet   promethazine (PHENERGAN) 25 MG tablet   Other Relevant Orders   POCT urinalysis dipstick (Completed)   Other elevated white blood cell (WBC) count       check CBC today   Relevant Orders   CBC with Differential/Platelet   Abnormal finding on radiology exam       chest CT may be warranted per radiology report from scan done yesterday in ER, noted, will order   Relevant Orders   CT Chest Wo Contrast   Hydronephrosis due to obstruction of ureter       causing pain; pt reports pain level better than yesterday; talked to urologist; if worsening pain, then to ER; pain med Rxd w/my typical speech after NCCSRS rev   Ketonuria       perhaps related to poor PO intake, dehydration; will check labs including glucose and CO2 level (risk of diabetes with his obesity)      Follow up plan: No Follow-up on file.  An after-visit summary was  printed and given to the patient at [place].  Please see the patient instructions which may contain other information and recommendations beyond what is mentioned above in the assessment and plan.  Meds ordered this encounter  Medications  . HYDROcodone-acetaminophen (NORCO) 5-325 MG tablet    Sig: Take 1 tablet by mouth every 4 (four) hours as needed for  moderate pain.    Dispense:  12 tablet    Refill:  0  . promethazine (PHENERGAN) 25 MG tablet    Sig: Take 1 tablet (25 mg total) by mouth every 6 (six) hours as needed for nausea or vomiting.    Dispense:  30 tablet    Refill:  0   Orders Placed This Encounter  Procedures  . CT Chest Wo Contrast  . Lipid panel  . CBC with Differential/Platelet  . Comprehensive Metabolic Panel (CMET)  . POCT urinalysis dipstick   [phone] Home number ----------------------------- Labs reviewed, I personally spoke with urologist on-call about creatinine, case; he'll see pt tomorrow in office, don't eat or drink before appt in case procedure needed I called, discussed findings of today's labs and CT scan abnormality from yesterday requiring CT scan of chest; reasons to go to ER reviewed (pain, fever) Phone call longer than 15 minutes discussing findings, plan, chest CT, kidney issues, lifestyle changes, etc.

## 2016-09-28 ENCOUNTER — Ambulatory Visit: Payer: Federal, State, Local not specified - PPO | Admitting: Urology

## 2016-09-28 ENCOUNTER — Encounter: Payer: Self-pay | Admitting: Urology

## 2016-09-28 VITALS — BP 142/85 | HR 79 | Ht 67.0 in | Wt 235.7 lb

## 2016-09-28 DIAGNOSIS — N138 Other obstructive and reflux uropathy: Secondary | ICD-10-CM | POA: Diagnosis not present

## 2016-09-28 DIAGNOSIS — N281 Cyst of kidney, acquired: Secondary | ICD-10-CM

## 2016-09-28 DIAGNOSIS — N433 Hydrocele, unspecified: Secondary | ICD-10-CM

## 2016-09-28 DIAGNOSIS — N200 Calculus of kidney: Secondary | ICD-10-CM

## 2016-09-28 DIAGNOSIS — N401 Enlarged prostate with lower urinary tract symptoms: Secondary | ICD-10-CM

## 2016-09-28 HISTORY — DX: Calculus of kidney: N20.0

## 2016-09-28 LAB — MICROSCOPIC EXAMINATION: BACTERIA UA: NONE SEEN

## 2016-09-28 LAB — URINALYSIS, COMPLETE
Bilirubin, UA: NEGATIVE
GLUCOSE, UA: NEGATIVE
Ketones, UA: NEGATIVE
LEUKOCYTES UA: NEGATIVE
Nitrite, UA: NEGATIVE
PH UA: 5.5 (ref 5.0–7.5)
PROTEIN UA: NEGATIVE
Specific Gravity, UA: <1.005 — ABNORMAL LOW (ref 1.005–1.030)
UUROB: 0.2 mg/dL (ref 0.2–1.0)

## 2016-09-28 MED ORDER — OXYCODONE-ACETAMINOPHEN 5-325 MG PO TABS: 1.0000 | ORAL_TABLET | Freq: Four times a day (QID) | ORAL | 0 refills | Status: DC | PRN

## 2016-09-28 NOTE — Progress Notes (Signed)
 09/28/2016 7:14 AM   Willie Crane 11/21/1945 [id number]  Referring provider: Melinda P Lada, Crane [address] [place], [place] [address]  CC - new patient, nephrolithiasis  HPI:  1. Nephrolithiasis - 10mm left prox stone with mod hydro by ER CT 09/2016. Some Cr bump to 1.8 (baseline <1.2). UA without infectious parameters. Stone is solitary, 10mm, 920HU, SSD 18cm.  2. BPH with obstruction/lower urinary tract symptoms - on tamsulosin + finasteride at baseline for mild bother from obstructive symptoms. Prostate vol 53mL by CT ellipsoid calculation 2018.   3. Hydrocele, bilateral - non-complex bilateral hydroceles on exam and imaging x several. No bother.   4. Bilateral Simple Renal Cysts - Rt 18cm mid, 4cm lower and Left 4cm non-complex renal cysts. Contrast CT 2017 w/o enhancing nodules, coarse calcifications, mass effect. Stable on imaging x several  5 - Prostate Screening -   09/2016 PSA 1.3 (PCP labs, on finasteride)  PMH sig for obesity, lap chole, ortho surgery. His PCP is Willie Lada Crane  Today "Willie Crane" is seen as new patient for above.    PMH: Past Medical History:  Diagnosis Date  . Anxiety   . Asthma   . Benign paroxysmal positional vertigo   . BPH (benign prostatic hypertrophy)   . Complication of anesthesia   . COPD (chronic obstructive pulmonary disease) ([facility])   . Coronary artery disease    history of heart attack  . Degeneration of lumbar or lumbosacral intervertebral disc   . History of kidney stones   . Hyperlipidemia   . Hypertension   . MI (myocardial infarction)    1980s  . Obesity, unspecified   . PONV (postoperative nausea and vomiting)   . PTSD (post-traumatic stress disorder)   . Thoracic or lumbosacral neuritis or radiculitis, unspecified   . Unspecified congenital anomaly of the integument   . Unspecified gastritis and gastroduodenitis without mention of hemorrhage     Surgical History: Past Surgical History:  Procedure  Laterality Date  . BACK SURGERY    . CARDIAC CATHETERIZATION  03/2009   [facility]: No significant coronary artery disease  . [facility]  . CARPAL TUNNEL RELEASE Right   . CHOLECYSTECTOMY    . COLONOSCOPY    . COLONOSCOPY WITH PROPOFOL N/A 10/31/2015   Procedure: COLONOSCOPY WITH PROPOFOL;  Surgeon: Willie Wohl, Crane;  Location: [facility];  Service: Endoscopy;  Laterality: N/A;  . CYST EXCISION    . HERNIA REPAIR    . INGUINAL HERNIA REPAIR Right   . INGUINAL HERNIA REPAIR Left 01/26/2016   Procedure: HERNIA REPAIR INGUINAL ADULT;  Surgeon: Willie G Sankar, Crane;  Location: [facility];  Service: General;  Laterality: Left;  . KNEE ARTHROSCOPY    . LUMBAR LAMINECTOMY    . POLYPECTOMY  10/31/2015   Procedure: POLYPECTOMY;  Surgeon: Willie Wohl, Crane;  Location: [facility];  Service: Endoscopy;;  . VASECTOMY      Home Medications:  Allergies as of 09/28/2016      Reactions   Other Swelling   Lips swell - Potatoes, Kiwi, carrots, celery (raw)        Medication List       Accurate as of 09/28/16  7:14 AM. Always use your most recent med list.          albuterol 108 (90 Base) MCG/ACT inhaler Commonly known as:  PROVENTIL HFA Inhale 2 puffs into the lungs every 4 (four) hours as needed for wheezing   or shortness of breath.   ascorbic acid 250 MG Chew Commonly known as:  VITAMIN C Chew 500 mg by mouth daily.   budesonide-formoterol 160-4.5 MCG/ACT inhaler Commonly known as:  SYMBICORT Inhale 2 puffs into the lungs 2 (two) times daily.   finasteride 5 MG tablet Commonly known as:  PROSCAR Take 1 tablet (5 mg total) by mouth daily.   FLUoxetine 20 MG capsule Commonly known as:  PROZAC TAKE 1 CAPSULE (20 MG TOTAL) BY MOUTH DAILY.   HYDROcodone-acetaminophen 5-325 MG tablet Commonly known as:  NORCO Take 1 tablet by mouth every 4 (four) hours as needed for moderate pain.   MULTI ADULT GUMMIES Chew Chew 1 tablet by mouth daily.     olmesartan 20 MG tablet Commonly known as:  BENICAR Take 1 tablet (20 mg total) by mouth daily.   promethazine 25 MG tablet Commonly known as:  PHENERGAN Take 1 tablet (25 mg total) by mouth every 6 (six) hours as needed for nausea or vomiting.   tamsulosin 0.4 MG Caps capsule Commonly known as:  FLOMAX Take 1 capsule (0.4 mg total) by mouth daily after supper.       Allergies:  Allergies  Allergen Reactions  . Other Swelling    Lips swell - Potatoes, Kiwi, carrots, celery (raw)      Family History: Family History  Problem Relation Age of Onset  . Kidney failure Mother   . Cancer Mother   . Cancer Father     lung  . Prostate cancer Neg Hx     Social History:  reports that he quit smoking about 37 years ago. His smoking use included Cigarettes. He has a 20.00 pack-year smoking history. He has quit using smokeless tobacco. His smokeless tobacco use included Chew. He reports that he drinks alcohol. He reports that he does not use drugs.   Review of Systems  Gastrointestinal (upper)  : Negative for upper GI symptoms  Gastrointestinal (lower) : Negative for lower GI symptoms  Constitutional : Negative for symptoms  Skin: Negative for skin symptoms  Eyes: Negative for eye symptoms  Ear/Nose/Throat : Negative for Ear/Nose/Throat symptoms  Hematologic/Lymphatic: Negative for Hematologic/Lymphatic symptoms  Cardiovascular : Negative for cardiovascular symptoms  Respiratory : Negative for respiratory symptoms  Endocrine: Negative for endocrine symptoms  Musculoskeletal: Negative for musculoskeletal symptoms  Neurological: Negative for neurological symptoms  Psychologic: Negative for psychiatric symptoms  Physical Exam: There were no vitals taken for this visit.  Constitutional:  Alert and oriented, No acute distress. HEENT: [place] AT, moist mucus membranes.  Trachea midline, no masses. Cardiovascular: No clubbing, cyanosis, or edema. Respiratory:  Normal respiratory effort, no increased work of breathing. GI: Abdomen is soft, nontender, nondistended, no abdominal masses GU: Moderate LEFT CVAT. Phallus straight. Bilateral soft hydroceles.  Skin: No rashes, bruises or suspicious lesions. Lymph: No cervical or inguinal adenopathy. Neurologic: Grossly intact, no focal deficits, moving all 4 extremities. Psychiatric: Normal mood and affect.  Laboratory Data: Lab Results  Component Value Date   WBC 10.7 (H) 09/27/2016   HGB 14.4 09/27/2016   HCT 42.3 09/27/2016   MCV 88.5 09/27/2016   PLT 193 09/27/2016    Lab Results  Component Value Date   CREATININE 1.87 (H) 09/27/2016    No results found for: PSA  No results found for: TESTOSTERONE  No results found for: HGBA1C  Urinalysis    Component Value Date/Time   COLORURINE YELLOW (A) 09/26/2016 1919   APPEARANCEUR CLEAR (A) 09/26/2016 1919   APPEARANCEUR Clear   01/04/2016 1152   LABSPEC 1.020 09/26/2016 1919   PHURINE 5.0 09/26/2016 1919   GLUCOSEU NEGATIVE 09/26/2016 1919   HGBUR NEGATIVE 09/26/2016 1919   BILIRUBINUR neg 09/27/2016 1538   BILIRUBINUR Negative 01/04/2016 1152   KETONESUR 20 (A) 09/26/2016 1919   PROTEINUR neg 09/27/2016 1538   PROTEINUR NEGATIVE 09/26/2016 1919   UROBILINOGEN 0.2 09/27/2016 1538   NITRITE neg 09/27/2016 1538   NITRITE NEGATIVE 09/26/2016 1919   LEUKOCYTESUR Negative 09/27/2016 1538   LEUKOCYTESUR Negative 01/04/2016 1152    Pertinent Imaging: as per HPI  Assessment & Plan:    1. Nephrolithiasis - large stone with refracotry sympotms and now some GFR decline. Discussed options of continued medical therapy (very unlikely to pass), shockwave lithotripsy (his skin to stone distance and stone size do not predict good response), or ureteroscopy (highest liklihood of success). He opts for ureteroscopy today and I agree. Risks, benefits, expected peri-op course, need for likely peri-op temporary ureteral stent, need for possible staged  approach discussed.   2. BPH with obstruction/lower urinary tract symptoms - continued tamsulosin + finasteride, would need outlet procedure of progresses.   3. Hydrocele, bilateral -non-bothersome, observe.   4. Bilateral Simple Renal Cysts - non-bothersome, observe.   5 - Prostate Screening - up to date on screenign. Would refrain from further PSA based screengin as >70 and average risk.      No Follow-up on file.  Kadyn Chovan, Crane  [facility] [address], [address] [place], [place] [address] [phone]   

## 2016-10-01 ENCOUNTER — Other Ambulatory Visit: Payer: Self-pay | Admitting: Radiology

## 2016-10-01 ENCOUNTER — Telehealth: Payer: Self-pay

## 2016-10-01 ENCOUNTER — Telehealth: Payer: Self-pay | Admitting: Radiology

## 2016-10-01 ENCOUNTER — Other Ambulatory Visit: Payer: Self-pay

## 2016-10-01 DIAGNOSIS — R110 Nausea: Secondary | ICD-10-CM

## 2016-10-01 DIAGNOSIS — N201 Calculus of ureter: Secondary | ICD-10-CM

## 2016-10-01 MED ORDER — ONDANSETRON HCL 4 MG PO TABS: 4.0000 mg | ORAL_TABLET | Freq: Three times a day (TID) | ORAL | 0 refills | Status: DC | PRN

## 2016-10-01 MED ORDER — ONDANSETRON HCL 4 MG PO TABS
4.0000 mg | ORAL_TABLET | Freq: Three times a day (TID) | ORAL | 0 refills | Status: DC | PRN
Start: 2016-10-01 — End: 2016-10-05

## 2016-10-01 NOTE — Telephone Encounter (Signed)
Notified pt & wife of surgery scheduled with Dr Pilar Jarvis on 10/05/16, pre-admit testing appt on 10/02/16 @10 :30 & to call day prior to surgery for arrival time to SDS. Pt voices understanding.

## 2016-10-01 NOTE — Telephone Encounter (Signed)
Pt wife called stating she received a phone call from [place] about possibly receiving the wrong rx from pharmacy. Reinforced with pt wife would need to take medication back to the pharmacy and ask them. Wife voiced understanding.

## 2016-10-01 NOTE — Telephone Encounter (Signed)
Pt left a message that he needs more nausea medication. Per Dr. Junious Silk pt was given zofran 4mg  q8h #12. Made pt aware. Pt voiced understanding.

## 2016-10-02 ENCOUNTER — Encounter
Admission: RE | Admit: 2016-10-02 | Discharge: 2016-10-02 | Disposition: A | Discharge status: Home / Self Care | Payer: Federal, State, Local not specified - PPO | Source: Ambulatory Visit | Attending: Urology | Admitting: Urology

## 2016-10-02 DIAGNOSIS — R938 Abnormal findings on diagnostic imaging of other specified body structures: Secondary | ICD-10-CM | POA: Diagnosis present

## 2016-10-02 DIAGNOSIS — R918 Other nonspecific abnormal finding of lung field: Secondary | ICD-10-CM | POA: Diagnosis not present

## 2016-10-02 NOTE — Patient Instructions (Signed)
Your procedure is scheduled [id number] 2, 2018 Su procedimiento est programado para: Report to 2nd floor of MEDICAL MALL THRU THE REVOLVING DOOR Presntese a: To find out your arrival time please call [phone] between 1PM - 3PM on October 04, 2016 [place] su hora de llegada por favor llame al ([place]:  Remember: Instructions that are not followed completely may result in serious medical risk, up to and including death, or upon the discretion of your surgeon and anesthesiologist your surgery may need to be rescheduled.  Recuerde: Las instrucciones que no se siguen completamente Heritage manager en un riesgo de salud grave, incluyendo hasta la [place] o a discrecin de su cirujano y Environmental health practitioner, su ciruga se puede posponer.   __X__ 1. Do not eat food or drink liquids after midnight. No gum chewing or hard candies.  No coma alimentos ni tome lquidos despus de la medianoche.  No mastique chicle ni caramelos  duros.     __X__ 2. No alcohol for 24 hours before or after surgery.    No tome alcohol durante las 24 horas antes ni despus de la [place].   __X__ 3. Bring all medications with you on the day of surgery if instructed.BRING ANY NEW MEDICATIONS TO HOSPITAL                              Lleve todos los medicamentos con usted el da de su ciruga si se le ha indicado as.   _X___ 4. Notify your doctor if there is any change in your medical condition (cold, fever,                             infections).    Informe a su mdico si hay algn cambio en su condicin mdica (resfriado, fiebre, infecciones).   Do not wear jewelry, make-up, hairpins, clips or nail polish.  No use joyas, maquillajes, pinzas/ganchos para el cabello ni esmalte de uas.  Do not wear lotions, powders, or perfumes. You may wear deodorant.  No use lociones, polvos o perfumes.  Puede usar desodorante.    Do not shave 48 hours prior to surgery. Men may shave face and  neck.  No se afeite 48 horas antes de la [place].  Los hombres pueden [employer] cara y el cuello.   Do not bring valuables to the hospital.   No lleve objetos [facility] is not responsible for any belongings or valuables.  Jeff no se hace responsable de ningn tipo de pertenencias u objetos de Geographical information systems officer.               Contacts, dentures or bridgework may not be worn into surgery.  Los lentes de [place], las dentaduras postizas o puentes no se pueden usar en la [place].  Leave your suitcase in the car. After surgery it may be brought to your room.  Deje su maleta en el auto.  Despus de la ciruga podr traerla a su habitacin.  For patients admitted to the hospital, discharge time is determined by your treatment team.  Para los pacientes que sean ingresados al hospital, el tiempo en el cual se le dar de alta es determinado por su                equipo de [place].   Patients discharged the day of  surgery will not be allowed to drive home. A los pacientes que se les da de alta el mismo da de la ciruga no se les permitir conducir a Holiday representative.   Please read over the following fact sheets that you were given: Por favor [place] informacin que le dieron:      ___X_ Take these medicines the morning of surgery with A SIP OF WATER:          [employer] medicinas la maana de la ciruga con UN SORBO DE AGUA:  1. ZOFRAN   2. OXYCODONE-ACETAMINOPHEN- IF NEEDED   3. BENICAR  4  PROZAC     5.  6.  ____ Fleet Enema (as directed)          Enema de Fleet (segn lo indicado)    ____ Use CHG Soap as directed          Utilice el jabn de CHG segn lo indicado  __X__ Use inhalers on the day of surgery          Use los inhaladores el da de la ciruga  ____ Stop metformin 2 days prior to surgery          Deje de tomar el metformin 2 das antes de la ciruga    ____ Take 1/2 of usual insulin dose the night before surgery and none on the morning  of surgery           Tome la mitad de la dosis habitual de insulina la noche antes de la [place] y no tome nada en la maana de la             ciruga  ____ Stop Coumadin/Plavix/aspirin on           Deje de tomar el Coumadin/Plavix/aspirina el da:  __X__ Stop Anti-inflammatories on ADVIL, IBUPROFEN, ALEVE, MOTRIN - USE ONLY TYLENOL           Deje de tomar antiinflamatorios el da:   __X__ Stop supplements until after surgery            Deje de tomar suplementos hasta despus de la ciruga  ____ Bring C-Pap to the hospital          [place] al hospital

## 2016-10-03 ENCOUNTER — Ambulatory Visit
Admission: RE | Admit: 2016-10-03 | Discharge: 2016-10-03 | Disposition: A | Discharge status: Home / Self Care | Payer: Federal, State, Local not specified - PPO | Source: Ambulatory Visit | Attending: Family Medicine | Admitting: Family Medicine

## 2016-10-03 DIAGNOSIS — R918 Other nonspecific abnormal finding of lung field: Secondary | ICD-10-CM | POA: Insufficient documentation

## 2016-10-03 DIAGNOSIS — R9389 Abnormal findings on diagnostic imaging of other specified body structures: Secondary | ICD-10-CM

## 2016-10-05 ENCOUNTER — Encounter
Admission: RE | Disposition: A | Discharge status: Home / Self Care | Payer: Self-pay | Source: Ambulatory Visit | Attending: Urology

## 2016-10-05 ENCOUNTER — Ambulatory Visit
Admission: RE | Admit: 2016-10-05 | Discharge: 2016-10-05 | Disposition: A | Discharge status: Home / Self Care | Payer: Federal, State, Local not specified - PPO | Source: Ambulatory Visit | Attending: Urology | Admitting: Urology

## 2016-10-05 ENCOUNTER — Ambulatory Visit: Payer: Federal, State, Local not specified - PPO | Admitting: Certified Registered Nurse Anesthetist

## 2016-10-05 ENCOUNTER — Encounter: Payer: Self-pay | Admitting: *Deleted

## 2016-10-05 DIAGNOSIS — N201 Calculus of ureter: Secondary | ICD-10-CM

## 2016-10-05 DIAGNOSIS — N433 Hydrocele, unspecified: Secondary | ICD-10-CM | POA: Diagnosis not present

## 2016-10-05 DIAGNOSIS — I2510 Atherosclerotic heart disease of native coronary artery without angina pectoris: Secondary | ICD-10-CM | POA: Insufficient documentation

## 2016-10-05 DIAGNOSIS — N200 Calculus of kidney: Secondary | ICD-10-CM

## 2016-10-05 DIAGNOSIS — J45909 Unspecified asthma, uncomplicated: Secondary | ICD-10-CM | POA: Diagnosis not present

## 2016-10-05 DIAGNOSIS — I10 Essential (primary) hypertension: Secondary | ICD-10-CM | POA: Insufficient documentation

## 2016-10-05 DIAGNOSIS — J449 Chronic obstructive pulmonary disease, unspecified: Secondary | ICD-10-CM | POA: Diagnosis not present

## 2016-10-05 DIAGNOSIS — M1990 Unspecified osteoarthritis, unspecified site: Secondary | ICD-10-CM | POA: Insufficient documentation

## 2016-10-05 DIAGNOSIS — Z87891 Personal history of nicotine dependence: Secondary | ICD-10-CM | POA: Insufficient documentation

## 2016-10-05 DIAGNOSIS — F4310 Post-traumatic stress disorder, unspecified: Secondary | ICD-10-CM | POA: Insufficient documentation

## 2016-10-05 DIAGNOSIS — F419 Anxiety disorder, unspecified: Secondary | ICD-10-CM | POA: Insufficient documentation

## 2016-10-05 DIAGNOSIS — Z79899 Other long term (current) drug therapy: Secondary | ICD-10-CM | POA: Insufficient documentation

## 2016-10-05 DIAGNOSIS — N281 Cyst of kidney, acquired: Secondary | ICD-10-CM | POA: Diagnosis not present

## 2016-10-05 DIAGNOSIS — I252 Old myocardial infarction: Secondary | ICD-10-CM | POA: Insufficient documentation

## 2016-10-05 DIAGNOSIS — H8110 Benign paroxysmal vertigo, unspecified ear: Secondary | ICD-10-CM | POA: Diagnosis not present

## 2016-10-05 DIAGNOSIS — R110 Nausea: Secondary | ICD-10-CM

## 2016-10-05 DIAGNOSIS — E669 Obesity, unspecified: Secondary | ICD-10-CM | POA: Diagnosis not present

## 2016-10-05 DIAGNOSIS — N138 Other obstructive and reflux uropathy: Secondary | ICD-10-CM | POA: Diagnosis not present

## 2016-10-05 DIAGNOSIS — N401 Enlarged prostate with lower urinary tract symptoms: Secondary | ICD-10-CM | POA: Insufficient documentation

## 2016-10-05 DIAGNOSIS — E785 Hyperlipidemia, unspecified: Secondary | ICD-10-CM | POA: Insufficient documentation

## 2016-10-05 HISTORY — PX: CYSTOSCOPY WITH STENT PLACEMENT: SHX5790

## 2016-10-05 HISTORY — PX: URETEROSCOPY: SHX842

## 2016-10-05 SURGERY — URETEROSCOPY
Anesthesia: General | Site: Ureter | Laterality: Left | Wound class: Clean Contaminated

## 2016-10-05 MED ORDER — OXYCODONE-ACETAMINOPHEN 5-325 MG PO TABS: 1.0000 | ORAL_TABLET | Freq: Four times a day (QID) | ORAL | 0 refills | Status: DC | PRN

## 2016-10-05 MED ORDER — ONDANSETRON HCL 4 MG PO TABS
4.0000 mg | ORAL_TABLET | Freq: Three times a day (TID) | ORAL | 0 refills | Status: DC | PRN
Start: 2016-10-05 — End: 2016-11-07

## 2016-10-05 MED ORDER — FENTANYL CITRATE (PF) 100 MCG/2ML IJ SOLN
INTRAMUSCULAR | Status: AC
  Filled 2016-10-05: qty 2

## 2016-10-05 MED ORDER — PROPOFOL 10 MG/ML IV BOLUS
INTRAVENOUS | Status: DC | PRN
  Administered 2016-10-05: 50 mg via INTRAVENOUS
  Administered 2016-10-05: 20 mg via INTRAVENOUS
  Administered 2016-10-05: 180 mg via INTRAVENOUS

## 2016-10-05 MED ORDER — ACETAMINOPHEN 10 MG/ML IV SOLN
INTRAVENOUS | Status: AC
  Filled 2016-10-05: qty 100

## 2016-10-05 MED ORDER — DEXTROSE 5 % IV SOLN
2.0000 g | INTRAVENOUS | Status: AC
  Administered 2016-10-05: 2 g via INTRAVENOUS
  Filled 2016-10-05: qty 2

## 2016-10-05 MED ORDER — IOTHALAMATE MEGLUMINE 43 % IV SOLN
INTRAVENOUS | Status: DC | PRN
  Administered 2016-10-05: 50 mL

## 2016-10-05 MED ORDER — LIDOCAINE HCL (CARDIAC) 20 MG/ML IV SOLN
INTRAVENOUS | Status: DC | PRN
  Administered 2016-10-05: 80 mg via INTRAVENOUS

## 2016-10-05 MED ORDER — FENTANYL CITRATE (PF) 100 MCG/2ML IJ SOLN: 25.0000 ug | INTRAMUSCULAR | Status: DC | PRN

## 2016-10-05 MED ORDER — PROPOFOL 10 MG/ML IV BOLUS
INTRAVENOUS | Status: AC
  Filled 2016-10-05: qty 20

## 2016-10-05 MED ORDER — CEPHALEXIN 500 MG PO CAPS: 500.0000 mg | ORAL_CAPSULE | Freq: Three times a day (TID) | ORAL | 0 refills | Status: DC

## 2016-10-05 MED ORDER — MIDAZOLAM HCL 2 MG/2ML IJ SOLN
INTRAMUSCULAR | Status: DC | PRN
Start: 2016-10-05 — End: 2016-10-05
  Administered 2016-10-05: 2 mg via INTRAVENOUS

## 2016-10-05 MED ORDER — ACETAMINOPHEN 10 MG/ML IV SOLN
INTRAVENOUS | Status: DC | PRN
  Administered 2016-10-05: 1000 mg via INTRAVENOUS

## 2016-10-05 MED ORDER — ONDANSETRON HCL 4 MG/2ML IJ SOLN
INTRAMUSCULAR | Status: DC | PRN
  Administered 2016-10-05: 4 mg via INTRAVENOUS

## 2016-10-05 MED ORDER — EPHEDRINE SULFATE 50 MG/ML IJ SOLN
INTRAMUSCULAR | Status: DC | PRN
  Administered 2016-10-05: 5 mg via INTRAVENOUS

## 2016-10-05 MED ORDER — FAMOTIDINE 20 MG PO TABS
ORAL_TABLET | ORAL | Status: AC
  Administered 2016-10-05: 20 mg via ORAL
  Filled 2016-10-05: qty 1

## 2016-10-05 MED ORDER — GLYCOPYRROLATE 0.2 MG/ML IJ SOLN
INTRAMUSCULAR | Status: DC | PRN
  Administered 2016-10-05: 0.2 mg via INTRAVENOUS

## 2016-10-05 MED ORDER — DEXAMETHASONE SODIUM PHOSPHATE 10 MG/ML IJ SOLN
INTRAMUSCULAR | Status: DC | PRN
  Administered 2016-10-05: 10 mg via INTRAVENOUS

## 2016-10-05 MED ORDER — SODIUM CHLORIDE 0.9 % IV SOLN
INTRAVENOUS | Status: DC
  Administered 2016-10-05: 08:00:00 via INTRAVENOUS

## 2016-10-05 MED ORDER — SCOPOLAMINE 1 MG/3DAYS TD PT72
MEDICATED_PATCH | TRANSDERMAL | Status: AC
  Administered 2016-10-05: 1.5 mg via TRANSDERMAL
  Filled 2016-10-05: qty 1

## 2016-10-05 MED ORDER — LIDOCAINE HCL (PF) 2 % IJ SOLN
INTRAMUSCULAR | Status: AC
  Filled 2016-10-05: qty 2

## 2016-10-05 MED ORDER — FAMOTIDINE 20 MG PO TABS
20.0000 mg | ORAL_TABLET | Freq: Once | ORAL | Status: AC
  Administered 2016-10-05: 20 mg via ORAL

## 2016-10-05 MED ORDER — MIDAZOLAM HCL 2 MG/2ML IJ SOLN
INTRAMUSCULAR | Status: AC
  Filled 2016-10-05: qty 2

## 2016-10-05 MED ORDER — ONDANSETRON HCL 4 MG/2ML IJ SOLN: 4.0000 mg | Freq: Once | INTRAMUSCULAR | Status: DC | PRN

## 2016-10-05 MED ORDER — SCOPOLAMINE 1 MG/3DAYS TD PT72
1.0000 | MEDICATED_PATCH | Freq: Once | TRANSDERMAL | Status: DC
  Administered 2016-10-05: 1.5 mg via TRANSDERMAL

## 2016-10-05 MED ORDER — FENTANYL CITRATE (PF) 100 MCG/2ML IJ SOLN
INTRAMUSCULAR | Status: DC | PRN
  Administered 2016-10-05 (×2): 50 ug via INTRAVENOUS

## 2016-10-05 MED ORDER — ONDANSETRON HCL 4 MG/2ML IJ SOLN
INTRAMUSCULAR | Status: AC
  Filled 2016-10-05: qty 2

## 2016-10-05 MED ORDER — PHENYLEPHRINE HCL 10 MG/ML IJ SOLN
INTRAMUSCULAR | Status: DC | PRN
  Administered 2016-10-05 (×3): 100 ug via INTRAVENOUS

## 2016-10-05 SURGICAL SUPPLY — 27 items
BACTOSHIELD CHG 4% 4OZ (MISCELLANEOUS) ×2
BASKET ZERO TIP 1.9FR (BASKET) IMPLANT
CATH URETL 5X70 OPEN END (CATHETERS) ×4 IMPLANT
CNTNR SPEC 2.5X3XGRAD LEK (MISCELLANEOUS) ×2
CONT SPEC 4OZ STER OR WHT (MISCELLANEOUS) ×2
CONTAINER SPEC 2.5X3XGRAD LEK (MISCELLANEOUS) ×2 IMPLANT
GLOVE BIO SURGEON STRL SZ7 (GLOVE) ×4 IMPLANT
GLOVE BIO SURGEON STRL SZ7.5 (GLOVE) ×4 IMPLANT
GOWN STRL REUS W/ TWL LRG LVL4 (GOWN DISPOSABLE) ×2 IMPLANT
GOWN STRL REUS W/TWL LRG LVL4 (GOWN DISPOSABLE) ×2
GOWN STRL REUS W/TWL XL LVL3 (GOWN DISPOSABLE) ×4 IMPLANT
GUIDEWIRE SUPER STIFF (WIRE) IMPLANT
INTRODUCER DILATOR DOUBLE (INTRODUCER) IMPLANT
KIT RM TURNOVER CYSTO AR (KITS) ×4 IMPLANT
PACK CYSTO AR (MISCELLANEOUS) ×4 IMPLANT
SCRUB CHG 4% DYNA-HEX 4OZ (MISCELLANEOUS) ×2 IMPLANT
SENSORWIRE 0.038 NOT ANGLED (WIRE) ×8
SET CYSTO W/LG BORE CLAMP LF (SET/KITS/TRAYS/PACK) ×4 IMPLANT
SHEATH URETERAL 13/15X36 1L (SHEATH) IMPLANT
SOL .9 NS 3000ML IRR  AL (IV SOLUTION) ×2
SOL .9 NS 3000ML IRR UROMATIC (IV SOLUTION) ×2 IMPLANT
STENT URET 6FRX24 CONTOUR (STENTS) IMPLANT
STENT URET 6FRX26 CONTOUR (STENTS) ×4 IMPLANT
SURGILUBE 2OZ TUBE FLIPTOP (MISCELLANEOUS) ×4 IMPLANT
SYRINGE IRR TOOMEY STRL 70CC (SYRINGE) ×4 IMPLANT
WATER STERILE IRR 1000ML POUR (IV SOLUTION) ×4 IMPLANT
WIRE SENSOR 0.038 NOT ANGLED (WIRE) ×4 IMPLANT

## 2016-10-05 NOTE — Anesthesia Preprocedure Evaluation (Signed)
Anesthesia Evaluation  Patient identified by MRN, date of birth, ID band Patient awake    Reviewed: Allergy & Precautions, NPO status , Patient's Chart, lab work & pertinent test results, reviewed documented beta blocker date and time   History of Anesthesia Complications (+) PONV  Airway Mallampati: III  TM Distance: >3 FB     Dental  (+) Chipped   Pulmonary asthma , COPD, former smoker,           Cardiovascular hypertension, Pt. on medications + CAD and + Past MI       Neuro/Psych PSYCHIATRIC DISORDERS Anxiety    GI/Hepatic   Endo/Other    Renal/GU Renal InsufficiencyRenal disease     Musculoskeletal  (+) Arthritis ,   Abdominal   Peds  Hematology   Anesthesia Other Findings   Reproductive/Obstetrics                             Anesthesia Physical Anesthesia Plan  ASA: III  Anesthesia Plan: General   Post-op Pain Management:    Induction: Intravenous  Airway Management Planned: LMA  Additional Equipment:   Intra-op Plan:   Post-operative Plan:   Informed Consent: I have reviewed the patients History and Physical, chart, labs and discussed the procedure including the risks, benefits and alternatives for the proposed anesthesia with the patient or authorized representative who has indicated his/her understanding and acceptance.     Plan Discussed with: CRNA  Anesthesia Plan Comments:         Anesthesia Quick Evaluation

## 2016-10-05 NOTE — Op Note (Signed)
Date of procedure: 10/05/16  Preoperative diagnosis:  1. Left ureteral stone   Postoperative diagnosis:  1. Left ureteral stone   Procedure: 1. Cystoscopy 2. Attempted left ureteroscopy 3. Left retrograde pyelogram with interpretation 4. Left ureteral stent placement 6 French by 26 cm  Surgeon: Baruch Gouty, MD  Anesthesia: General  Complications: None  Intraoperative findings: Much difficulty, was able to navigate into the distal left ureter however further advancement of the ureteroscope wasn't possible due to the narrowness of the patient's urethra. Ureteroscopy was aborted at this point in order not to cause significant ureteral damage.  EBL: None  Specimens: None  Drains: Left 6 French by 26 cm double-J ureteral stent  Disposition: Stable to the postanesthesia care unit  Indication for procedure: The patient is a 71 y.o. male with a 1 cm left ureteral stone has failed medical expulsive therapy presents today for definitive management.  After reviewing the management options for treatment, the patient elected to proceed with the above surgical procedure(s). We have discussed the potential benefits and risks of the procedure, side effects of the proposed treatment, the likelihood of the patient achieving the goals of the procedure, and any potential problems that might occur during the procedure or recuperation. Informed consent has been obtained.  Description of procedure: The patient was met in the preoperative area. All risks, benefits, and indications of the procedure were described in great detail. The patient consented to the procedure. Preoperative antibiotics were given. The patient was taken to the operative theater. General anesthesia was induced per the anesthesia service. The patient was then placed in the dorsal lithotomy position and prepped and draped in the usual sterile fashion. A preoperative timeout was called.   A 21 French 30 cystoscope was inserted into  the patient's bladder per urethra atraumatically. A sensor wire was advanced in the left ureteral orifice up to the level of the left renal pelvis under fluoroscopy. A single channel semirigid ureter scope was inserted the patient's bladder per urethra. Attempt was made to enter the left ear orifice but this was not possible. With a second sensor wire placed through the ureteroscope and advanced into the left ear orifice unable to enter the left distal ureter with some difficulty. However plan entering the left distal ureter, it was very narrow and I was unable to safely advance the ureteroscope any further without risking significant ureteral damage. At this point decision was made to abort the procedure and the stent. A left retrograde polygrams obtained which showed an apparent filling defect in the proximal to mid ureter as well as hydronephrosis proximal to to this with dilation of all 3 calyces. There is was then completely withdrawn. The cystoscope was then assembled over the sensor wire and a 6 [place] by 26 cm double-J ureteral stent was placed and the sensor wire was removed. A curl was seen in the patient's urinary bladder direct visualization and in the renal pelvis on the left with fluoroscopy. There is drainage of clear urine at this point. This point the patient's bladder was drained and he was woken from anesthesia and transferred in stable condition to the post anesthesia care unit.  Plan: The patient will follow-up in approximately 2 weeks after having undergone passive dilation of his left ureter for reattempt at left ureteroscopy.  Baruch Gouty, M.D.

## 2016-10-05 NOTE — H&P (View-Only) (Signed)
 09/28/2016 7:14 AM   Willie Crane 03/21/1946 [id number]  Referring provider: Melinda P Lada, MD [address] [place], [place] [address]  CC - new patient, nephrolithiasis  HPI:  1. Nephrolithiasis - 10mm left prox stone with mod hydro by ER CT 09/2016. Some Cr bump to 1.8 (baseline <1.2). UA without infectious parameters. Stone is solitary, 10mm, 920HU, SSD 18cm.  2. BPH with obstruction/lower urinary tract symptoms - on tamsulosin + finasteride at baseline for mild bother from obstructive symptoms. Prostate vol 53mL by CT ellipsoid calculation 2018.   3. Hydrocele, bilateral - non-complex bilateral hydroceles on exam and imaging x several. No bother.   4. Bilateral Simple Renal Cysts - Rt 18cm mid, 4cm lower and Left 4cm non-complex renal cysts. Contrast CT 2017 w/o enhancing nodules, coarse calcifications, mass effect. Stable on imaging x several  5 - Prostate Screening -   09/2016 PSA 1.3 (PCP labs, on finasteride)  PMH sig for obesity, lap chole, ortho surgery. His PCP is Melinda Lada MD  Today "Willie Crane" is seen as new patient for above.    PMH: Past Medical History:  Diagnosis Date  . Anxiety   . Asthma   . Benign paroxysmal positional vertigo   . BPH (benign prostatic hypertrophy)   . Complication of anesthesia   . COPD (chronic obstructive pulmonary disease) (HCC)   . Coronary artery disease    history of heart attack  . Degeneration of lumbar or lumbosacral intervertebral disc   . History of kidney stones   . Hyperlipidemia   . Hypertension   . MI (myocardial infarction)    1980s  . Obesity, unspecified   . PONV (postoperative nausea and vomiting)   . PTSD (post-traumatic stress disorder)   . Thoracic or lumbosacral neuritis or radiculitis, unspecified   . Unspecified congenital anomaly of the integument   . Unspecified gastritis and gastroduodenitis without mention of hemorrhage     Surgical History: Past Surgical History:  Procedure  Laterality Date  . BACK SURGERY    . CARDIAC CATHETERIZATION  03/2009   [facility]: No significant coronary artery disease  . CARDIAC CATHETERIZATION  01/2014   [facility]  . CARPAL TUNNEL RELEASE Right   . CHOLECYSTECTOMY    . COLONOSCOPY    . COLONOSCOPY WITH PROPOFOL N/A 10/31/2015   Procedure: COLONOSCOPY WITH PROPOFOL;  Surgeon: Darren Wohl, MD;  Location: [facility];  Service: Endoscopy;  Laterality: N/A;  . CYST EXCISION    . HERNIA REPAIR    . INGUINAL HERNIA REPAIR Right   . INGUINAL HERNIA REPAIR Left 01/26/2016   Procedure: HERNIA REPAIR INGUINAL ADULT;  Surgeon: Seeplaputhur G Sankar, MD;  Location: [facility];  Service: General;  Laterality: Left;  . KNEE ARTHROSCOPY    . LUMBAR LAMINECTOMY    . POLYPECTOMY  10/31/2015   Procedure: POLYPECTOMY;  Surgeon: Darren Wohl, MD;  Location: [facility];  Service: Endoscopy;;  . VASECTOMY      Home Medications:  Allergies as of 09/28/2016      Reactions   Other Swelling   Lips swell - Potatoes, Kiwi, carrots, celery (raw)        Medication List       Accurate as of 09/28/16  7:14 AM. Always use your most recent med list.          albuterol 108 (90 Base) MCG/ACT inhaler Commonly known as:  PROVENTIL HFA Inhale 2 puffs into the lungs every 4 (four) hours as needed for wheezing   or shortness of breath.   ascorbic acid 250 MG Chew Commonly known as:  VITAMIN C Chew 500 mg by mouth daily.   budesonide-formoterol 160-4.5 MCG/ACT inhaler Commonly known as:  SYMBICORT Inhale 2 puffs into the lungs 2 (two) times daily.   finasteride 5 MG tablet Commonly known as:  PROSCAR Take 1 tablet (5 mg total) by mouth daily.   FLUoxetine 20 MG capsule Commonly known as:  PROZAC TAKE 1 CAPSULE (20 MG TOTAL) BY MOUTH DAILY.   HYDROcodone-acetaminophen 5-325 MG tablet Commonly known as:  NORCO Take 1 tablet by mouth every 4 (four) hours as needed for moderate pain.   MULTI ADULT GUMMIES Chew Chew 1 tablet by mouth daily.     olmesartan 20 MG tablet Commonly known as:  BENICAR Take 1 tablet (20 mg total) by mouth daily.   promethazine 25 MG tablet Commonly known as:  PHENERGAN Take 1 tablet (25 mg total) by mouth every 6 (six) hours as needed for nausea or vomiting.   tamsulosin 0.4 MG Caps capsule Commonly known as:  FLOMAX Take 1 capsule (0.4 mg total) by mouth daily after supper.       Allergies:  Allergies  Allergen Reactions  . Other Swelling    Lips swell - Potatoes, Kiwi, carrots, celery (raw)      Family History: Family History  Problem Relation Age of Onset  . Kidney failure Mother   . Cancer Mother   . Cancer Father     lung  . Prostate cancer Neg Hx     Social History:  reports that he quit smoking about 37 years ago. His smoking use included Cigarettes. He has a 20.00 pack-year smoking history. He has quit using smokeless tobacco. His smokeless tobacco use included Chew. He reports that he drinks alcohol. He reports that he does not use drugs.   Review of Systems  Gastrointestinal (upper)  : Negative for upper GI symptoms  Gastrointestinal (lower) : Negative for lower GI symptoms  Constitutional : Negative for symptoms  Skin: Negative for skin symptoms  Eyes: Negative for eye symptoms  Ear/Nose/Throat : Negative for Ear/Nose/Throat symptoms  Hematologic/Lymphatic: Negative for Hematologic/Lymphatic symptoms  Cardiovascular : Negative for cardiovascular symptoms  Respiratory : Negative for respiratory symptoms  Endocrine: Negative for endocrine symptoms  Musculoskeletal: Negative for musculoskeletal symptoms  Neurological: Negative for neurological symptoms  Psychologic: Negative for psychiatric symptoms  Physical Exam: There were no vitals taken for this visit.  Constitutional:  Alert and oriented, No acute distress. HEENT: [place] AT, moist mucus membranes.  Trachea midline, no masses. Cardiovascular: No clubbing, cyanosis, or edema. Respiratory:  Normal respiratory effort, no increased work of breathing. GI: Abdomen is soft, nontender, nondistended, no abdominal masses GU: Moderate LEFT CVAT. Phallus straight. Bilateral soft hydroceles.  Skin: No rashes, bruises or suspicious lesions. Lymph: No cervical or inguinal adenopathy. Neurologic: Grossly intact, no focal deficits, moving all 4 extremities. Psychiatric: Normal mood and affect.  Laboratory Data: Lab Results  Component Value Date   WBC 10.7 (H) 09/27/2016   HGB 14.4 09/27/2016   HCT 42.3 09/27/2016   MCV 88.5 09/27/2016   PLT 193 09/27/2016    Lab Results  Component Value Date   CREATININE 1.87 (H) 09/27/2016    No results found for: PSA  No results found for: TESTOSTERONE  No results found for: HGBA1C  Urinalysis    Component Value Date/Time   COLORURINE YELLOW (A) 09/26/2016 1919   APPEARANCEUR CLEAR (A) 09/26/2016 1919   APPEARANCEUR Clear   01/04/2016 1152   LABSPEC 1.020 09/26/2016 1919   PHURINE 5.0 09/26/2016 1919   GLUCOSEU NEGATIVE 09/26/2016 1919   HGBUR NEGATIVE 09/26/2016 1919   BILIRUBINUR neg 09/27/2016 1538   BILIRUBINUR Negative 01/04/2016 1152   KETONESUR 20 (A) 09/26/2016 1919   PROTEINUR neg 09/27/2016 1538   PROTEINUR NEGATIVE 09/26/2016 1919   UROBILINOGEN 0.2 09/27/2016 1538   NITRITE neg 09/27/2016 1538   NITRITE NEGATIVE 09/26/2016 1919   LEUKOCYTESUR Negative 09/27/2016 1538   LEUKOCYTESUR Negative 01/04/2016 1152    Pertinent Imaging: as per HPI  Assessment & Plan:    1. Nephrolithiasis - large stone with refracotry sympotms and now some GFR decline. Discussed options of continued medical therapy (very unlikely to pass), shockwave lithotripsy (his skin to stone distance and stone size do not predict good response), or ureteroscopy (highest liklihood of success). He opts for ureteroscopy today and I agree. Risks, benefits, expected peri-op course, need for likely peri-op temporary ureteral stent, need for possible staged  approach discussed.   2. BPH with obstruction/lower urinary tract symptoms - continued tamsulosin + finasteride, would need outlet procedure of progresses.   3. Hydrocele, bilateral -non-bothersome, observe.   4. Bilateral Simple Renal Cysts - non-bothersome, observe.   5 - Prostate Screening - up to date on screenign. Would refrain from further PSA based screengin as >70 and average risk.      No Follow-up on file.  Reilly Molchan, MD  [facility] [address], [address] [place], [place] [address] [phone]   

## 2016-10-05 NOTE — Anesthesia Post-op Follow-up Note (Cosign Needed)
Anesthesia QCDR form completed.        

## 2016-10-05 NOTE — Anesthesia Procedure Notes (Signed)
Procedure Name: LMA Insertion Date/Time: 10/05/2016 8:52 AM Performed by: Johnna Acosta Pre-anesthesia Checklist: Patient identified, Emergency Drugs available, Suction available and Patient being monitored Patient Re-evaluated:Patient Re-evaluated prior to inductionOxygen Delivery Method: Circle system utilized Preoxygenation: Pre-oxygenation with 100% oxygen Intubation Type: IV induction LMA: LMA inserted LMA Size: 5.0 Tube type: Oral Number of attempts: 1 Placement Confirmation: positive ETCO2 and breath sounds checked- equal and bilateral Tube secured with: Tape Dental Injury: Teeth and Oropharynx as per pre-operative assessment

## 2016-10-05 NOTE — Interval H&P Note (Signed)
History and Physical Interval Note:  10/05/2016 8:36 AM  [name]  has presented today for surgery, with the diagnosis of LEFT URETERAL STONE  The various methods of treatment have been discussed with the patient and family. After consideration of risks, benefits and other options for treatment, the patient has consented to  Procedure(s): URETEROSCOPY WITH HOLMIUM LASER LITHOTRIPSY (Left) CYSTOSCOPY WITH POSSIBLE STENT PLACEMENT (Left) HOLMIUM LASER APPLICATION (Left) as a surgical intervention .  The patient's history has been reviewed, patient examined, no change in status, stable for surgery.  I have reviewed the patient's chart and labs.  Questions were answered to the patient's satisfaction.    RRR Lungs clear  Nickie Retort

## 2016-10-05 NOTE — Transfer of Care (Signed)
Immediate Anesthesia Transfer of Care Note  Patient: Willie Crane  Procedure(s) Performed: Procedure(s): URETEROSCOPY (Left) CYSTOSCOPY WITH STENT PLACEMENT (Left)  Patient Location: PACU  Anesthesia Type:General  Level of Consciousness: sedated  Airway & Oxygen Therapy: Patient Spontanous Breathing and Patient connected to face mask oxygen  Post-op Assessment: Report given to RN and Post -op Vital signs reviewed and stable  Post vital signs: Reviewed and stable  Last Vitals:  Vitals:   10/05/16 0816  BP: 136/86  Pulse: 93  Resp: 18  Temp: 36.5 C    Last Pain:  Vitals:   10/05/16 0816  TempSrc: Oral  PainSc: 2          Complications: No apparent anesthesia complications

## 2016-10-05 NOTE — Anesthesia Postprocedure Evaluation (Signed)
Anesthesia Post Note  Patient: Willie Crane  Procedure(s) Performed: Procedure(s) (LRB): URETEROSCOPY (Left) CYSTOSCOPY WITH STENT PLACEMENT (Left)  Patient location during evaluation: PACU Anesthesia Type: General Level of consciousness: awake and alert Pain management: pain level controlled Vital Signs Assessment: post-procedure vital signs reviewed and stable Respiratory status: spontaneous breathing, nonlabored ventilation, respiratory function stable and patient connected to nasal cannula oxygen Cardiovascular status: blood pressure returned to baseline and stable Postop Assessment: no signs of nausea or vomiting Anesthetic complications: no     Last Vitals:  Vitals:   10/05/16 1013 10/05/16 1031  BP: 128/82 (!) 150/97  Pulse:  89  Resp:  14  Temp: 36.6 C 36.5 C    Last Pain:  Vitals:   10/05/16 1031  TempSrc: Temporal  PainSc: 0-No pain                 Adarian Bur S

## 2016-10-05 NOTE — Discharge Instructions (Signed)

## 2016-10-08 ENCOUNTER — Other Ambulatory Visit: Payer: Self-pay | Admitting: Radiology

## 2016-10-08 ENCOUNTER — Telehealth: Payer: Self-pay | Admitting: Radiology

## 2016-10-08 DIAGNOSIS — N201 Calculus of ureter: Secondary | ICD-10-CM

## 2016-10-08 MED ORDER — TAMSULOSIN HCL 0.4 MG PO CAPS: 0.4000 mg | ORAL_CAPSULE | Freq: Every day | ORAL | 0 refills | Status: DC

## 2016-10-08 NOTE — Telephone Encounter (Signed)
Script for tamsulosin sent to pharmacy per Dr Pilar Jarvis. Pt aware.

## 2016-10-08 NOTE — Telephone Encounter (Signed)
Notified pt of surgery scheduled with Dr Pilar Jarvis on 10/26/16 with repeat ucx on 10/12/16 @2 :00. Pt states he is taking oxycodone every 6 hours but is having no pain. Advised pt to wean off of medication & only take as needed for pain. Pt voices understanding. Pt states he is out of tamsulosin & asks if he needs a refill.Please advise.

## 2016-10-08 NOTE — Telephone Encounter (Signed)
-----   Message from Nickie Retort, MD sent at 10/05/2016  9:33 AM EST ----- I was unable to get to the patient's stone. He will need repeat left URS in 2-3 weeks. Thanks.

## 2016-10-11 ENCOUNTER — Other Ambulatory Visit: Payer: Self-pay

## 2016-10-11 DIAGNOSIS — N200 Calculus of kidney: Secondary | ICD-10-CM

## 2016-10-12 ENCOUNTER — Other Ambulatory Visit: Payer: Federal, State, Local not specified - PPO

## 2016-10-12 ENCOUNTER — Other Ambulatory Visit: Payer: Self-pay | Admitting: Radiology

## 2016-10-12 DIAGNOSIS — N200 Calculus of kidney: Secondary | ICD-10-CM

## 2016-10-12 DIAGNOSIS — N201 Calculus of ureter: Secondary | ICD-10-CM

## 2016-10-17 LAB — CULTURE, URINE COMPREHENSIVE

## 2016-10-26 ENCOUNTER — Encounter
Admission: RE | Disposition: A | Discharge status: Home / Self Care | Payer: Self-pay | Source: Ambulatory Visit | Attending: Urology

## 2016-10-26 ENCOUNTER — Ambulatory Visit: Payer: Federal, State, Local not specified - PPO | Admitting: Anesthesiology

## 2016-10-26 ENCOUNTER — Encounter: Payer: Self-pay | Admitting: *Deleted

## 2016-10-26 ENCOUNTER — Ambulatory Visit
Admission: RE | Admit: 2016-10-26 | Discharge: 2016-10-26 | Disposition: A | Discharge status: Home / Self Care | Payer: Federal, State, Local not specified - PPO | Source: Ambulatory Visit | Attending: Urology | Admitting: Urology

## 2016-10-26 DIAGNOSIS — N200 Calculus of kidney: Secondary | ICD-10-CM

## 2016-10-26 DIAGNOSIS — N138 Other obstructive and reflux uropathy: Secondary | ICD-10-CM | POA: Insufficient documentation

## 2016-10-26 DIAGNOSIS — N281 Cyst of kidney, acquired: Secondary | ICD-10-CM | POA: Insufficient documentation

## 2016-10-26 DIAGNOSIS — Z7951 Long term (current) use of inhaled steroids: Secondary | ICD-10-CM | POA: Diagnosis not present

## 2016-10-26 DIAGNOSIS — I10 Essential (primary) hypertension: Secondary | ICD-10-CM | POA: Diagnosis not present

## 2016-10-26 DIAGNOSIS — Z79899 Other long term (current) drug therapy: Secondary | ICD-10-CM | POA: Diagnosis not present

## 2016-10-26 DIAGNOSIS — E785 Hyperlipidemia, unspecified: Secondary | ICD-10-CM | POA: Insufficient documentation

## 2016-10-26 DIAGNOSIS — N401 Enlarged prostate with lower urinary tract symptoms: Secondary | ICD-10-CM | POA: Insufficient documentation

## 2016-10-26 DIAGNOSIS — J449 Chronic obstructive pulmonary disease, unspecified: Secondary | ICD-10-CM | POA: Insufficient documentation

## 2016-10-26 DIAGNOSIS — N433 Hydrocele, unspecified: Secondary | ICD-10-CM | POA: Insufficient documentation

## 2016-10-26 DIAGNOSIS — I252 Old myocardial infarction: Secondary | ICD-10-CM | POA: Insufficient documentation

## 2016-10-26 DIAGNOSIS — F419 Anxiety disorder, unspecified: Secondary | ICD-10-CM | POA: Insufficient documentation

## 2016-10-26 DIAGNOSIS — Z87891 Personal history of nicotine dependence: Secondary | ICD-10-CM | POA: Diagnosis not present

## 2016-10-26 DIAGNOSIS — N201 Calculus of ureter: Secondary | ICD-10-CM | POA: Diagnosis not present

## 2016-10-26 DIAGNOSIS — I2510 Atherosclerotic heart disease of native coronary artery without angina pectoris: Secondary | ICD-10-CM | POA: Diagnosis not present

## 2016-10-26 HISTORY — PX: CYSTOSCOPY W/ URETERAL STENT PLACEMENT: SHX1429

## 2016-10-26 HISTORY — PX: URETEROSCOPY WITH HOLMIUM LASER LITHOTRIPSY: SHX6645

## 2016-10-26 SURGERY — URETEROSCOPY, WITH LITHOTRIPSY USING HOLMIUM LASER
Anesthesia: General | Site: Ureter | Laterality: Left | Wound class: Clean Contaminated

## 2016-10-26 MED ORDER — PROPOFOL 10 MG/ML IV BOLUS
INTRAVENOUS | Status: DC | PRN
  Administered 2016-10-26: 30 mg via INTRAVENOUS

## 2016-10-26 MED ORDER — OXYCODONE-ACETAMINOPHEN 5-325 MG PO TABS
1.0000 | ORAL_TABLET | Freq: Four times a day (QID) | ORAL | Status: DC | PRN
  Administered 2016-10-26: 1 via ORAL

## 2016-10-26 MED ORDER — FENTANYL CITRATE (PF) 100 MCG/2ML IJ SOLN
INTRAMUSCULAR | Status: DC | PRN
  Administered 2016-10-26: 100 ug via INTRAVENOUS
  Administered 2016-10-26 (×2): 25 ug via INTRAVENOUS

## 2016-10-26 MED ORDER — CEPHALEXIN 500 MG PO CAPS: 500.0000 mg | ORAL_CAPSULE | Freq: Three times a day (TID) | ORAL | 0 refills | Status: DC

## 2016-10-26 MED ORDER — ONDANSETRON HCL 4 MG/2ML IJ SOLN
INTRAMUSCULAR | Status: DC | PRN
  Administered 2016-10-26: 4 mg via INTRAVENOUS

## 2016-10-26 MED ORDER — IPRATROPIUM-ALBUTEROL 0.5-2.5 (3) MG/3ML IN SOLN
RESPIRATORY_TRACT | Status: AC
  Filled 2016-10-26: qty 3

## 2016-10-26 MED ORDER — FENTANYL CITRATE (PF) 100 MCG/2ML IJ SOLN
INTRAMUSCULAR | Status: AC
  Filled 2016-10-26: qty 2

## 2016-10-26 MED ORDER — FAMOTIDINE 20 MG PO TABS
20.0000 mg | ORAL_TABLET | Freq: Once | ORAL | Status: AC
  Administered 2016-10-26: 20 mg via ORAL

## 2016-10-26 MED ORDER — MIDAZOLAM HCL 2 MG/2ML IJ SOLN
INTRAMUSCULAR | Status: DC | PRN
  Administered 2016-10-26: 2 mg via INTRAVENOUS

## 2016-10-26 MED ORDER — DEXAMETHASONE SODIUM PHOSPHATE 10 MG/ML IJ SOLN
INTRAMUSCULAR | Status: AC
  Filled 2016-10-26: qty 1

## 2016-10-26 MED ORDER — MIDAZOLAM HCL 2 MG/2ML IJ SOLN
INTRAMUSCULAR | Status: AC
  Filled 2016-10-26: qty 2

## 2016-10-26 MED ORDER — FENTANYL CITRATE (PF) 100 MCG/2ML IJ SOLN
25.0000 ug | INTRAMUSCULAR | Status: DC | PRN
  Administered 2016-10-26 (×4): 25 ug via INTRAVENOUS

## 2016-10-26 MED ORDER — ONDANSETRON HCL 4 MG/2ML IJ SOLN
INTRAMUSCULAR | Status: AC
  Filled 2016-10-26: qty 2

## 2016-10-26 MED ORDER — OXYCODONE-ACETAMINOPHEN 5-325 MG PO TABS: 1.0000 | ORAL_TABLET | Freq: Four times a day (QID) | ORAL | 0 refills | Status: DC | PRN

## 2016-10-26 MED ORDER — LIDOCAINE HCL (PF) 2 % IJ SOLN
INTRAMUSCULAR | Status: AC
  Filled 2016-10-26: qty 2

## 2016-10-26 MED ORDER — DEXTROSE 5 % IV SOLN
2.0000 g | INTRAVENOUS | Status: AC
  Administered 2016-10-26: 2 g via INTRAVENOUS
  Filled 2016-10-26: qty 2

## 2016-10-26 MED ORDER — DEXAMETHASONE SODIUM PHOSPHATE 10 MG/ML IJ SOLN
INTRAMUSCULAR | Status: DC | PRN
  Administered 2016-10-26: 10 mg via INTRAVENOUS

## 2016-10-26 MED ORDER — PHENYLEPHRINE HCL 10 MG/ML IJ SOLN
INTRAMUSCULAR | Status: DC | PRN
  Administered 2016-10-26 (×2): 100 ug via INTRAVENOUS

## 2016-10-26 MED ORDER — SCOPOLAMINE 1 MG/3DAYS TD PT72
1.0000 | MEDICATED_PATCH | TRANSDERMAL | Status: DC
  Administered 2016-10-26: 1.5 mg via TRANSDERMAL

## 2016-10-26 MED ORDER — EPHEDRINE SULFATE 50 MG/ML IJ SOLN
INTRAMUSCULAR | Status: DC | PRN
  Administered 2016-10-26 (×2): 5 mg via INTRAVENOUS
  Administered 2016-10-26 (×2): 10 mg via INTRAVENOUS

## 2016-10-26 MED ORDER — PROPOFOL 10 MG/ML IV BOLUS
INTRAVENOUS | Status: AC
  Filled 2016-10-26: qty 20

## 2016-10-26 MED ORDER — OXYCODONE-ACETAMINOPHEN 5-325 MG PO TABS
ORAL_TABLET | ORAL | Status: AC
  Filled 2016-10-26: qty 1

## 2016-10-26 MED ORDER — SCOPOLAMINE 1 MG/3DAYS TD PT72
MEDICATED_PATCH | TRANSDERMAL | Status: AC
  Filled 2016-10-26: qty 1

## 2016-10-26 MED ORDER — SODIUM CHLORIDE 0.9 % IV SOLN
INTRAVENOUS | Status: DC
  Administered 2016-10-26: 09:00:00 via INTRAVENOUS

## 2016-10-26 MED ORDER — IOTHALAMATE MEGLUMINE 43 % IV SOLN
INTRAVENOUS | Status: DC | PRN
  Administered 2016-10-26: 30 mL

## 2016-10-26 MED ORDER — FAMOTIDINE 20 MG PO TABS
ORAL_TABLET | ORAL | Status: AC
  Filled 2016-10-26: qty 1

## 2016-10-26 SURGICAL SUPPLY — 28 items
BACTOSHIELD CHG 4% 4OZ (MISCELLANEOUS) ×2
BASKET ZERO TIP 1.9FR (BASKET) ×3 IMPLANT
CATH URETL 5X70 OPEN END (CATHETERS) ×3 IMPLANT
CNTNR SPEC 2.5X3XGRAD LEK (MISCELLANEOUS) ×1
CONT SPEC 4OZ STER OR WHT (MISCELLANEOUS) ×2
CONTAINER SPEC 2.5X3XGRAD LEK (MISCELLANEOUS) ×1 IMPLANT
FIBER LASER LITHO 273 (Laser) ×3 IMPLANT
GLOVE BIO SURGEON STRL SZ7 (GLOVE) ×3 IMPLANT
GLOVE BIO SURGEON STRL SZ7.5 (GLOVE) ×3 IMPLANT
GOWN STRL REUS W/ TWL LRG LVL4 (GOWN DISPOSABLE) ×1 IMPLANT
GOWN STRL REUS W/TWL LRG LVL4 (GOWN DISPOSABLE) ×2
GOWN STRL REUS W/TWL XL LVL3 (GOWN DISPOSABLE) ×3 IMPLANT
GUIDEWIRE SUPER STIFF (WIRE) ×3 IMPLANT
INTRODUCER DILATOR DOUBLE (INTRODUCER) ×3 IMPLANT
KIT RM TURNOVER CYSTO AR (KITS) ×3 IMPLANT
PACK CYSTO AR (MISCELLANEOUS) ×3 IMPLANT
SCRUB CHG 4% DYNA-HEX 4OZ (MISCELLANEOUS) ×1 IMPLANT
SENSORWIRE 0.038 NOT ANGLED (WIRE) ×6
SET CYSTO W/LG BORE CLAMP LF (SET/KITS/TRAYS/PACK) ×3 IMPLANT
SHEATH URETERAL 13/15X36 1L (SHEATH) IMPLANT
SOL .9 NS 3000ML IRR  AL (IV SOLUTION) ×2
SOL .9 NS 3000ML IRR UROMATIC (IV SOLUTION) ×1 IMPLANT
STENT URET 6FRX24 CONTOUR (STENTS) IMPLANT
STENT URET 6FRX26 CONTOUR (STENTS) ×3 IMPLANT
SURGILUBE 2OZ TUBE FLIPTOP (MISCELLANEOUS) ×3 IMPLANT
SYRINGE IRR TOOMEY STRL 70CC (SYRINGE) ×3 IMPLANT
WATER STERILE IRR 1000ML POUR (IV SOLUTION) ×3 IMPLANT
WIRE SENSOR 0.038 NOT ANGLED (WIRE) ×2 IMPLANT

## 2016-10-26 NOTE — Discharge Instructions (Signed)

## 2016-10-26 NOTE — Anesthesia Postprocedure Evaluation (Signed)
Anesthesia Post Note  Patient: Willie Crane  Procedure(s) Performed: Procedure(s) (LRB): URETEROSCOPY WITH HOLMIUM LASER LITHOTRIPSY (Left) CYSTOSCOPY WITH STENT REPLACEMENT (Left)  Patient location during evaluation: PACU Anesthesia Type: General Level of consciousness: awake and alert Pain management: pain level controlled Vital Signs Assessment: post-procedure vital signs reviewed and stable Respiratory status: spontaneous breathing and respiratory function stable Cardiovascular status: stable Anesthetic complications: no     Last Vitals:  Vitals:   10/26/16 1110 10/26/16 1121  BP:  (!) 156/103  Pulse: (!) 108 (!) 105  Resp:  16  Temp: 36.6 C (!) 35.9 C    Last Pain:  Vitals:   10/26/16 1121  TempSrc: Temporal  PainSc: 4                  Kadeen Sroka K

## 2016-10-26 NOTE — Op Note (Signed)
Date of procedure: 10/26/16  Preoperative diagnosis:  1. Left ureteral stone   Postoperative diagnosis:  1. Left ureteral stone   Procedure: 1. Cystoscopy 2. Left ureteroscopy 3. Laser lithotripsy 4. Stone basketing 5. Left retrograde pyelogram with interpretation 6. Left ureteral stent exchange 6 [place] by 26 cm  Surgeon: Baruch Gouty, MD  Anesthesia: General  Complications: None  Intraoperative findings: The patient's mid to proximal left ureteral stone was very difficult to access despite passive dilation of his left ureter with the stent. I was unable to reach the stone with the semirigid ureteroscope. However, a flexible ureteroscope was able to access the stone. One fragment was removed for stone analysis and the remainder of the stone was dusted. Left retrograde pyelogram within the procedure showed no filling defects and good drainage of the left ureter.   EBL:  none  Specimens:  left ureteral stone  Drains:  left 6 French by 26 double-J ureteral stent  Disposition: Stable to the postanesthesia care unit  Indication for procedure: The patient is a 71 y.o. male with a left ureteral stone was undergoing passive dilation of his ureter after left ureteral stent placement due to inability to access the stone on prior procedure. He presents today for definitive management.  After reviewing the management options for treatment, the patient elected to proceed with the above surgical procedure(s). We have discussed the potential benefits and risks of the procedure, side effects of the proposed treatment, the likelihood of the patient achieving the goals of the procedure, and any potential problems that might occur during the procedure or recuperation. Informed consent has been obtained.  Description of procedure: The patient was met in the preoperative area. All risks, benefits, and indications of the procedure were described in great detail. The patient consented to the procedure.  Preoperative antibiotics were given. The patient was taken to the operative theater. General anesthesia was induced per the anesthesia service. The patient was then placed in the dorsal lithotomy position and prepped and draped in the usual sterile fashion. A preoperative timeout was called.   A 21 French 30 cystoscope was inserted into the patient's bladder per urethra atraumatically. The previous placed left ureteral stent was removed with flexible graspers to level of the meatus and a sensor wire was exchanged through it up to the level of the renal pelvis under fluoroscopy. The ureteral stent was then removed. Semirigid ureteroscope was then inserted in the patient's bladder per urethra in length appears procedure the left ear orifice was difficult to navigate into. With the help of a second sensor wire placed through the ureteroscope up to level the renal pelvis is able to navigate into the distal left ureter. However his ureter was still too tight to navigate further in the ureter. At this time, the semirigid ureteroscope was removed leaving the second sensor wire behind. A flexible ureter scope was then inserted over the second sensor wire into the proximal ureter where the stone was. This was done with a great deal of difficulty in manipulation. However, I did successfully reach the stone. At this point, since those ureter was so difficult to navigate decision was made to dust the stone save one piece for stone analysis. Laser lithotripsy was used to break the stone into fragments all less then 2 mm. One piece was stable though was slightly larger for stone analysis. Pan nephroscopy in review of the proximal ureter showed no residual stone burden. Though one large piece oh save for stone analysis was grasped  with stone basket and was removed. While it was being removed the distal and mid ureter was evaluated and show no further stones or evidence of trauma. The stone was then sent for stone analysis. A  retrograde polygrams was obtained at this point with the aid of a dual-lumen catheter over the remaining sensor wire. This showed no filling defects in the ureter or left kidney. There is appropriate drainage of contrast at this point with no evidence of obstruction. At this point the cystoscope was assembled remaining sensor wire a 6 [place] by [place] double-J ureteral stent was placed. A sensor wire was removed. A curl seen in the renal pelvis on fluoroscopy and in the urinary bladder on direct embolization. There is clear drainage from the stent this time. The patient's bladder was drained and he was woken from anesthesia and transferred stable condition to postanesthesia care unit.  Plan:  the patient follow-up in one week for left renal stent removal in the office. He'll need an ultrasound one month after that time to rule out hydronephrosis.  Baruch Gouty, M.D.

## 2016-10-26 NOTE — Anesthesia Procedure Notes (Signed)
Procedure Name: LMA Insertion Date/Time: 10/26/2016 9:08 AM Performed by: Darlyne Russian Pre-anesthesia Checklist: Patient identified, Emergency Drugs available, Suction available, Patient being monitored and Timeout performed Patient Re-evaluated:Patient Re-evaluated prior to inductionOxygen Delivery Method: Circle system utilized Preoxygenation: Pre-oxygenation with 100% oxygen Intubation Type: IV induction Ventilation: Mask ventilation without difficulty LMA: LMA inserted LMA Size: 5.0 Number of attempts: 1 Placement Confirmation: positive ETCO2 Tube secured with: Tape Dental Injury: Teeth and Oropharynx as per pre-operative assessment

## 2016-10-26 NOTE — H&P (View-Only) (Signed)
09/28/2016 7:14 AM   Willie Crane 1945-09-30 [id number]  Referring provider: Arnetha Courser, MD [address] [place], [place] [address]  CC - new patient, nephrolithiasis  HPI:  1. Nephrolithiasis - 16mm left prox stone with mod hydro by ER CT 09/2016. Some Cr bump to 1.8 (baseline <1.2). UA without infectious parameters. Stone is solitary, 28mm, 920HU, SSD 18cm.  2. BPH with obstruction/lower urinary tract symptoms - on tamsulosin + finasteride at baseline for mild bother from obstructive symptoms. Prostate vol 11mL by CT ellipsoid calculation 2018.   3. Hydrocele, bilateral - non-complex bilateral hydroceles on exam and imaging x several. No bother.   4. Bilateral Simple Renal Cysts - Rt 18cm mid, 4cm lower and Left 4cm non-complex renal cysts. Contrast CT 2017 w/o enhancing nodules, coarse calcifications, mass effect. Stable on imaging x several  5 - Prostate Screening -   09/2016 PSA 1.3 (PCP labs, on finasteride)  PMH sig for obesity, lap chole, ortho surgery. His PCP is Enid Derry MD  Today "Willie Crane" is seen as new patient for above.    PMH: Past Medical History:  Diagnosis Date  . Anxiety   . Asthma   . Benign paroxysmal positional vertigo   . BPH (benign prostatic hypertrophy)   . Complication of anesthesia   . COPD (chronic obstructive pulmonary disease) ([place])   . Coronary artery disease    history of heart attack  . Degeneration of lumbar or lumbosacral intervertebral disc   . History of kidney stones   . Hyperlipidemia   . Hypertension   . MI (myocardial infarction)    1980s  . Obesity, unspecified   . PONV (postoperative nausea and vomiting)   . PTSD (post-traumatic stress disorder)   . Thoracic or lumbosacral neuritis or radiculitis, unspecified   . Unspecified congenital anomaly of the integument   . Unspecified gastritis and gastroduodenitis without mention of hemorrhage     Surgical History: Past Surgical History:  Procedure  Laterality Date  . BACK SURGERY    . CARDIAC CATHETERIZATION  03/2009   [facility]: No significant coronary artery disease  . CARDIAC CATHETERIZATION  01/2014   [facility]  . CARPAL TUNNEL RELEASE Right   . CHOLECYSTECTOMY    . COLONOSCOPY    . COLONOSCOPY WITH PROPOFOL N/A 10/31/2015   Procedure: COLONOSCOPY WITH PROPOFOL;  Surgeon: Lucilla Lame, MD;  Location: [facility];  Service: Endoscopy;  Laterality: N/A;  . CYST EXCISION    . HERNIA REPAIR    . INGUINAL HERNIA REPAIR Right   . INGUINAL HERNIA REPAIR Left 01/26/2016   Procedure: HERNIA REPAIR INGUINAL ADULT;  Surgeon: Christene Lye, MD;  Location: [facility];  Service: General;  Laterality: Left;  . KNEE ARTHROSCOPY    . LUMBAR LAMINECTOMY    . POLYPECTOMY  10/31/2015   Procedure: POLYPECTOMY;  Surgeon: Lucilla Lame, MD;  Location: [facility];  Service: Endoscopy;;  . VASECTOMY      Home Medications:  Allergies as of 09/28/2016      Reactions   Other Swelling   Lips swell - Potatoes, Kiwi, carrots, celery (raw)        Medication List       Accurate as of 09/28/16  7:14 AM. Always use your most recent med list.          albuterol 108 (90 Base) MCG/ACT inhaler Commonly known as:  PROVENTIL HFA Inhale 2 puffs into the lungs every 4 (four) hours as needed for wheezing  or shortness of breath.   ascorbic acid 250 MG Chew Commonly known as:  VITAMIN C Chew 500 mg by mouth daily.   budesonide-formoterol 160-4.5 MCG/ACT inhaler Commonly known as:  SYMBICORT Inhale 2 puffs into the lungs 2 (two) times daily.   finasteride 5 MG tablet Commonly known as:  PROSCAR Take 1 tablet (5 mg total) by mouth daily.   FLUoxetine 20 MG capsule Commonly known as:  PROZAC TAKE 1 CAPSULE (20 MG TOTAL) BY MOUTH DAILY.   HYDROcodone-acetaminophen 5-325 MG tablet Commonly known as:  NORCO Take 1 tablet by mouth every 4 (four) hours as needed for moderate pain.   MULTI ADULT GUMMIES Chew Chew 1 tablet by mouth daily.     olmesartan 20 MG tablet Commonly known as:  BENICAR Take 1 tablet (20 mg total) by mouth daily.   promethazine 25 MG tablet Commonly known as:  PHENERGAN Take 1 tablet (25 mg total) by mouth every 6 (six) hours as needed for nausea or vomiting.   tamsulosin 0.4 MG Caps capsule Commonly known as:  FLOMAX Take 1 capsule (0.4 mg total) by mouth daily after supper.       Allergies:  Allergies  Allergen Reactions  . Other Swelling    Lips swell - Potatoes, Kiwi, carrots, celery (raw)      Family History: Family History  Problem Relation Age of Onset  . Kidney failure Mother   . Cancer Mother   . Cancer Father     lung  . Prostate cancer Neg Hx     Social History:  reports that he quit smoking about 37 years ago. His smoking use included Cigarettes. He has a 20.00 pack-year smoking history. He has quit using smokeless tobacco. His smokeless tobacco use included Chew. He reports that he drinks alcohol. He reports that he does not use drugs.   Review of Systems  Gastrointestinal (upper)  : Negative for upper GI symptoms  Gastrointestinal (lower) : Negative for lower GI symptoms  Constitutional : Negative for symptoms  Skin: Negative for skin symptoms  Eyes: Negative for eye symptoms  Ear/Nose/Throat : Negative for Ear/Nose/Throat symptoms  Hematologic/Lymphatic: Negative for Hematologic/Lymphatic symptoms  Cardiovascular : Negative for cardiovascular symptoms  Respiratory : Negative for respiratory symptoms  Endocrine: Negative for endocrine symptoms  Musculoskeletal: Negative for musculoskeletal symptoms  Neurological: Negative for neurological symptoms  Psychologic: Negative for psychiatric symptoms  Physical Exam: There were no vitals taken for this visit.  Constitutional:  Alert and oriented, No acute distress. HEENT: [place] AT, moist mucus membranes.  Trachea midline, no masses. Cardiovascular: No clubbing, cyanosis, or edema. Respiratory:  Normal respiratory effort, no increased work of breathing. GI: Abdomen is soft, nontender, nondistended, no abdominal masses GU: Moderate LEFT CVAT. Phallus straight. Bilateral soft hydroceles.  Skin: No rashes, bruises or suspicious lesions. Lymph: No cervical or inguinal adenopathy. Neurologic: Grossly intact, no focal deficits, moving all 4 extremities. Psychiatric: Normal mood and affect.  Laboratory Data: Lab Results  Component Value Date   WBC 10.7 (H) 09/27/2016   HGB 14.4 09/27/2016   HCT 42.3 09/27/2016   MCV 88.5 09/27/2016   PLT 193 09/27/2016    Lab Results  Component Value Date   CREATININE 1.87 (H) 09/27/2016    No results found for: PSA  No results found for: TESTOSTERONE  No results found for: HGBA1C  Urinalysis    Component Value Date/Time   COLORURINE YELLOW (A) 09/26/2016 1919   APPEARANCEUR CLEAR (A) 09/26/2016 1919   APPEARANCEUR Clear  01/04/2016 1152   LABSPEC 1.020 09/26/2016 1919   PHURINE 5.0 09/26/2016 1919   GLUCOSEU NEGATIVE 09/26/2016 1919   HGBUR NEGATIVE 09/26/2016 1919   BILIRUBINUR neg 09/27/2016 1538   BILIRUBINUR Negative 01/04/2016 1152   KETONESUR 20 (A) 09/26/2016 1919   PROTEINUR neg 09/27/2016 1538   PROTEINUR NEGATIVE 09/26/2016 1919   UROBILINOGEN 0.2 09/27/2016 1538   NITRITE neg 09/27/2016 1538   NITRITE NEGATIVE 09/26/2016 1919   LEUKOCYTESUR Negative 09/27/2016 1538   LEUKOCYTESUR Negative 01/04/2016 1152    Pertinent Imaging: as per HPI  Assessment & Plan:    1. Nephrolithiasis - large stone with refracotry sympotms and now some GFR decline. Discussed options of continued medical therapy (very unlikely to pass), shockwave lithotripsy (his skin to stone distance and stone size do not predict good response), or ureteroscopy (highest liklihood of success). He opts for ureteroscopy today and I agree. Risks, benefits, expected peri-op course, need for likely peri-op temporary ureteral stent, need for possible staged  approach discussed.   2. BPH with obstruction/lower urinary tract symptoms - continued tamsulosin + finasteride, would need outlet procedure of progresses.   3. Hydrocele, bilateral -non-bothersome, observe.   4. Bilateral Simple Renal Cysts - non-bothersome, observe.   5 - Prostate Screening - up to date on screenign. Would refrain from further PSA based screengin as >70 and average risk.      No Follow-up on file.  Alexis Frock, [facility] [address] [place], [place] [address] [phone]

## 2016-10-26 NOTE — Transfer of Care (Signed)
Immediate Anesthesia Transfer of Care Note  Patient: Willie Crane  Procedure(s) Performed: Procedure(s): URETEROSCOPY WITH HOLMIUM LASER LITHOTRIPSY (Left) CYSTOSCOPY WITH STENT REPLACEMENT (Left)  Patient Location: PACU  Anesthesia Type:General  Level of Consciousness: unresponsive  Airway & Oxygen Therapy: Patient Spontanous Breathing and Patient connected to nasal cannula oxygen  Post-op Assessment: Report given to RN and Post -op Vital signs reviewed and stable  Post vital signs: Reviewed and stable  Last Vitals:  Vitals:   10/26/16 0754 10/26/16 1014  BP: (!) 150/91 (!) 135/98  Pulse: 100 95  Resp: 17 16  Temp: 36.4 C 36.4 C    Last Pain:  Vitals:   10/26/16 1014  TempSrc: Temporal  PainSc: Asleep         Complications: No apparent anesthesia complications

## 2016-10-26 NOTE — Interval H&P Note (Signed)
History and Physical Interval Note:  10/26/2016 8:45 AM  Willie Crane  has presented today for surgery, with the diagnosis of LEFT URETERAL STONE  The various methods of treatment have been discussed with the patient and family. After consideration of risks, benefits and other options for treatment, the patient has consented to  Procedure(s): URETEROSCOPY WITH HOLMIUM LASER LITHOTRIPSY (Left) CYSTOSCOPY WITH STENT REPLACEMENT (Left) as a surgical intervention .  The patient's history has been reviewed, patient examined, no change in status, stable for surgery.  I have reviewed the patient's chart and labs.  Questions were answered to the patient's satisfaction.    RRR Lungs clear  Patient presents for ureteroscopy after passive dilation of left ureter with ureteral stent after failed left ureteroscopy a few weeks ago.  Nickie Retort

## 2016-10-26 NOTE — Anesthesia Preprocedure Evaluation (Signed)
Anesthesia Evaluation  Patient identified by MRN, date of birth, ID band Patient awake    Reviewed: Allergy & Precautions, NPO status , Patient's Chart, lab work & pertinent test results  History of Anesthesia Complications (+) PONV  Airway Mallampati: II       Dental   Pulmonary asthma , COPD,  COPD inhaler, former smoker,           Cardiovascular hypertension, Pt. on medications + CAD and + Past MI       Neuro/Psych Anxiety negative neurological ROS     GI/Hepatic Neg liver ROS, GERD  ,  Endo/Other  negative endocrine ROS  Renal/GU Renal disease (stones)     Musculoskeletal   Abdominal   Peds  Hematology   Anesthesia Other Findings   Reproductive/Obstetrics                             Anesthesia Physical Anesthesia Plan  ASA: III  Anesthesia Plan: General   Post-op Pain Management:    Induction: Intravenous  Airway Management Planned: LMA  Additional Equipment:   Intra-op Plan:   Post-operative Plan:   Informed Consent: I have reviewed the patients History and Physical, chart, labs and discussed the procedure including the risks, benefits and alternatives for the proposed anesthesia with the patient or authorized representative who has indicated his/her understanding and acceptance.     Plan Discussed with:   Anesthesia Plan Comments:         Anesthesia Quick Evaluation

## 2016-10-26 NOTE — Anesthesia Post-op Follow-up Note (Cosign Needed)
Anesthesia QCDR form completed.        

## 2016-10-27 ENCOUNTER — Encounter: Payer: Self-pay | Admitting: Urology

## 2016-10-29 MED FILL — Dextrose Inj 5%: INTRAVENOUS | Qty: 50 | Status: AC

## 2016-10-29 MED FILL — Ceftriaxone Sodium For Inj 10 GM: INTRAVENOUS | Qty: 2 | Status: AC

## 2016-11-04 ENCOUNTER — Emergency Department: Payer: Medicare Other

## 2016-11-04 ENCOUNTER — Inpatient Hospital Stay
Admission: EM | Admit: 2016-11-04 | Discharge: 2016-11-07 | DRG: 690 | Disposition: A | Discharge status: Home / Self Care | Payer: Medicare Other | Attending: Internal Medicine | Admitting: Internal Medicine

## 2016-11-04 ENCOUNTER — Encounter: Payer: Self-pay | Admitting: Emergency Medicine

## 2016-11-04 DIAGNOSIS — E785 Hyperlipidemia, unspecified: Secondary | ICD-10-CM | POA: Diagnosis present

## 2016-11-04 DIAGNOSIS — N12 Tubulo-interstitial nephritis, not specified as acute or chronic: Secondary | ICD-10-CM | POA: Diagnosis not present

## 2016-11-04 DIAGNOSIS — Z87891 Personal history of nicotine dependence: Secondary | ICD-10-CM

## 2016-11-04 DIAGNOSIS — Z7951 Long term (current) use of inhaled steroids: Secondary | ICD-10-CM

## 2016-11-04 DIAGNOSIS — Z87442 Personal history of urinary calculi: Secondary | ICD-10-CM

## 2016-11-04 DIAGNOSIS — Z9049 Acquired absence of other specified parts of digestive tract: Secondary | ICD-10-CM | POA: Diagnosis not present

## 2016-11-04 DIAGNOSIS — R000 Tachycardia, unspecified: Secondary | ICD-10-CM | POA: Diagnosis not present

## 2016-11-04 DIAGNOSIS — Z960 Presence of urogenital implants: Secondary | ICD-10-CM | POA: Diagnosis present

## 2016-11-04 DIAGNOSIS — I10 Essential (primary) hypertension: Secondary | ICD-10-CM | POA: Diagnosis present

## 2016-11-04 DIAGNOSIS — N200 Calculus of kidney: Secondary | ICD-10-CM | POA: Diagnosis not present

## 2016-11-04 DIAGNOSIS — J449 Chronic obstructive pulmonary disease, unspecified: Secondary | ICD-10-CM | POA: Diagnosis present

## 2016-11-04 DIAGNOSIS — N400 Enlarged prostate without lower urinary tract symptoms: Secondary | ICD-10-CM | POA: Diagnosis present

## 2016-11-04 DIAGNOSIS — Z801 Family history of malignant neoplasm of trachea, bronchus and lung: Secondary | ICD-10-CM

## 2016-11-04 DIAGNOSIS — Z91018 Allergy to other foods: Secondary | ICD-10-CM | POA: Diagnosis not present

## 2016-11-04 DIAGNOSIS — F419 Anxiety disorder, unspecified: Secondary | ICD-10-CM | POA: Diagnosis present

## 2016-11-04 DIAGNOSIS — F329 Major depressive disorder, single episode, unspecified: Secondary | ICD-10-CM | POA: Diagnosis present

## 2016-11-04 DIAGNOSIS — R110 Nausea: Secondary | ICD-10-CM | POA: Diagnosis not present

## 2016-11-04 DIAGNOSIS — E876 Hypokalemia: Secondary | ICD-10-CM | POA: Diagnosis present

## 2016-11-04 DIAGNOSIS — N281 Cyst of kidney, acquired: Secondary | ICD-10-CM | POA: Diagnosis present

## 2016-11-04 DIAGNOSIS — H8110 Benign paroxysmal vertigo, unspecified ear: Secondary | ICD-10-CM | POA: Diagnosis present

## 2016-11-04 DIAGNOSIS — Z6834 Body mass index (BMI) 34.0-34.9, adult: Secondary | ICD-10-CM

## 2016-11-04 DIAGNOSIS — F4310 Post-traumatic stress disorder, unspecified: Secondary | ICD-10-CM | POA: Diagnosis present

## 2016-11-04 DIAGNOSIS — N10 Acute tubulo-interstitial nephritis: Secondary | ICD-10-CM | POA: Diagnosis present

## 2016-11-04 DIAGNOSIS — I2510 Atherosclerotic heart disease of native coronary artery without angina pectoris: Secondary | ICD-10-CM | POA: Diagnosis present

## 2016-11-04 DIAGNOSIS — Z841 Family history of disorders of kidney and ureter: Secondary | ICD-10-CM

## 2016-11-04 DIAGNOSIS — D72829 Elevated white blood cell count, unspecified: Secondary | ICD-10-CM | POA: Diagnosis not present

## 2016-11-04 DIAGNOSIS — E669 Obesity, unspecified: Secondary | ICD-10-CM | POA: Diagnosis present

## 2016-11-04 DIAGNOSIS — B952 Enterococcus as the cause of diseases classified elsewhere: Secondary | ICD-10-CM | POA: Diagnosis present

## 2016-11-04 DIAGNOSIS — R509 Fever, unspecified: Secondary | ICD-10-CM | POA: Diagnosis not present

## 2016-11-04 DIAGNOSIS — J4540 Moderate persistent asthma, uncomplicated: Secondary | ICD-10-CM | POA: Diagnosis present

## 2016-11-04 DIAGNOSIS — Z79899 Other long term (current) drug therapy: Secondary | ICD-10-CM | POA: Diagnosis not present

## 2016-11-04 DIAGNOSIS — I252 Old myocardial infarction: Secondary | ICD-10-CM

## 2016-11-04 HISTORY — DX: Disorder of kidney and ureter, unspecified: N28.9

## 2016-11-04 LAB — COMPREHENSIVE METABOLIC PANEL
ALBUMIN: 3.9 g/dL (ref 3.5–5.0)
ALT: 41 U/L (ref 17–63)
AST: 27 U/L (ref 15–41)
Alkaline Phosphatase: 81 U/L (ref 38–126)
Anion gap: 9 (ref 5–15)
BILIRUBIN TOTAL: 1.4 mg/dL — AB (ref 0.3–1.2)
BUN: 15 mg/dL (ref 6–20)
CHLORIDE: 105 mmol/L (ref 101–111)
CO2: 23 mmol/L (ref 22–32)
Calcium: 9.1 mg/dL (ref 8.9–10.3)
Creatinine, Ser: 1.06 mg/dL (ref 0.61–1.24)
GFR calc Af Amer: >60 mL/min (ref >60)
GFR calc non Af Amer: >60 mL/min (ref >60)
GLUCOSE: 130 mg/dL — AB (ref 65–99)
POTASSIUM: 3.5 mmol/L (ref 3.5–5.1)
Sodium: 137 mmol/L (ref 135–145)
Total Protein: 7.7 g/dL (ref 6.5–8.1)

## 2016-11-04 LAB — CBC
HEMATOCRIT: 45.2 % (ref 40.0–52.0)
Hemoglobin: 15.6 g/dL (ref 13.0–18.0)
MCH: 30 pg (ref 26.0–34.0)
MCHC: 34.5 g/dL (ref 32.0–36.0)
MCV: 87 fL (ref 80.0–100.0)
Platelets: 206 10*3/uL (ref 150–440)
RBC: 5.2 MIL/uL (ref 4.40–5.90)
RDW: 13.2 % (ref 11.5–14.5)
WBC: 16.7 10*3/uL — ABNORMAL HIGH (ref 3.8–10.6)

## 2016-11-04 LAB — URINALYSIS, COMPLETE (UACMP) WITH MICROSCOPIC
BILIRUBIN URINE: NEGATIVE
Glucose, UA: NEGATIVE mg/dL
KETONES UR: NEGATIVE mg/dL
Nitrite: NEGATIVE
Protein, ur: 30 mg/dL — AB
SQUAMOUS EPITHELIAL / LPF: NONE SEEN
Specific Gravity, Urine: 1.017 (ref 1.005–1.030)
pH: 6 (ref 5.0–8.0)

## 2016-11-04 LAB — INFLUENZA PANEL BY PCR (TYPE A & B)
Influenza A By PCR: NEGATIVE
Influenza B By PCR: NEGATIVE

## 2016-11-04 LAB — LIPASE, BLOOD: LIPASE: 14 U/L (ref 11–51)

## 2016-11-04 MED ORDER — ONDANSETRON 4 MG PO TBDP
ORAL_TABLET | ORAL | Status: AC
  Administered 2016-11-04: 4 mg via ORAL
  Filled 2016-11-04: qty 1

## 2016-11-04 MED ORDER — PROMETHAZINE HCL 25 MG PO TABS
25.0000 mg | ORAL_TABLET | Freq: Four times a day (QID) | ORAL | Status: DC | PRN
  Administered 2016-11-05: 25 mg via ORAL
  Filled 2016-11-04 (×2): qty 1

## 2016-11-04 MED ORDER — CEFTRIAXONE SODIUM-DEXTROSE 1-3.74 GM-% IV SOLR
1.0000 g | INTRAVENOUS | Status: DC
  Filled 2016-11-04: qty 50

## 2016-11-04 MED ORDER — SODIUM CHLORIDE 0.9 % IV BOLUS (SEPSIS)
1000.0000 mL | Freq: Once | INTRAVENOUS | Status: AC
  Administered 2016-11-04: 1000 mL via INTRAVENOUS

## 2016-11-04 MED ORDER — IOPAMIDOL (ISOVUE-300) INJECTION 61%
100.0000 mL | Freq: Once | INTRAVENOUS | Status: AC | PRN
Start: 2016-11-04 — End: 2016-11-04
  Administered 2016-11-04: 100 mL via INTRAVENOUS

## 2016-11-04 MED ORDER — PROMETHAZINE HCL 25 MG/ML IJ SOLN
INTRAMUSCULAR | Status: AC
  Filled 2016-11-04: qty 1

## 2016-11-04 MED ORDER — ACETAMINOPHEN 325 MG PO TABS
650.0000 mg | ORAL_TABLET | Freq: Four times a day (QID) | ORAL | Status: DC | PRN
  Administered 2016-11-05 – 2016-11-06 (×3): 650 mg via ORAL
  Filled 2016-11-04 (×3): qty 2

## 2016-11-04 MED ORDER — ONDANSETRON 4 MG PO TBDP
4.0000 mg | ORAL_TABLET | Freq: Once | ORAL | Status: AC | PRN
  Administered 2016-11-04: 4 mg via ORAL

## 2016-11-04 MED ORDER — ONDANSETRON HCL 4 MG/2ML IJ SOLN
4.0000 mg | Freq: Once | INTRAMUSCULAR | Status: AC
  Administered 2016-11-04: 4 mg via INTRAVENOUS
  Filled 2016-11-04: qty 2

## 2016-11-04 MED ORDER — DEXTROSE 5 % IV SOLN
400.0000 mg | INTRAVENOUS | Status: DC
  Administered 2016-11-05: 400 mg via INTRAVENOUS
  Filled 2016-11-04 (×2): qty 10

## 2016-11-04 MED ORDER — BARIUM SULFATE 2 % PO SUSP
450.0000 mL | Freq: Once | ORAL | Status: AC
  Administered 2016-11-04: 450 mL via ORAL

## 2016-11-04 MED ORDER — DEXTROSE 5 % IV SOLN: 1.0000 g | Freq: Once | INTRAVENOUS | Status: DC

## 2016-11-04 MED ORDER — PROMETHAZINE HCL 25 MG/ML IJ SOLN
25.0000 mg | Freq: Once | INTRAVENOUS | Status: AC
  Administered 2016-11-04: 25 mg via INTRAVENOUS
  Filled 2016-11-04: qty 1

## 2016-11-04 MED ORDER — CEFTRIAXONE SODIUM-DEXTROSE 1-3.74 GM-% IV SOLR
1.0000 g | Freq: Once | INTRAVENOUS | Status: AC
  Administered 2016-11-04: 1 g via INTRAVENOUS
  Filled 2016-11-04 (×2): qty 50

## 2016-11-04 MED ORDER — GENTAMICIN SULFATE 40 MG/ML IJ SOLN: 7.0000 mg/kg | INTRAVENOUS | Status: DC

## 2016-11-04 MED ORDER — DEXTROSE 5 % IV SOLN: 1.0000 g | INTRAVENOUS | Status: DC

## 2016-11-04 NOTE — ED Triage Notes (Signed)
Pt reports vomiting all night and today, reports 2 advil and 1 zofran around 1400.

## 2016-11-04 NOTE — ED Notes (Signed)
MD at bedside. 

## 2016-11-04 NOTE — H&P (Signed)
[facility] NAME: Willie Crane    MR#:  [id number]  DATE OF BIRTH:  1946-05-30  DATE OF ADMISSION:  11/04/2016  PRIMARY CARE PHYSICIAN: Enid Derry, MD   REQUESTING/REFERRING PHYSICIAN: [email or web address]  CHIEF COMPLAINT:   Chief Complaint  Patient presents with  . Emesis    HISTORY OF PRESENT ILLNESS: Willie Crane  is a 71 y.o. male with a known history of Anxiety, benign prostatic hypertrophy, paroxysmal positional vertigo, COPD, coronary artery disease, kidney stone, hyperlipidemia, hypertension, posttraumatic stress disorder- had kidney stone and laser guided lithotripsy was done last week and the ureteral stent was placed in urologist scheduled to come for stent removal next week. Since today at 2 AM he started having excessive nausea and vomiting and not able to keep any food down so came to emergency room. He is noted to have UTI and so given to hospitalist team for further management  PAST MEDICAL HISTORY:   Past Medical History:  Diagnosis Date  . Anxiety   . Asthma   . Benign paroxysmal positional vertigo   . BPH (benign prostatic hypertrophy)   . Complication of anesthesia   . COPD (chronic obstructive pulmonary disease) ([place])   . Coronary artery disease    history of heart attack  . Degeneration of lumbar or lumbosacral intervertebral disc   . History of kidney stones   . Hyperlipidemia   . Hypertension   . MI (myocardial infarction)    1980s  . Obesity, unspecified   . PONV (postoperative nausea and vomiting)   . PTSD (post-traumatic stress disorder)   . Renal disorder    kidney stones  . Thoracic or lumbosacral neuritis or radiculitis, unspecified   . Unspecified congenital anomaly of the integument   . Unspecified gastritis and gastroduodenitis without mention of hemorrhage     PAST SURGICAL HISTORY: Past Surgical History:  Procedure Laterality Date  . BACK SURGERY    . CARDIAC CATHETERIZATION  03/2009    [facility]: No significant coronary artery disease  . CARDIAC CATHETERIZATION  01/2014   [facility]  . CARPAL TUNNEL RELEASE Right   . CHOLECYSTECTOMY    . COLONOSCOPY    . COLONOSCOPY WITH PROPOFOL N/A 10/31/2015   Procedure: COLONOSCOPY WITH PROPOFOL;  Surgeon: Lucilla Lame, MD;  Location: [facility];  Service: Endoscopy;  Laterality: N/A;  . CYST EXCISION    . CYSTOSCOPY W/ URETERAL STENT PLACEMENT Left 10/26/2016   Procedure: CYSTOSCOPY WITH STENT REPLACEMENT;  Surgeon: Nickie Retort, MD;  Location: [facility];  Service: Urology;  Laterality: Left;  . CYSTOSCOPY WITH STENT PLACEMENT Left 10/05/2016   Procedure: CYSTOSCOPY WITH STENT PLACEMENT;  Surgeon: Nickie Retort, MD;  Location: [facility];  Service: Urology;  Laterality: Left;  . HERNIA REPAIR    . INGUINAL HERNIA REPAIR Right   . INGUINAL HERNIA REPAIR Left 01/26/2016   Procedure: HERNIA REPAIR INGUINAL ADULT;  Surgeon: Christene Lye, MD;  Location: [facility];  Service: General;  Laterality: Left;  . KNEE ARTHROSCOPY    . LUMBAR LAMINECTOMY    . POLYPECTOMY  10/31/2015   Procedure: POLYPECTOMY;  Surgeon: Lucilla Lame, MD;  Location: [facility];  Service: Endoscopy;;  . URETEROSCOPY Left 10/05/2016   Procedure: URETEROSCOPY;  Surgeon: Nickie Retort, MD;  Location: [facility];  Service: Urology;  Laterality: Left;  . URETEROSCOPY WITH HOLMIUM LASER LITHOTRIPSY Left 10/26/2016   Procedure: URETEROSCOPY WITH HOLMIUM LASER LITHOTRIPSY;  Surgeon: Nickie Retort, MD;  Location: [facility];  Service: Urology;  Laterality: Left;  Marland Kitchen VASECTOMY      SOCIAL HISTORY:  Social History  Substance Use Topics  . Smoking status: Former Smoker    Packs/day: 1.00    Years: 20.00    Types: Cigarettes    Quit date: 01/19/1979  . Smokeless tobacco: Former Systems developer    Types: Chew     Comment: quit smoking 30 years  . Alcohol use 0.0 oz/week     Comment: wine 1x/mo    FAMILY HISTORY:  Family History  Problem Relation Age of Onset   . Kidney failure Mother   . Cancer Mother   . Cancer Father     lung  . Prostate cancer Neg Hx     DRUG ALLERGIES:  Allergies  Allergen Reactions  . Other Swelling    Lips swell - Potatoes, Kiwi, carrots, celery (raw)  general anesthesia - nausea     REVIEW OF SYSTEMS:   CONSTITUTIONAL: No fever, fatigue or weakness.  EYES: No blurred or double vision.  EARS, NOSE, AND THROAT: No tinnitus or ear pain.  RESPIRATORY: No cough, shortness of breath, wheezing or hemoptysis.  CARDIOVASCULAR: No chest pain, orthopnea, edema.  GASTROINTESTINAL: Positive for nausea, vomiting, no abdominal pain.  GENITOURINARY: No dysuria, hematuria.  ENDOCRINE: No polyuria, nocturia,  HEMATOLOGY: No anemia, easy bruising or bleeding SKIN: No rash or lesion. MUSCULOSKELETAL: No joint pain or arthritis.   NEUROLOGIC: No tingling, numbness, weakness.  PSYCHIATRY: No anxiety or depression.   MEDICATIONS AT HOME:  Prior to Admission medications   Medication Sig Start Date End Date Taking? Authorizing Provider  albuterol (PROVENTIL HFA) 108 (90 Base) MCG/ACT inhaler Inhale 2 puffs into the lungs every 4 (four) hours as needed for wheezing or shortness of breath. 08/13/16  Yes Flora Lipps, MD  Bioflavonoid Products (VITAMIN C) CHEW Take 240 mgs once daily   Yes Historical Provider, MD  budesonide-formoterol (SYMBICORT) 160-4.5 MCG/ACT inhaler Inhale 2 puffs into the lungs 2 (two) times daily. 08/13/16  Yes Flora Lipps, MD  finasteride (PROSCAR) 5 MG tablet Take 1 tablet (5 mg total) by mouth daily. 07/10/16  Yes Shannon A McGowan, PA-C  FLUoxetine (PROZAC) 20 MG capsule TAKE 1 CAPSULE (20 MG TOTAL) BY MOUTH DAILY. 06/30/16  Yes Arnetha Courser, MD  Multiple Vitamins-Minerals (MULTI ADULT GUMMIES) CHEW Chew 1 tablet by mouth daily.   Yes Historical Provider, MD  olmesartan (BENICAR) 20 MG tablet Take 1 tablet (20 mg total) by mouth daily. 07/02/16  Yes Arnetha Courser, MD  cephALEXin (KEFLEX) 500 MG capsule Take  1 capsule (500 mg total) by mouth 3 (three) times daily. Patient not taking: Reported on 11/04/2016 10/26/16   Nickie Retort, MD  diphenhydrAMINE (BENADRYL) 25 MG tablet Take 25 mg by mouth 2 (two) times daily as needed for allergies.    Historical Provider, MD  Ketotifen Fumarate (ALLERGY EYE DROPS OP) Apply 1 drop to eye daily as needed (allergies).    Historical Provider, MD  ondansetron (ZOFRAN) 4 MG tablet Take 1 tablet (4 mg total) by mouth every 8 (eight) hours as needed for nausea or vomiting. Patient not taking: Reported on 11/04/2016 10/05/16   Nickie Retort, MD  oxyCODONE-acetaminophen (ROXICET) 5-325 MG tablet Take 1-2 tablets by mouth every 6 (six) hours as needed for severe pain. From kidney stone Patient not taking: Reported on 11/04/2016 10/26/16 10/26/17  Nickie Retort, MD  promethazine (PHENERGAN) 25 MG tablet Take 1 tablet (25 mg total)  by mouth every 6 (six) hours as needed for nausea or vomiting. Patient not taking: Reported on 10/02/2016 09/27/16   Arnetha Courser, MD  sennosides-docusate sodium (SENOKOT-S) 8.6-50 MG tablet Take 2 tablets by mouth daily.    Historical Provider, MD  tamsulosin (FLOMAX) 0.4 MG CAPS capsule Take 1 capsule (0.4 mg total) by mouth daily. 10/08/16   Nickie Retort, MD      PHYSICAL EXAMINATION:   VITAL SIGNS: Blood pressure (!) 147/65, pulse (!) 102, temperature 98.5 F (36.9 C), temperature source Oral, resp. rate 20, height 5\' 7"  (1.702 m), weight 104.3 kg (230 lb), SpO2 97 %.  GENERAL:  71 y.o.-year-old patient lying in the bed with no acute distress.  EYES: Pupils equal, round, reactive to light and accommodation. No scleral icterus. Extraocular muscles intact.  HEENT: Head atraumatic, normocephalic. Oropharynx and nasopharynx clear.  NECK:  Supple, no jugular venous distention. No thyroid enlargement, no tenderness.  LUNGS: Normal breath sounds bilaterally, no wheezing, rales,rhonchi or crepitation. No use of accessory muscles of  respiration.  CARDIOVASCULAR: S1, S2 normal. No murmurs, rubs, or gallops.  ABDOMEN: Soft, Mild tender , nondistended. Bowel sounds present. No organomegaly or mass.  EXTREMITIES: No pedal edema, cyanosis, or clubbing.  NEUROLOGIC: Cranial nerves II through XII are intact. Muscle strength 5/5 in all extremities. Sensation intact. Gait not checked.  PSYCHIATRIC: The patient is alert and oriented x 3.  SKIN: No obvious rash, lesion, or ulcer.   LABORATORY PANEL:   CBC  Recent Labs Lab 11/04/16 1631  WBC 16.7*  HGB 15.6  HCT 45.2  PLT 206  MCV 87.0  MCH 30.0  MCHC 34.5  RDW 13.2   ------------------------------------------------------------------------------------------------------------------  Chemistries   Recent Labs Lab 11/04/16 1631  NA 137  K 3.5  CL 105  CO2 23  GLUCOSE 130*  BUN 15  CREATININE 1.06  CALCIUM 9.1  AST 27  ALT 41  ALKPHOS 81  BILITOT 1.4*   ------------------------------------------------------------------------------------------------------------------ estimated creatinine clearance is 72.5 mL/min (by C-G formula based on SCr of 1.06 mg/dL). ------------------------------------------------------------------------------------------------------------------ No results for input(s): TSH, T4TOTAL, T3FREE, THYROIDAB in the last 72 hours.  Invalid input(s): FREET3   Coagulation profile No results for input(s): INR, PROTIME in the last 168 hours. ------------------------------------------------------------------------------------------------------------------- No results for input(s): DDIMER in the last 72 hours. -------------------------------------------------------------------------------------------------------------------  Cardiac Enzymes No results for input(s): CKMB, TROPONINI, MYOGLOBIN in the last 168 hours.  Invalid input(s):  CK ------------------------------------------------------------------------------------------------------------------ Invalid input(s): POCBNP  ---------------------------------------------------------------------------------------------------------------  Urinalysis    Component Value Date/Time   COLORURINE YELLOW (A) 11/04/2016 2114   APPEARANCEUR HAZY (A) 11/04/2016 2114   APPEARANCEUR Clear 09/28/2016 1457   LABSPEC 1.017 11/04/2016 2114   PHURINE 6.0 11/04/2016 2114   GLUCOSEU NEGATIVE 11/04/2016 2114   HGBUR MODERATE (A) 11/04/2016 2114   BILIRUBINUR NEGATIVE 11/04/2016 2114   BILIRUBINUR Negative 09/28/2016 [place] 11/04/2016 2114   PROTEINUR 30 (A) 11/04/2016 2114   UROBILINOGEN 0.2 09/27/2016 1538   NITRITE NEGATIVE 11/04/2016 2114   LEUKOCYTESUR MODERATE (A) 11/04/2016 2114   LEUKOCYTESUR Negative 09/28/2016 1457     RADIOLOGY: Ct Abdomen Pelvis W Contrast  Result Date: 11/04/2016 CLINICAL DATA:  LEFT lower quadrant pain, vomiting for 2 days, cystoscopy with stent placement and lithotripsy 5 days ago, past history of COPD, asthma, hypertension, kidney stones, 3 hernia repairs, coronary disease post MI, cholecystectomy, former smoker EXAM: CT ABDOMEN AND PELVIS WITH CONTRAST TECHNIQUE: Multidetector CT imaging of the abdomen and pelvis was performed using the standard  protocol following bolus administration of intravenous contrast. Sagittal and coronal MPR images reconstructed from axial data set. CONTRAST:  157mL ISOVUE-300 IOPAMIDOL (ISOVUE-300) INJECTION 61% IV. Dilute oral contrast. COMPARISON:  09/26/2016 FINDINGS: Lower chest: Lung bases clear Hepatobiliary: Gallbladder surgically absent. Fatty infiltration of liver. No focal hepatic abnormalities or biliary dilatation Pancreas: Normal appearance Spleen: Normal appearance Adrenals/Urinary Tract: Adrenal glands normal appearance. BILATERAL renal cysts measuring up to 13.2 x 8.4 x 11.0 cm RIGHT and 6.6 x  5.9 x 6.5 cm LEFT. LEFT ureteral stent extends from renal pelvis into urinary bladder. Question tiny dependent calculi within urinary bladder. No definite calculi along the ureteral stent. No LEFT hydronephrosis or hydroureter. No solid renal masses or RIGHT hydronephrosis. Tiny BILATERAL nonobstructing renal calculi noted. Minimal LEFT perinephric edema. Minimal prostatic enlargement. Stomach/Bowel: Stomach and bowel loops normal appearance. Normal appendix. Vascular/Lymphatic: Circumaortic LEFT renal vein. Aorta normal caliber. No adenopathy. Reproductive: N/A Other: No free air or free fluid. Suspect prior BILATERAL inguinal hernia repairs without recurrent hernia. Musculoskeletal: Bones demineralized. IMPRESSION: Tiny BILATERAL nonobstructing renal calculi. LEFT ureteral stent without hydronephrosis or hydroureter. Probable tiny dependent calculi within urinary bladder without definite visualization of her ureteral calculus. BILATERAL renal cysts. Fatty infiltration of liver. Electronically Signed   By: Lavonia Dana M.D.   On: 11/04/2016 21:33    EKG: Orders placed or performed during the hospital encounter of 05/18/16  . EKG 12-Lead  . EKG 12-Lead  . ED EKG within 10 minutes  . ED EKG within 10 minutes  . EKG    IMPRESSION AND PLAN:  * Acute pyelonephritis   Recent kidney stone, status post lithotripsy, ureteral stent   IV ceftriaxone, follow urine culture.   Urology consult tomorrow morning.  * Intractable nausea and vomiting   Secondary to UTI   He received total 3 doses of Zofran IV but not helping much, will give one dose of phenergan.  * Posttraumatic stress disorder and depression   Continue home medications.  * COPD   No exacerbation symptoms, continue home inhalers.  All the records are reviewed and case discussed with ED provider. Management plans discussed with the patient, family and they are in agreement.  CODE STATUS: Full code. Code Status History    Date Active  Date Inactive Code Status Order ID Comments User Context   01/26/2016  6:52 PM 01/27/2016 12:40 PM Full Code [id number]  Robert Bellow, MD Inpatient     Patient's wife was present in the room during my visit.  TOTAL TIME TAKING CARE OF THIS PATIENT: 50 minutes.    Vaughan Basta M.D on 11/04/2016   Between 7am to 6pm - Pager - [phone]  After 6pm go to [email or web address] - password [email or web address] [facility]  Office  [phone]  CC: Primary care physician; Enid Derry, MD   Note: This dictation was prepared with Dragon dictation along with smaller phrase technology. Any transcriptional errors that result from this process are unintentional.

## 2016-11-04 NOTE — ED Provider Notes (Signed)
[facility] Emergency Department Provider Note  ____________________________________________   First MD Initiated Contact with Patient 11/04/16 1922     (approximate)  I have reviewed the triage vital signs and the nursing notes.   HISTORY  Chief Complaint Emesis    HPI Willie Crane is a 71 y.o. male who comes to the emergency department with nausea vomiting and abdominal pain that began [place] morning at 2 in the morning.He has a past medical history of multiple abdominal surgeries for hernias. He passed flatus this morning and had a bowel movement yesterday. One week ago he had a left sided lithotripsy and stent for kidney stone. He has had no dysuria since. He is felt feverish at home but did not check it. He got his flu shot this year.   Past Medical History:  Diagnosis Date  . Anxiety   . Asthma   . Benign paroxysmal positional vertigo   . BPH (benign prostatic hypertrophy)   . Complication of anesthesia   . COPD (chronic obstructive pulmonary disease) ([place])   . Coronary artery disease    history of heart attack  . Degeneration of lumbar or lumbosacral intervertebral disc   . History of kidney stones   . Hyperlipidemia   . Hypertension   . MI (myocardial infarction)    1980s  . Obesity, unspecified   . PONV (postoperative nausea and vomiting)   . PTSD (post-traumatic stress disorder)   . Renal disorder    kidney stones  . Thoracic or lumbosacral neuritis or radiculitis, unspecified   . Unspecified congenital anomaly of the integument   . Unspecified gastritis and gastroduodenitis without mention of hemorrhage     Patient Active Problem List   Diagnosis Date Noted  . Nephrolithiasis 09/28/2016  . Bilateral Simple Renal Cysts 09/28/2016  . History of acute myocardial infarction 09/27/2016  . Prolonged Q-T interval on ECG 09/27/2016  . Nausea after anesthesia 01/26/2016  . Obesity 12/27/2015  . Medication monitoring encounter  12/27/2015  . Prostate cancer screening   . Benign neoplasm of transverse colon   . Encounter for screening for malignant neoplasm of colon 10/21/2015  . Moderate persistent asthma 09/28/2015  . 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 Hydrocele, bilateral 07/12/2015  . Hyperlipidemia   . Hypertension     Past Surgical History:  Procedure Laterality Date  . BACK SURGERY    . CARDIAC CATHETERIZATION  03/2009   [facility]: No significant coronary artery disease  . CARDIAC CATHETERIZATION  01/2014   [facility]  . CARPAL TUNNEL RELEASE Right   . CHOLECYSTECTOMY    . COLONOSCOPY    . COLONOSCOPY WITH PROPOFOL N/A 10/31/2015   Procedure: COLONOSCOPY WITH PROPOFOL;  Surgeon: Lucilla Lame, MD;  Location: [place];  Service: Endoscopy;  Laterality: N/A;  . CYST EXCISION    . CYSTOSCOPY W/ URETERAL STENT PLACEMENT Left 10/26/2016   Procedure: CYSTOSCOPY WITH STENT REPLACEMENT;  Surgeon: Nickie Retort, MD;  Location: [facility];  Service: Urology;  Laterality: Left;  . CYSTOSCOPY WITH STENT PLACEMENT Left 10/05/2016   Procedure: CYSTOSCOPY WITH STENT PLACEMENT;  Surgeon: Nickie Retort, MD;  Location: [facility];  Service: Urology;  Laterality: Left;  . HERNIA REPAIR    . INGUINAL HERNIA REPAIR Right   . INGUINAL HERNIA REPAIR Left 01/26/2016   Procedure: HERNIA REPAIR INGUINAL ADULT;  Surgeon: Christene Lye, MD;  Location: [facility];  Service: General;  Laterality: Left;  . KNEE ARTHROSCOPY    .  LUMBAR LAMINECTOMY    . POLYPECTOMY  10/31/2015   Procedure: POLYPECTOMY;  Surgeon: Lucilla Lame, MD;  Location: [place];  Service: Endoscopy;;  . URETEROSCOPY Left 10/05/2016   Procedure: URETEROSCOPY;  Surgeon: Nickie Retort, MD;  Location: [facility];  Service: Urology;  Laterality: Left;  . URETEROSCOPY WITH HOLMIUM LASER LITHOTRIPSY Left 10/26/2016   Procedure: URETEROSCOPY WITH HOLMIUM LASER LITHOTRIPSY;  Surgeon: Nickie Retort, MD;  Location: [facility]  [facility];  Service: Urology;  Laterality: Left;  Marland Kitchen VASECTOMY      Prior to Admission medications   Medication Sig Start Date End Date Taking? Authorizing Provider  albuterol (PROVENTIL HFA) 108 (90 Base) MCG/ACT inhaler Inhale 2 puffs into the lungs every 4 (four) hours as needed for wheezing or shortness of breath. 08/13/16   Flora Lipps, MD  Bioflavonoid Products (VITAMIN C) CHEW Take 240 mgs once daily    Historical Provider, MD  budesonide-formoterol (SYMBICORT) 160-4.5 MCG/ACT inhaler Inhale 2 puffs into the lungs 2 (two) times daily. 08/13/16   Flora Lipps, MD  cephALEXin (KEFLEX) 500 MG capsule Take 1 capsule (500 mg total) by mouth 3 (three) times daily. 10/26/16   Nickie Retort, MD  diphenhydrAMINE (BENADRYL) 25 MG tablet Take 25 mg by mouth 2 (two) times daily as needed for allergies.    Historical Provider, MD  finasteride (PROSCAR) 5 MG tablet Take 1 tablet (5 mg total) by mouth daily. 07/10/16   Larene Beach A McGowan, PA-C  FLUoxetine (PROZAC) 20 MG capsule TAKE 1 CAPSULE (20 MG TOTAL) BY MOUTH DAILY. 06/30/16   Arnetha Courser, MD  Ketotifen Fumarate (ALLERGY EYE DROPS OP) Apply 1 drop to eye daily as needed (allergies).    Historical Provider, MD  Multiple Vitamins-Minerals (MULTI ADULT GUMMIES) CHEW Chew 1 tablet by mouth daily.    Historical Provider, MD  olmesartan (BENICAR) 20 MG tablet Take 1 tablet (20 mg total) by mouth daily. 07/02/16   Arnetha Courser, MD  ondansetron (ZOFRAN) 4 MG tablet Take 1 tablet (4 mg total) by mouth every 8 (eight) hours as needed for nausea or vomiting. 10/05/16   Nickie Retort, MD  oxyCODONE-acetaminophen (ROXICET) 5-325 MG tablet Take 1-2 tablets by mouth every 6 (six) hours as needed for severe pain. From kidney stone 10/26/16 10/26/17  Nickie Retort, MD  promethazine (PHENERGAN) 25 MG tablet Take 1 tablet (25 mg total) by mouth every 6 (six) hours as needed for nausea or vomiting. Patient not taking: Reported on 10/02/2016 09/27/16   Arnetha Courser, MD   sennosides-docusate sodium (SENOKOT-S) 8.6-50 MG tablet Take 2 tablets by mouth daily.    Historical Provider, MD  tamsulosin (FLOMAX) 0.4 MG CAPS capsule Take 1 capsule (0.4 mg total) by mouth daily. 10/08/16   Nickie Retort, MD    Allergies Other  Family History  Problem Relation Age of Onset  . Kidney failure Mother   . Cancer Mother   . Cancer Father     lung  . Prostate cancer Neg Hx     Social History Social History  Substance Use Topics  . Smoking status: Former Smoker    Packs/day: 1.00    Years: 20.00    Types: Cigarettes    Quit date: 01/19/1979  . Smokeless tobacco: Former Systems developer    Types: Chew     Comment: quit smoking 30 years  . Alcohol use 0.0 oz/week     Comment: wine 1x/mo    Review of Systems Constitutional: Positive fever Eyes:  No visual changes. ENT: No sore throat. Cardiovascular: Denies chest pain. Respiratory: Denies shortness of breath. Gastrointestinal: Positive abdominal pain.  Positive nausea, positive vomiting.  No diarrhea.  No constipation. Genitourinary: Negative for dysuria. Musculoskeletal: Negative for back pain. Skin: Negative for rash. Neurological: Negative for headaches, focal weakness or numbness.  10-point ROS otherwise negative.  ____________________________________________   PHYSICAL EXAM:  VITAL SIGNS: ED Triage Vitals  Enc Vitals Group     BP 11/04/16 1633 (!) 147/65     Pulse Rate 11/04/16 1633 (!) 102     Resp 11/04/16 1633 20     Temp 11/04/16 1631 98.5 F (36.9 C)     Temp Source 11/04/16 1631 Oral     SpO2 11/04/16 1633 97 %     Weight 11/04/16 1631 230 lb (104.3 kg)     Height 11/04/16 1631 5\' 7"  (1.702 m)     Head Circumference --      Peak Flow --      Pain Score 11/04/16 1631 0     Pain Loc --      Pain Edu? --      Excl. in [place]? --     Constitutional: Alert and oriented x 4 Appears uncomfortable and nauseated no respiratory distress Eyes: PERRL EOMI. Head: Atraumatic. Nose: No  congestion/rhinnorhea. Mouth/Throat: No trismus Neck: No stridor.   Cardiovascular: Normal rate, regular rhythm. Grossly normal heart sounds.  Good peripheral circulation. Respiratory: Normal respiratory effort.  No retractions. Lungs CTAB and moving good air Gastrointestinal: Soft nondistended quite tender in left lower quadrant greater than right lower quadrant negative Rovsing's positive rebound positive guarding no costovertebral tenderness Musculoskeletal: No lower extremity edema   Neurologic:  Normal speech and language. No gross focal neurologic deficits are appreciated. Skin:  Skin is warm, dry and intact. No rash noted. Psychiatric: Mood and affect are normal. Speech and behavior are normal.    ____________________________________________   DIFFERENTIAL  Influenza, appendicitis, diverticulitis, pyelonephritis ____________________________________________   LABS (all labs ordered are listed, but only abnormal results are displayed)  Labs Reviewed  COMPREHENSIVE METABOLIC PANEL - Abnormal; Notable for the following:       Result Value   Glucose, Bld 130 (*)    Total Bilirubin 1.4 (*)    All other components within normal limits  CBC - Abnormal; Notable for the following:    WBC 16.7 (*)    All other components within normal limits  URINALYSIS, COMPLETE (UACMP) WITH MICROSCOPIC - Abnormal; Notable for the following:    Color, Urine YELLOW (*)    APPearance HAZY (*)    Hgb urine dipstick MODERATE (*)    Protein, ur 30 (*)    Leukocytes, UA MODERATE (*)    Bacteria, UA FEW (*)    All other components within normal limits  URINE CULTURE  LIPASE, BLOOD  INFLUENZA PANEL BY PCR (TYPE A & B)    Urinalysis consistent with infection __________________________________________  EKG   ____________________________________________  RADIOLOGY  CT with no hydronephrosis ____________________________________________   PROCEDURES  Procedure(s) performed:  no  Procedures  Critical Care performed: yes  CRITICAL CARE Performed by: Darel Hong   Total critical care time: 30 minutes  Critical care time was exclusive of separately billable procedures and treating other patients.  Critical care was necessary to treat or prevent imminent or life-threatening deterioration.  Critical care was time spent personally by me on the following activities: development of treatment plan with patient and/or surrogate as well as nursing, discussions  with consultants, evaluation of patient's response to treatment, examination of patient, obtaining history from patient or surrogate, ordering and performing treatments and interventions, ordering and review of laboratory studies, ordering and review of radiographic studies, pulse oximetry and re-evaluation of patient's condition.   ____________________________________________   INITIAL IMPRESSION / ASSESSMENT AND PLAN / ED COURSE  Pertinent labs & imaging results that were available during my care of the patient were reviewed by me and considered in my medical decision making (see chart for details).  On arrival the patient is uncomfortable appearing with significant vomiting and abdominal tenderness. Fluids labs urinalysis pending    ----------------------------------------- 9:48 PM on 11/04/2016 -----------------------------------------  Fortunately the patient's CT scan is negative for acute pathology and his stent is widely patent with no hydronephrosis. His urine is consistent with infection and he has significant tenderness and vomiting. I will touch base with urology but he requires inpatient admission.  _----------------------------------------- 9:50 PM on 11/04/2016 -----------------------------------------  I discussed the case with Dr. Jeffie Pollock on call for Dr. Tommi Emery who agrees with IV antibiotics and requests consultation early tomorrow  morning.__________________________________________   FINAL CLINICAL IMPRESSION(S) / ED DIAGNOSES  Final diagnoses:  Pyelonephritis      NEW MEDICATIONS STARTED DURING THIS VISIT:  New Prescriptions   No medications on file     Note:  This document was prepared using Dragon voice recognition software and may include unintentional dictation errors.     Darel Hong, MD 11/04/16 [phone]

## 2016-11-04 NOTE — ED Notes (Signed)
RN assisted pt into bed. Family at bedside.

## 2016-11-04 NOTE — Progress Notes (Signed)
Pharmacy Antibiotic Note  Willie Crane is a 71 y.o. male admitted on 11/04/2016 with UTI.  Pharmacy has been consulted for Gentamicin dosing.  Plan: Will dose gentamicin 400 mg IV daily extended interval dosing based on AdjBW 81 kg. Will check a random gent level 4/2 @ 1000 and will assess based on nomogram.  Height: 5\' 7"  (170.2 cm) Weight: 230 lb (104.3 kg) IBW/kg (Calculated) : 66.1  Temp (24hrs), Avg:98.5 F (36.9 C), Min:98.5 F (36.9 C), Max:98.5 F (36.9 C)   Recent Labs Lab 11/04/16 1631  WBC 16.7*  CREATININE 1.06    Estimated Creatinine Clearance: 72.5 mL/min (by C-G formula based on SCr of 1.06 mg/dL).    Allergies  Allergen Reactions  . Other Swelling    Lips swell - Potatoes, Kiwi, carrots, celery (raw)  general anesthesia - nausea     Thank you for allowing pharmacy to be a part of this patient's care.  Tobie Lords, PharmD, BCPS Clinical Pharmacist 11/04/2016

## 2016-11-05 ENCOUNTER — Encounter: Payer: Self-pay | Admitting: *Deleted

## 2016-11-05 DIAGNOSIS — R110 Nausea: Secondary | ICD-10-CM

## 2016-11-05 DIAGNOSIS — R509 Fever, unspecified: Secondary | ICD-10-CM

## 2016-11-05 DIAGNOSIS — N12 Tubulo-interstitial nephritis, not specified as acute or chronic: Secondary | ICD-10-CM

## 2016-11-05 DIAGNOSIS — D72829 Elevated white blood cell count, unspecified: Secondary | ICD-10-CM

## 2016-11-05 DIAGNOSIS — N200 Calculus of kidney: Secondary | ICD-10-CM

## 2016-11-05 DIAGNOSIS — R000 Tachycardia, unspecified: Secondary | ICD-10-CM

## 2016-11-05 LAB — BASIC METABOLIC PANEL
Anion gap: 9 (ref 5–15)
BUN: 13 mg/dL (ref 6–20)
CO2: 22 mmol/L (ref 22–32)
CREATININE: 1.09 mg/dL (ref 0.61–1.24)
Calcium: 8.4 mg/dL — ABNORMAL LOW (ref 8.9–10.3)
Chloride: 106 mmol/L (ref 101–111)
Glucose, Bld: 123 mg/dL — ABNORMAL HIGH (ref 65–99)
Potassium: 3.4 mmol/L — ABNORMAL LOW (ref 3.5–5.1)
SODIUM: 137 mmol/L (ref 135–145)

## 2016-11-05 LAB — GENTAMICIN LEVEL, RANDOM: Gentamicin Rm: 0.9 ug/mL

## 2016-11-05 LAB — CBC
HEMATOCRIT: 41.6 % (ref 40.0–52.0)
Hemoglobin: 14.1 g/dL (ref 13.0–18.0)
MCH: 29.8 pg (ref 26.0–34.0)
MCHC: 33.9 g/dL (ref 32.0–36.0)
MCV: 87.8 fL (ref 80.0–100.0)
PLATELETS: 183 10*3/uL (ref 150–440)
RBC: 4.73 MIL/uL (ref 4.40–5.90)
RDW: 13.2 % (ref 11.5–14.5)
WBC: 14.6 10*3/uL — AB (ref 3.8–10.6)

## 2016-11-05 LAB — MAGNESIUM: Magnesium: 1.4 mg/dL — ABNORMAL LOW (ref 1.7–2.4)

## 2016-11-05 MED ORDER — DIPHENHYDRAMINE HCL 25 MG PO TABS
25.0000 mg | ORAL_TABLET | Freq: Two times a day (BID) | ORAL | Status: DC | PRN
  Filled 2016-11-05: qty 1

## 2016-11-05 MED ORDER — SODIUM CHLORIDE 0.9 % IV SOLN
30.0000 meq | Freq: Once | INTRAVENOUS | Status: DC
  Filled 2016-11-05: qty 15

## 2016-11-05 MED ORDER — FLUOXETINE HCL 20 MG PO CAPS
20.0000 mg | ORAL_CAPSULE | Freq: Every day | ORAL | Status: DC
  Administered 2016-11-05 – 2016-11-07 (×3): 20 mg via ORAL
  Filled 2016-11-05 (×3): qty 1

## 2016-11-05 MED ORDER — TAMSULOSIN HCL 0.4 MG PO CAPS
0.4000 mg | ORAL_CAPSULE | Freq: Every day | ORAL | Status: DC
  Administered 2016-11-05 – 2016-11-07 (×3): 0.4 mg via ORAL
  Filled 2016-11-05 (×3): qty 1

## 2016-11-05 MED ORDER — FINASTERIDE 5 MG PO TABS
5.0000 mg | ORAL_TABLET | Freq: Every day | ORAL | Status: DC
  Administered 2016-11-05 – 2016-11-07 (×3): 5 mg via ORAL
  Filled 2016-11-05 (×3): qty 1

## 2016-11-05 MED ORDER — OXYCODONE-ACETAMINOPHEN 5-325 MG PO TABS: 1.0000 | ORAL_TABLET | Freq: Four times a day (QID) | ORAL | Status: DC | PRN

## 2016-11-05 MED ORDER — POTASSIUM CHLORIDE 2 MEQ/ML IV SOLN
30.0000 meq | Freq: Once | INTRAVENOUS | Status: AC
  Administered 2016-11-05: 30 meq via INTRAVENOUS
  Filled 2016-11-05: qty 15

## 2016-11-05 MED ORDER — MAGNESIUM SULFATE 2 GM/50ML IV SOLN
2.0000 g | Freq: Once | INTRAVENOUS | Status: AC
  Administered 2016-11-05: 2 g via INTRAVENOUS
  Filled 2016-11-05: qty 50

## 2016-11-05 MED ORDER — HEPARIN SODIUM (PORCINE) 5000 UNIT/ML IJ SOLN
5000.0000 [IU] | Freq: Three times a day (TID) | INTRAMUSCULAR | Status: DC
  Administered 2016-11-05 – 2016-11-06 (×5): 5000 [IU] via SUBCUTANEOUS
  Filled 2016-11-05 (×6): qty 1

## 2016-11-05 MED ORDER — ONDANSETRON HCL 4 MG/2ML IJ SOLN
4.0000 mg | Freq: Four times a day (QID) | INTRAMUSCULAR | Status: DC | PRN
  Administered 2016-11-05 – 2016-11-06 (×4): 4 mg via INTRAVENOUS
  Filled 2016-11-05 (×4): qty 2

## 2016-11-05 MED ORDER — CEFTRIAXONE SODIUM 1 G IJ SOLR
1.0000 g | INTRAMUSCULAR | Status: DC
  Administered 2016-11-05: 1 g via INTRAVENOUS
  Filled 2016-11-05 (×2): qty 10

## 2016-11-05 MED ORDER — SODIUM CHLORIDE 0.9 % IV SOLN
INTRAVENOUS | Status: AC
  Administered 2016-11-05 (×2): via INTRAVENOUS

## 2016-11-05 MED ORDER — PROMETHAZINE HCL 25 MG/ML IJ SOLN
12.5000 mg | Freq: Four times a day (QID) | INTRAMUSCULAR | Status: DC | PRN
  Administered 2016-11-05 – 2016-11-06 (×4): 12.5 mg via INTRAVENOUS
  Filled 2016-11-05 (×4): qty 1

## 2016-11-05 MED ORDER — ALBUTEROL SULFATE (2.5 MG/3ML) 0.083% IN NEBU
2.5000 mg | INHALATION_SOLUTION | RESPIRATORY_TRACT | Status: DC | PRN
  Administered 2016-11-05: 2.5 mg via RESPIRATORY_TRACT
  Filled 2016-11-05: qty 3

## 2016-11-05 MED ORDER — METOCLOPRAMIDE HCL 5 MG/ML IJ SOLN
10.0000 mg | Freq: Once | INTRAMUSCULAR | Status: AC
  Administered 2016-11-05: 10 mg via INTRAVENOUS
  Filled 2016-11-05: qty 2

## 2016-11-05 MED ORDER — DOCUSATE SODIUM 100 MG PO CAPS: 100.0000 mg | ORAL_CAPSULE | Freq: Two times a day (BID) | ORAL | Status: DC | PRN

## 2016-11-05 MED ORDER — ONDANSETRON HCL 4 MG/2ML IJ SOLN
4.0000 mg | Freq: Once | INTRAMUSCULAR | Status: AC
  Administered 2016-11-05: 4 mg via INTRAVENOUS
  Filled 2016-11-05: qty 2

## 2016-11-05 MED ORDER — MULTI ADULT GUMMIES PO CHEW: 1.0000 | CHEWABLE_TABLET | Freq: Every day | ORAL | Status: DC

## 2016-11-05 MED ORDER — ADULT MULTIVITAMIN LIQUID CH
15.0000 mL | Freq: Every day | ORAL | Status: DC
  Administered 2016-11-05 – 2016-11-07 (×3): 15 mL via ORAL
  Filled 2016-11-05 (×3): qty 15

## 2016-11-05 MED ORDER — ONDANSETRON HCL 4 MG/2ML IJ SOLN
INTRAMUSCULAR | Status: AC
  Filled 2016-11-05: qty 2

## 2016-11-05 NOTE — Consult Note (Signed)
Reason for Consult:Nephrolithiasis, bilateral renal cysts, pyelonephritis  Referring Physician: Pricilla Larsson MD  Willie Crane is an 71 y.o. male.   HPI:   1 - Nephrolithiasis - s/p LEFT ureteroscopy / laser / stent exchange to stone free 10/31/16 by [facility] for left ureteral stone. CT 4/1 from ER this admission stone free and left stent in good position.  2 - Large Non-Complex Renal Cysts - very large right multifocal and small left renal cysts on imaging x many. No hydro / mass efect. NO enhancing nodules.   3 - Suspect Pyelonephritis - fevers, malaise, tachycardia, nauseas and leukocytosis 4/1 on ER eval. CT stone free and lef stetn in good positiion. UA inflamed as anticiapted, placed on empiric Gent. Most recent prior UCX 10/12/16 negative, New UCX 11/04/16 pending.  Today " Lucy " is seen in consultation for above. He was admitted yesterday PM for suspect pyelo few days after elective ureteroscopy. Most recent pre-ureteroscopy UCX negative. Luekocytosis this AM improving.   Past Medical History:  Diagnosis Date  . Anxiety   . Asthma   . Benign paroxysmal positional vertigo   . BPH (benign prostatic hypertrophy)   . Complication of anesthesia   . COPD (chronic obstructive pulmonary disease) ([place])   . Coronary artery disease    history of heart attack  . Degeneration of lumbar or lumbosacral intervertebral disc   . History of kidney stones   . Hyperlipidemia   . Hypertension   . MI (myocardial infarction)    1980s  . Obesity, unspecified   . PONV (postoperative nausea and vomiting)   . PTSD (post-traumatic stress disorder)   . Renal disorder    kidney stones  . Thoracic or lumbosacral neuritis or radiculitis, unspecified   . Unspecified congenital anomaly of the integument   . Unspecified gastritis and gastroduodenitis without mention of hemorrhage     Past Surgical History:  Procedure Laterality Date  . BACK SURGERY    . CARDIAC CATHETERIZATION  03/2009   [facility]:  No significant coronary artery disease  . CARDIAC CATHETERIZATION  01/2014   [facility]  . CARPAL TUNNEL RELEASE Right   . CHOLECYSTECTOMY    . COLONOSCOPY    . COLONOSCOPY WITH PROPOFOL N/A 10/31/2015   Procedure: COLONOSCOPY WITH PROPOFOL;  Surgeon: Lucilla Lame, MD;  Location: [place];  Service: Endoscopy;  Laterality: N/A;  . CYST EXCISION    . CYSTOSCOPY W/ URETERAL STENT PLACEMENT Left 10/26/2016   Procedure: CYSTOSCOPY WITH STENT REPLACEMENT;  Surgeon: Nickie Retort, MD;  Location: [facility];  Service: Urology;  Laterality: Left;  . CYSTOSCOPY WITH STENT PLACEMENT Left 10/05/2016   Procedure: CYSTOSCOPY WITH STENT PLACEMENT;  Surgeon: Nickie Retort, MD;  Location: [facility];  Service: Urology;  Laterality: Left;  . HERNIA REPAIR    . INGUINAL HERNIA REPAIR Right   . INGUINAL HERNIA REPAIR Left 01/26/2016   Procedure: HERNIA REPAIR INGUINAL ADULT;  Surgeon: Christene Lye, MD;  Location: [facility];  Service: General;  Laterality: Left;  . KNEE ARTHROSCOPY    . LUMBAR LAMINECTOMY    . POLYPECTOMY  10/31/2015   Procedure: POLYPECTOMY;  Surgeon: Lucilla Lame, MD;  Location: [place];  Service: Endoscopy;;  . URETEROSCOPY Left 10/05/2016   Procedure: URETEROSCOPY;  Surgeon: Nickie Retort, MD;  Location: [facility];  Service: Urology;  Laterality: Left;  . URETEROSCOPY WITH HOLMIUM LASER LITHOTRIPSY Left 10/26/2016   Procedure: URETEROSCOPY WITH HOLMIUM LASER LITHOTRIPSY;  Surgeon: Nickie Retort, MD;  Location: [facility];  Service: Urology;  Laterality: Left;  Marland Kitchen VASECTOMY      Family History  Problem Relation Age of Onset  . Kidney failure Mother   . Cancer Mother   . Cancer Father     lung  . Prostate cancer Neg Hx     Social History:  reports that he quit smoking about 37 years ago. His smoking use included Cigarettes. He has a 20.00 pack-year smoking history. He has quit using smokeless tobacco. His smokeless tobacco use included Chew. He reports  that he drinks alcohol. He reports that he does not use drugs.  Allergies:  Allergies  Allergen Reactions  . Other Swelling    Lips swell - Potatoes, Kiwi, carrots, celery (raw)  general anesthesia - nausea     Medications: I have reviewed the patient's current medications.  Results for orders placed or performed during the hospital encounter of 11/04/16 (from the past 48 hour(s))  Lipase, blood     Status: None   Collection Time: 11/04/16  4:31 PM  Result Value Ref Range   Lipase 14 11 - 51 U/L  Comprehensive metabolic panel     Status: Abnormal   Collection Time: 11/04/16  4:31 PM  Result Value Ref Range   Sodium 137 135 - 145 mmol/L   Potassium 3.5 3.5 - 5.1 mmol/L   Chloride 105 101 - 111 mmol/L   CO2 23 22 - 32 mmol/L   Glucose, Bld 130 (H) 65 - 99 mg/dL   BUN 15 6 - 20 mg/dL   Creatinine, Ser 1.06 0.61 - 1.24 mg/dL   Calcium 9.1 8.9 - 10.3 mg/dL   Total Protein 7.7 6.5 - 8.1 g/dL   Albumin 3.9 3.5 - 5.0 g/dL   AST 27 15 - 41 U/L   ALT 41 17 - 63 U/L   Alkaline Phosphatase 81 38 - 126 U/L   Total Bilirubin 1.4 (H) 0.3 - 1.2 mg/dL   GFR calc non Af Amer >60 >60 mL/min   GFR calc Af Amer >60 >60 mL/min    Comment: (NOTE) The eGFR has been calculated using the CKD EPI equation. This calculation has not been validated in all clinical situations. eGFR's persistently <60 mL/min signify possible Chronic Kidney Disease.    Anion gap 9 5 - 15  CBC     Status: Abnormal   Collection Time: 11/04/16  4:31 PM  Result Value Ref Range   WBC 16.7 (H) 3.8 - 10.6 K/uL   RBC 5.20 4.40 - 5.90 MIL/uL   Hemoglobin 15.6 13.0 - 18.0 g/dL   HCT 45.2 40.0 - 52.0 %   MCV 87.0 80.0 - 100.0 fL   MCH 30.0 26.0 - 34.0 pg   MCHC 34.5 32.0 - 36.0 g/dL   RDW 13.2 11.5 - 14.5 %   Platelets 206 150 - 440 K/uL  Influenza panel by PCR (type A & B)     Status: None   Collection Time: 11/04/16  7:34 PM  Result Value Ref Range   Influenza A By PCR NEGATIVE NEGATIVE   Influenza B By PCR  NEGATIVE NEGATIVE    Comment: (NOTE) The Xpert Xpress Flu assay is intended as an aid in the diagnosis of  influenza and should not be used as a sole basis for treatment.  This  assay is FDA approved for nasopharyngeal swab specimens only. Nasal  washings and aspirates are unacceptable for Xpert Xpress Flu testing.   Urinalysis, Complete w Microscopic  Status: Abnormal   Collection Time: 11/04/16  9:14 PM  Result Value Ref Range   Color, Urine YELLOW (A) YELLOW   APPearance HAZY (A) CLEAR   Specific Gravity, Urine 1.017 1.005 - 1.030   pH 6.0 5.0 - 8.0   Glucose, UA NEGATIVE NEGATIVE mg/dL   Hgb urine dipstick MODERATE (A) NEGATIVE   Bilirubin Urine NEGATIVE NEGATIVE   Ketones, ur NEGATIVE NEGATIVE mg/dL   Protein, ur 30 (A) NEGATIVE mg/dL   Nitrite NEGATIVE NEGATIVE   Leukocytes, UA MODERATE (A) NEGATIVE   RBC / HPF 6-30 0 - 5 RBC/hpf   WBC, UA TOO NUMEROUS TO COUNT 0 - 5 WBC/hpf   Bacteria, UA FEW (A) NONE SEEN   Squamous Epithelial / LPF NONE SEEN NONE SEEN   Mucous PRESENT   Basic metabolic panel     Status: Abnormal   Collection Time: 11/05/16  3:26 AM  Result Value Ref Range   Sodium 137 135 - 145 mmol/L   Potassium 3.4 (L) 3.5 - 5.1 mmol/L   Chloride 106 101 - 111 mmol/L   CO2 22 22 - 32 mmol/L   Glucose, Bld 123 (H) 65 - 99 mg/dL   BUN 13 6 - 20 mg/dL   Creatinine, Ser 1.09 0.61 - 1.24 mg/dL   Calcium 8.4 (L) 8.9 - 10.3 mg/dL   GFR calc non Af Amer >60 >60 mL/min   GFR calc Af Amer >60 >60 mL/min    Comment: (NOTE) The eGFR has been calculated using the CKD EPI equation. This calculation has not been validated in all clinical situations. eGFR's persistently <60 mL/min signify possible Chronic Kidney Disease.    Anion gap 9 5 - 15  CBC     Status: Abnormal   Collection Time: 11/05/16  3:26 AM  Result Value Ref Range   WBC 14.6 (H) 3.8 - 10.6 K/uL   RBC 4.73 4.40 - 5.90 MIL/uL   Hemoglobin 14.1 13.0 - 18.0 g/dL   HCT 41.6 40.0 - 52.0 %   MCV 87.8 80.0 -  100.0 fL   MCH 29.8 26.0 - 34.0 pg   MCHC 33.9 32.0 - 36.0 g/dL   RDW 13.2 11.5 - 14.5 %   Platelets 183 150 - 440 K/uL    Ct Abdomen Pelvis W Contrast  Result Date: 11/04/2016 CLINICAL DATA:  LEFT lower quadrant pain, vomiting for 2 days, cystoscopy with stent placement and lithotripsy 5 days ago, past history of COPD, asthma, hypertension, kidney stones, 3 hernia repairs, coronary disease post MI, cholecystectomy, former smoker EXAM: CT ABDOMEN AND PELVIS WITH CONTRAST TECHNIQUE: Multidetector CT imaging of the abdomen and pelvis was performed using the standard protocol following bolus administration of intravenous contrast. Sagittal and coronal MPR images reconstructed from axial data set. CONTRAST:  143m ISOVUE-300 IOPAMIDOL (ISOVUE-300) INJECTION 61% IV. Dilute oral contrast. COMPARISON:  09/26/2016 FINDINGS: Lower chest: Lung bases clear Hepatobiliary: Gallbladder surgically absent. Fatty infiltration of liver. No focal hepatic abnormalities or biliary dilatation Pancreas: Normal appearance Spleen: Normal appearance Adrenals/Urinary Tract: Adrenal glands normal appearance. BILATERAL renal cysts measuring up to 13.2 x 8.4 x 11.0 cm RIGHT and 6.6 x 5.9 x 6.5 cm LEFT. LEFT ureteral stent extends from renal pelvis into urinary bladder. Question tiny dependent calculi within urinary bladder. No definite calculi along the ureteral stent. No LEFT hydronephrosis or hydroureter. No solid renal masses or RIGHT hydronephrosis. Tiny BILATERAL nonobstructing renal calculi noted. Minimal LEFT perinephric edema. Minimal prostatic enlargement. Stomach/Bowel: Stomach and bowel loops normal appearance. Normal  appendix. Vascular/Lymphatic: Circumaortic LEFT renal vein. Aorta normal caliber. No adenopathy. Reproductive: N/A Other: No free air or free fluid. Suspect prior BILATERAL inguinal hernia repairs without recurrent hernia. Musculoskeletal: Bones demineralized. IMPRESSION: Tiny BILATERAL nonobstructing renal  calculi. LEFT ureteral stent without hydronephrosis or hydroureter. Probable tiny dependent calculi within urinary bladder without definite visualization of her ureteral calculus. BILATERAL renal cysts. Fatty infiltration of liver. Electronically Signed   By: Lavonia Dana M.D.   On: 11/04/2016 21:33    Review of Systems  Constitutional: Positive for fever and malaise/fatigue.  HENT: Negative.   Eyes: Negative.   Respiratory: Negative.   Cardiovascular: Negative.   Gastrointestinal: Positive for abdominal pain, nausea and vomiting.  Genitourinary: Positive for urgency.  Musculoskeletal: Negative.   Skin: Negative.   Neurological: Negative.   Endo/Heme/Allergies: Negative.   Psychiatric/Behavioral: Negative.    Blood pressure (!) 164/90, pulse (!) 107, temperature 99 F (37.2 C), temperature source Oral, resp. rate (!) 28, height _0  (1.753 m), weight 105.6 kg (232 lb 11.2 oz), SpO2 94 %. Physical Exam  Constitutional: He appears well-developed.  HENT:  Head: Normocephalic.  Eyes: Pupils are equal, round, and reactive to light.  Neck: Normal range of motion.  Cardiovascular: Normal rate.   Respiratory: Effort normal.  GI: Soft.  Significant truncal obesity w/o rebound / guarding.   Genitourinary: Penis normal.  Genitourinary Comments: No CVAT.   Musculoskeletal: Normal range of motion.  Neurological: He is alert.  Skin: Skin is warm.  Psychiatric: He has a normal mood and affect.    Assessment/Plan:  1 - Nephrolithiasis - now essentially stone free (only tiny puctate renal sotne that are liekly submucosal / not accessible) remain. Will reschedule office stent pull for about 10-14 days from now.   2 - Large Non-Complex Renal Cysts - no mass effect or findings c/w cancer. Observe.   3 - Suspect Pyelonephritis - agree with current ABX pending furhter CX data. No hydro or other indicaitons for procedural intervention. OK for DC from GU perspective when afebrile x 24 hrs and  prelim CX data avail to tailor transition to PO meds for approx 14 day total treatment course given fevers.   Please call with questions.   Azyiah Bo 11/05/2016, 6:44 AM

## 2016-11-05 NOTE — ED Notes (Signed)
Report called pt taken to floor by RN

## 2016-11-05 NOTE — Progress Notes (Signed)
MEDICATION RELATED CONSULT NOTE - INITIAL   Pharmacy Consult for electrolyte monitoring/supplementation Indication: hypokalemia  Allergies  Allergen Reactions  . Other Swelling    Lips swell - Potatoes, Kiwi, carrots, celery (raw)  general anesthesia - nausea     Patient Measurements: Height: 5\' 9"  (175.3 cm) Weight: 232 lb 11.2 oz (105.6 kg) IBW/kg (Calculated) : 70.7  Vital Signs: Temp: 98.8 F (37.1 C) (04/02 1149) Temp Source: Oral (04/02 1149) BP: 134/61 (04/02 1149) Pulse Rate: 94 (04/02 1149) Intake/Output from previous day: 04/01 0701 - 04/02 0700 In: 1000 [IV Piggyback:1000] Out: -  Intake/Output from this shift: Total I/O In: -  Out: 500 [Urine:500]  Labs:  Recent Labs  11/04/16 1631 11/05/16 0326  WBC 16.7* 14.6*  HGB 15.6 14.1  HCT 45.2 41.6  PLT 206 183  CREATININE 1.06 1.09  MG  --  1.4*  ALBUMIN 3.9  --   PROT 7.7  --   AST 27  --   ALT 41  --   ALKPHOS 81  --   BILITOT 1.4*  --    Estimated Creatinine Clearance: 73.4 mL/min (by C-G formula based on SCr of 1.09 mg/dL).   Assessment: Pharmacy consulted to monitor and replace electrolytes if needed in this 71 year old male. Patient is complaining of nausea today so will need to supplement with IV medications.   Mg = 1.4 K = 3.4  Goal of Therapy: Electrolytes WNL  Plan:  Magnesium 2 g IV once KCl 30 mEq IV once  Spoke with RN - reports patient is responding to ondansetron. Will recheck electrolytes tomorrow AM. Consider rechecking tonight if patient starts vomiting again.  Lenis Noon, PharmD Clinical Pharmacist 11/05/2016,1:49 PM

## 2016-11-05 NOTE — Progress Notes (Signed)
Dr Ara Kussmaul notified pt nauseated, not time for phenergan, orders given.

## 2016-11-05 NOTE — Plan of Care (Signed)
Problem: Nutrition: Goal: Adequate nutrition will be maintained Outcome: Not Progressing Pt is still experiencing a lot of nausea.

## 2016-11-05 NOTE — Progress Notes (Signed)
Patient with severe nausea per MD ordered promethazine and ondansetron PRN. Patient has possible h/o QT prolongation. MD wants to continue these medications as needed for now and get an EKG.   Ulice Dash, PharmD Clinical Pharmacist

## 2016-11-05 NOTE — Progress Notes (Signed)
Pt was in bathroom attempting to sit on toilet and pt missed toilet and sat on floor. VSS. No c/o pain anywhere. Pt was able to stand back up on his own. MD notified. No new orders.

## 2016-11-05 NOTE — Progress Notes (Signed)
Willie Crane at [place] NAME: Willie Crane    MR#:  [id number]  DATE OF BIRTH:  1946/06/05  SUBJECTIVE:  CHIEF COMPLAINT:  Patient is nauseous and anxious. Denies any abdominal pain. Wife at bedside  REVIEW OF SYSTEMS:  CONSTITUTIONAL: No fever, fatigue or weakness.  EYES: No blurred or double vision.  EARS, NOSE, AND THROAT: No tinnitus or ear pain.  RESPIRATORY: No cough, shortness of breath, wheezing or hemoptysis.  CARDIOVASCULAR: No chest pain, orthopnea, edema.  GASTROINTESTINAL:  Reporting nausea,  Denies vomiting, diarrhea or abdominal pain.  GENITOURINARY: No dysuria, hematuria.  ENDOCRINE: No polyuria, nocturia,  HEMATOLOGY: No anemia, easy bruising or bleeding SKIN: No rash or lesion. MUSCULOSKELETAL: No joint pain or arthritis.   NEUROLOGIC: No tingling, numbness, weakness.  PSYCHIATRY: No anxiety or depression.   DRUG ALLERGIES:   Allergies  Allergen Reactions  . Other Swelling    Lips swell - Potatoes, Kiwi, carrots, celery (raw)  general anesthesia - nausea     VITALS:  Blood pressure 134/61, pulse 94, temperature 98.8 F (37.1 C), temperature source Oral, resp. rate 18, height 5\' 9"  (1.753 m), weight 105.6 kg (232 lb 11.2 oz), SpO2 96 %.  PHYSICAL EXAMINATION:  GENERAL:  71 y.o.-year-old patient lying in the bed with no acute distress.  EYES: Pupils equal, round, reactive to light and accommodation. No scleral icterus. Extraocular muscles intact.  HEENT: Head atraumatic, normocephalic. Oropharynx and nasopharynx clear.  NECK:  Supple, no jugular venous distention. No thyroid enlargement, no tenderness.  LUNGS: Moderate  breath sounds bilaterally, no wheezing, rales,rhonchi or crepitation. No use of accessory muscles of respiration.  CARDIOVASCULAR: S1, S2 normal. No murmurs, rubs, or gallops.  ABDOMEN: Soft, nontender, nondistended. Bowel sounds present. No organomegaly or mass.  EXTREMITIES: No pedal  edema, cyanosis, or clubbing.  NEUROLOGIC: Cranial nerves II through XII are intact. Muscle strength 5/5 in all extremities. Sensation intact. Gait not checked.  PSYCHIATRIC: The patient is alert and oriented x 3.  SKIN: No obvious rash, lesion, or ulcer.    LABORATORY PANEL:   CBC  Recent Labs Lab 11/05/16 0326  WBC 14.6*  HGB 14.1  HCT 41.6  PLT 183   ------------------------------------------------------------------------------------------------------------------  Chemistries   Recent Labs Lab 11/04/16 1631 11/05/16 0326  NA 137 137  K 3.5 3.4*  CL 105 106  CO2 23 22  GLUCOSE 130* 123*  BUN 15 13  CREATININE 1.06 1.09  CALCIUM 9.1 8.4*  MG  --  1.4*  AST 27  --   ALT 41  --   ALKPHOS 81  --   BILITOT 1.4*  --    ------------------------------------------------------------------------------------------------------------------  Cardiac Enzymes No results for input(s): TROPONINI in the last 168 hours. ------------------------------------------------------------------------------------------------------------------  RADIOLOGY:  Ct Abdomen Pelvis W Contrast  Result Date: 11/04/2016 CLINICAL DATA:  LEFT lower quadrant pain, vomiting for 2 days, cystoscopy with stent placement and lithotripsy 5 days ago, past history of COPD, asthma, hypertension, kidney stones, 3 hernia repairs, coronary disease post MI, cholecystectomy, former smoker EXAM: CT ABDOMEN AND PELVIS WITH CONTRAST TECHNIQUE: Multidetector CT imaging of the abdomen and pelvis was performed using the standard protocol following bolus administration of intravenous contrast. Sagittal and coronal MPR images reconstructed from axial data set. CONTRAST:  160mL ISOVUE-300 IOPAMIDOL (ISOVUE-300) INJECTION 61% IV. Dilute oral contrast. COMPARISON:  09/26/2016 FINDINGS: Lower chest: Lung bases clear Hepatobiliary: Gallbladder surgically absent. Fatty infiltration of liver. No focal hepatic abnormalities or biliary  dilatation Pancreas: Normal  appearance Spleen: Normal appearance Adrenals/Urinary Tract: Adrenal glands normal appearance. BILATERAL renal cysts measuring up to 13.2 x 8.4 x 11.0 cm RIGHT and 6.6 x 5.9 x 6.5 cm LEFT. LEFT ureteral stent extends from renal pelvis into urinary bladder. Question tiny dependent calculi within urinary bladder. No definite calculi along the ureteral stent. No LEFT hydronephrosis or hydroureter. No solid renal masses or RIGHT hydronephrosis. Tiny BILATERAL nonobstructing renal calculi noted. Minimal LEFT perinephric edema. Minimal prostatic enlargement. Stomach/Bowel: Stomach and bowel loops normal appearance. Normal appendix. Vascular/Lymphatic: Circumaortic LEFT renal vein. Aorta normal caliber. No adenopathy. Reproductive: N/A Other: No free air or free fluid. Suspect prior BILATERAL inguinal hernia repairs without recurrent hernia. Musculoskeletal: Bones demineralized. IMPRESSION: Tiny BILATERAL nonobstructing renal calculi. LEFT ureteral stent without hydronephrosis or hydroureter. Probable tiny dependent calculi within urinary bladder without definite visualization of her ureteral calculus. BILATERAL renal cysts. Fatty infiltration of liver. Electronically Signed   By: Lavonia Dana M.D.   On: 11/04/2016 21:33    EKG:   Orders placed or performed during the hospital encounter of 11/04/16  . EKG 12-Lead  . EKG 12-Lead    ASSESSMENT AND PLAN:    * Acute pyelonephritis Nauseous today, antiemetics as needed IV fluids   Recent kidney stone, status post lithotripsy, ureteral stent   IV ceftriaxone, follow urine culture. Discontinue gentamicin   Urology consult pending.  CT abdomen with tiny bilateral nonobstructing renal calculi. Left ureteral stent without hydronephrosis or hydroureter  * Intractable nausea and vomiting   Secondary to UTI Alternate Zofran with IV Phenergan as needed    IV fluids  * Posttraumatic stress disorder and depression   Continue home  medications Prozac  * COPD-no exacerbation at this time Provide nebulizer treatments as needed       All the records are reviewed and case discussed with Care Management/Social Workerr. Management plans discussed with the patient, wife and they are in agreement.  CODE STATUS: fc   TOTAL TIME TAKING CARE OF THIS PATIENT: 35 minutes.   POSSIBLE D/C IN 2-3DAYS, DEPENDING ON CLINICAL CONDITION.  Note: This dictation was prepared with Dragon dictation along with smaller phrase technology. Any transcriptional errors that result from this process are unintentional.   Nicholes Mango M.D on 11/05/2016 at 3:20 PM  Between 7am to 6pm - Pager - [phone] After 6pm go to [email or web address] - password [email or web address] [facility]  Office  [phone]  CC: Primary care physician; Enid Derry, MD

## 2016-11-05 NOTE — Progress Notes (Signed)
Dr Ivette Loyal notified reglan not helping, orders given for zofran

## 2016-11-06 ENCOUNTER — Other Ambulatory Visit: Payer: Federal, State, Local not specified - PPO

## 2016-11-06 LAB — CBC WITH DIFFERENTIAL/PLATELET
Basophils Absolute: 0 10*3/uL (ref 0–0.1)
Basophils Relative: 0 %
Eosinophils Absolute: 0 10*3/uL (ref 0–0.7)
Eosinophils Relative: 1 %
HCT: 38.7 % — ABNORMAL LOW (ref 40.0–52.0)
HEMOGLOBIN: 13.4 g/dL (ref 13.0–18.0)
LYMPHS ABS: 0.9 10*3/uL — AB (ref 1.0–3.6)
Lymphocytes Relative: 10 %
MCH: 30.4 pg (ref 26.0–34.0)
MCHC: 34.5 g/dL (ref 32.0–36.0)
MCV: 87.9 fL (ref 80.0–100.0)
MONOS PCT: 13 %
Monocytes Absolute: 1.2 10*3/uL — ABNORMAL HIGH (ref 0.2–1.0)
NEUTROS ABS: 7.2 10*3/uL — AB (ref 1.4–6.5)
NEUTROS PCT: 76 %
PLATELETS: 147 10*3/uL — AB (ref 150–440)
RBC: 4.4 MIL/uL (ref 4.40–5.90)
RDW: 13.1 % (ref 11.5–14.5)
WBC: 9.4 10*3/uL (ref 3.8–10.6)

## 2016-11-06 LAB — BASIC METABOLIC PANEL
ANION GAP: 7 (ref 5–15)
BUN: 12 mg/dL (ref 6–20)
CHLORIDE: 109 mmol/L (ref 101–111)
CO2: 23 mmol/L (ref 22–32)
Calcium: 8.4 mg/dL — ABNORMAL LOW (ref 8.9–10.3)
Creatinine, Ser: 1.2 mg/dL (ref 0.61–1.24)
GFR calc Af Amer: >60 mL/min (ref >60)
GFR calc non Af Amer: 59 mL/min — ABNORMAL LOW (ref >60)
GLUCOSE: 117 mg/dL — AB (ref 65–99)
POTASSIUM: 3.3 mmol/L — AB (ref 3.5–5.1)
Sodium: 139 mmol/L (ref 135–145)

## 2016-11-06 LAB — MAGNESIUM: Magnesium: 2 mg/dL (ref 1.7–2.4)

## 2016-11-06 MED ORDER — SODIUM CHLORIDE 0.9 % IV SOLN
3.0000 g | Freq: Four times a day (QID) | INTRAVENOUS | Status: DC
  Administered 2016-11-06 – 2016-11-07 (×4): 3 g via INTRAVENOUS
  Filled 2016-11-06 (×6): qty 3

## 2016-11-06 MED ORDER — POTASSIUM CHLORIDE CRYS ER 20 MEQ PO TBCR
40.0000 meq | EXTENDED_RELEASE_TABLET | Freq: Once | ORAL | Status: AC
  Administered 2016-11-06: 40 meq via ORAL
  Filled 2016-11-06: qty 2

## 2016-11-06 NOTE — Progress Notes (Signed)
MEDICATION RELATED CONSULT NOTE - INITIAL   Pharmacy Consult for electrolyte monitoring/supplementation Indication: hypokalemia  Allergies  Allergen Reactions  . Other Swelling    Lips swell - Potatoes, Kiwi, carrots, celery (raw)  general anesthesia - nausea     Patient Measurements: Height: 5\' 9"  (175.3 cm) Weight: 232 lb 11.2 oz (105.6 kg) IBW/kg (Calculated) : 70.7  Vital Signs: Temp: 99.8 F (37.7 C) (04/03 1221) Temp Source: Oral (04/03 1221) BP: 151/81 (04/03 1257) Pulse Rate: 90 (04/03 1257) Intake/Output from previous day: 04/02 0701 - 04/03 0700 In: 1961.7 [P.O.:240; I.V.:1423.7; IV Piggyback:298] Out: 1750 [Urine:1750] Intake/Output from this shift: Total I/O In: 1402 [I.V.:1402] Out: 550 [Urine:550]  Labs:  Recent Labs  11/04/16 1631 11/05/16 0326 11/06/16 0454  WBC 16.7* 14.6* 9.4  HGB 15.6 14.1 13.4  HCT 45.2 41.6 38.7*  PLT 206 183 147*  CREATININE 1.06 1.09 1.20  MG  --  1.4* 2.0  ALBUMIN 3.9  --   --   PROT 7.7  --   --   AST 27  --   --   ALT 41  --   --   ALKPHOS 81  --   --   BILITOT 1.4*  --   --    Estimated Creatinine Clearance: 66.7 mL/min (by C-G formula based on SCr of 1.2 mg/dL).   Assessment: Pharmacy consulted to monitor and replace electrolytes if needed in this 71 year old male. Initially consulted for hypokalemia 2/2 N/V.  Mg = 2 K = 3.3  Goal of Therapy: Electrolytes WNL  Plan:  KCl 40 mEq PO once  Will recheck K/Mg with AM labs tomorrow. If N/V resolved, can likely sign off.   Lenis Noon, PharmD Clinical Pharmacist 11/06/2016,2:27 PM

## 2016-11-06 NOTE — Progress Notes (Signed)
Patient having frequency/urgency; getting up to bedside to use urinal; bed alarm from previous fall, being set off when patient getting up, every 20 mins; Patient becoming frustrated; refuses bed alarm, just to sit on side of bed; reviewed reason for alarm, precautions and staffing requirements; voiced understanding; continues to refuse. Barbaraann Faster, RN 4:48 AM 11/06/2016

## 2016-11-06 NOTE — Progress Notes (Signed)
[facility] at [place] NAME: Willie Crane    MR#:  [id number]  DATE OF BIRTH:  February 06, 1946  SUBJECTIVE:  CHIEF COMPLAINT:  Patients nausea is better Denies any abdominal pain.   REVIEW OF SYSTEMS:  CONSTITUTIONAL: No fever, fatigue or weakness.  EYES: No blurred or double vision.  EARS, NOSE, AND THROAT: No tinnitus or ear pain.  RESPIRATORY: No cough, shortness of breath, wheezing or hemoptysis.  CARDIOVASCULAR: No chest pain, orthopnea, edema.  GASTROINTESTINAL:  Reporting nausea,  Denies vomiting, diarrhea or abdominal pain.  GENITOURINARY: No dysuria, hematuria.  ENDOCRINE: No polyuria, nocturia,  HEMATOLOGY: No anemia, easy bruising or bleeding SKIN: No rash or lesion. MUSCULOSKELETAL: No joint pain or arthritis.   NEUROLOGIC: No tingling, numbness, weakness.  PSYCHIATRY: No anxiety or depression.   DRUG ALLERGIES:   Allergies  Allergen Reactions  . Other Swelling    Lips swell - Potatoes, Kiwi, carrots, celery (raw)  general anesthesia - nausea     VITALS:  Blood pressure (!) 151/81, pulse 90, temperature 99.8 F (37.7 C), temperature source Oral, resp. rate 18, height 5\' 9"  (1.753 m), weight 105.6 kg (232 lb 11.2 oz), SpO2 97 %.  PHYSICAL EXAMINATION:  GENERAL:  71 y.o.-year-old patient lying in the bed with no acute distress.  EYES: Pupils equal, round, reactive to light and accommodation. No scleral icterus. Extraocular muscles intact.  HEENT: Head atraumatic, normocephalic. Oropharynx and nasopharynx clear.  NECK:  Supple, no jugular venous distention. No thyroid enlargement, no tenderness.  LUNGS: Moderate  breath sounds bilaterally, no wheezing, rales,rhonchi or crepitation. No use of accessory muscles of respiration.  CARDIOVASCULAR: S1, S2 normal. No murmurs, rubs, or gallops.  ABDOMEN: Soft, nontender, nondistended. Bowel sounds present. No organomegaly or mass.  EXTREMITIES: No pedal edema, cyanosis, or  clubbing.  NEUROLOGIC: Cranial nerves II through XII are intact. Muscle strength 5/5 in all extremities. Sensation intact. Gait not checked.  PSYCHIATRIC: The patient is alert and oriented x 3.  SKIN: No obvious rash, lesion, or ulcer.    LABORATORY PANEL:   CBC  Recent Labs Lab 11/06/16 0454  WBC 9.4  HGB 13.4  HCT 38.7*  PLT 147*   ------------------------------------------------------------------------------------------------------------------  Chemistries   Recent Labs Lab 11/04/16 1631  11/06/16 0454  NA 137  < > 139  K 3.5  < > 3.3*  CL 105  < > 109  CO2 23  < > 23  GLUCOSE 130*  < > 117*  BUN 15  < > 12  CREATININE 1.06  < > 1.20  CALCIUM 9.1  < > 8.4*  MG  --   < > 2.0  AST 27  --   --   ALT 41  --   --   ALKPHOS 81  --   --   BILITOT 1.4*  --   --   < > = values in this interval not displayed. ------------------------------------------------------------------------------------------------------------------  Cardiac Enzymes No results for input(s): TROPONINI in the last 168 hours. ------------------------------------------------------------------------------------------------------------------  RADIOLOGY:  Ct Abdomen Pelvis W Contrast  Result Date: 11/04/2016 CLINICAL DATA:  LEFT lower quadrant pain, vomiting for 2 days, cystoscopy with stent placement and lithotripsy 5 days ago, past history of COPD, asthma, hypertension, kidney stones, 3 hernia repairs, coronary disease post MI, cholecystectomy, former smoker EXAM: CT ABDOMEN AND PELVIS WITH CONTRAST TECHNIQUE: Multidetector CT imaging of the abdomen and pelvis was performed using the standard protocol following bolus administration of intravenous contrast. Sagittal and coronal  MPR images reconstructed from axial data set. CONTRAST:  156mL ISOVUE-300 IOPAMIDOL (ISOVUE-300) INJECTION 61% IV. Dilute oral contrast. COMPARISON:  09/26/2016 FINDINGS: Lower chest: Lung bases clear Hepatobiliary: Gallbladder  surgically absent. Fatty infiltration of liver. No focal hepatic abnormalities or biliary dilatation Pancreas: Normal appearance Spleen: Normal appearance Adrenals/Urinary Tract: Adrenal glands normal appearance. BILATERAL renal cysts measuring up to 13.2 x 8.4 x 11.0 cm RIGHT and 6.6 x 5.9 x 6.5 cm LEFT. LEFT ureteral stent extends from renal pelvis into urinary bladder. Question tiny dependent calculi within urinary bladder. No definite calculi along the ureteral stent. No LEFT hydronephrosis or hydroureter. No solid renal masses or RIGHT hydronephrosis. Tiny BILATERAL nonobstructing renal calculi noted. Minimal LEFT perinephric edema. Minimal prostatic enlargement. Stomach/Bowel: Stomach and bowel loops normal appearance. Normal appendix. Vascular/Lymphatic: Circumaortic LEFT renal vein. Aorta normal caliber. No adenopathy. Reproductive: N/A Other: No free air or free fluid. Suspect prior BILATERAL inguinal hernia repairs without recurrent hernia. Musculoskeletal: Bones demineralized. IMPRESSION: Tiny BILATERAL nonobstructing renal calculi. LEFT ureteral stent without hydronephrosis or hydroureter. Probable tiny dependent calculi within urinary bladder without definite visualization of her ureteral calculus. BILATERAL renal cysts. Fatty infiltration of liver. Electronically Signed   By: Lavonia Dana M.D.   On: 11/04/2016 21:33    EKG:   Orders placed or performed during the hospital encounter of 11/04/16  . EKG 12-Lead  . EKG 12-Lead    ASSESSMENT AND PLAN:    * Acute pyelonephritis Nauseous today, antiemetics as needed IV fluids   Recent kidney stone, status post lithotripsy, ureteral stent   IV ceftriaxone changed to Unasyn  follow urine culture with enterococcus. Discontinue gentamicin   Urology recommended op stent removal in 10-14 days  CT abdomen with tiny bilateral nonobstructing renal calculi. Left ureteral stent without hydronephrosis or hydroureter  * Intractable nausea and  vomiting   Secondary to UTI Alternate Zofran with IV Phenergan as needed    IV fluids  * Posttraumatic stress disorder and depression   Continue home medications Prozac  * COPD-no exacerbation at this time Provide nebulizer treatments as needed       All the records are reviewed and case discussed with Care Management/Social Workerr. Management plans discussed with the patient, wife and they are in agreement.  CODE STATUS: fc   TOTAL TIME TAKING CARE OF THIS PATIENT: 35 minutes.   POSSIBLE D/C IN  Am DAYS, DEPENDING ON CLINICAL CONDITION.  Note: This dictation was prepared with Dragon dictation along with smaller phrase technology. Any transcriptional errors that result from this process are unintentional.   Nicholes Mango M.D on 11/06/2016 at 7:54 PM  Between 7am to 6pm - Pager - [phone] After 6pm go to [email or web address] - password [email or web address] [facility]  Office  [phone]  CC: Primary care physician; Enid Derry, MD

## 2016-11-07 LAB — URINE CULTURE

## 2016-11-07 LAB — CBC WITH DIFFERENTIAL/PLATELET
BASOS PCT: 0 %
Basophils Absolute: 0 10*3/uL (ref 0–0.1)
EOS ABS: 0.1 10*3/uL (ref 0–0.7)
EOS PCT: 1 %
HCT: 39 % — ABNORMAL LOW (ref 40.0–52.0)
HEMOGLOBIN: 13.6 g/dL (ref 13.0–18.0)
LYMPHS ABS: 1.2 10*3/uL (ref 1.0–3.6)
Lymphocytes Relative: 13 %
MCH: 30.6 pg (ref 26.0–34.0)
MCHC: 34.8 g/dL (ref 32.0–36.0)
MCV: 87.8 fL (ref 80.0–100.0)
Monocytes Absolute: 1.3 10*3/uL — ABNORMAL HIGH (ref 0.2–1.0)
Monocytes Relative: 15 %
NEUTROS PCT: 71 %
Neutro Abs: 6.2 10*3/uL (ref 1.4–6.5)
PLATELETS: 160 10*3/uL (ref 150–440)
RBC: 4.44 MIL/uL (ref 4.40–5.90)
RDW: 13.1 % (ref 11.5–14.5)
WBC: 8.8 10*3/uL (ref 3.8–10.6)

## 2016-11-07 LAB — POTASSIUM: POTASSIUM: 3.4 mmol/L — AB (ref 3.5–5.1)

## 2016-11-07 LAB — STONE ANALYSIS
CA PHOS CRY STONE QL IR: 5 %
Ca Oxalate,Monohydr.: 95 %

## 2016-11-07 LAB — MAGNESIUM: MAGNESIUM: 1.7 mg/dL (ref 1.7–2.4)

## 2016-11-07 MED ORDER — POTASSIUM CHLORIDE CRYS ER 20 MEQ PO TBCR
40.0000 meq | EXTENDED_RELEASE_TABLET | Freq: Once | ORAL | Status: DC
  Filled 2016-11-07 (×2): qty 2

## 2016-11-07 MED ORDER — POTASSIUM CHLORIDE CRYS ER 10 MEQ PO TBCR
30.0000 meq | EXTENDED_RELEASE_TABLET | Freq: Once | ORAL | Status: AC
  Administered 2016-11-07: 30 meq via ORAL
  Filled 2016-11-07: qty 1

## 2016-11-07 MED ORDER — ONDANSETRON HCL 4 MG PO TABS: 4.0000 mg | ORAL_TABLET | Freq: Three times a day (TID) | ORAL | 0 refills | Status: DC | PRN

## 2016-11-07 MED ORDER — MAGNESIUM OXIDE 250 MG PO TABS: 1.0000 | ORAL_TABLET | Freq: Every day | ORAL | 0 refills | Status: DC

## 2016-11-07 MED ORDER — ACETAMINOPHEN 325 MG PO TABS: 650.0000 mg | ORAL_TABLET | Freq: Four times a day (QID) | ORAL | Status: DC | PRN

## 2016-11-07 MED ORDER — AMOXICILLIN-POT CLAVULANATE 500-125 MG PO TABS: 1.0000 | ORAL_TABLET | Freq: Three times a day (TID) | ORAL | 0 refills | Status: DC

## 2016-11-07 MED ORDER — MAGNESIUM SULFATE 2 GM/50ML IV SOLN
2.0000 g | Freq: Once | INTRAVENOUS | Status: AC
  Administered 2016-11-07: 2 g via INTRAVENOUS
  Filled 2016-11-07: qty 50

## 2016-11-07 MED ORDER — MAGNESIUM SULFATE BOLUS VIA INFUSION: 1.0000 g | Freq: Once | INTRAVENOUS | Status: DC

## 2016-11-07 MED ORDER — MAGNESIUM SULFATE IN D5W 1-5 GM/100ML-% IV SOLN
1.0000 g | Freq: Once | INTRAVENOUS | Status: DC
  Filled 2016-11-07 (×2): qty 100

## 2016-11-07 NOTE — Discharge Summary (Signed)
[facility] at [place] NAME: Willie Crane    MR#:  [id number]  DATE OF BIRTH:  [birth year]  DATE OF ADMISSION:  11/04/2016 ADMITTING PHYSICIAN: Vaughan Basta, MD  DATE OF DISCHARGE: 11/07/16 PRIMARY CARE PHYSICIAN: Enid Derry, MD    ADMISSION DIAGNOSIS:  Pyelonephritis [N12]  DISCHARGE DIAGNOSIS:  Principal Problem:   Acute pyelonephritis Active Problems:   Pyelonephritis   SECONDARY DIAGNOSIS:   Past Medical History:  Diagnosis Date  . Anxiety   . Asthma   . Benign paroxysmal positional vertigo   . BPH (benign prostatic hypertrophy)   . Complication of anesthesia   . COPD (chronic obstructive pulmonary disease) ([place])   . Coronary artery disease    history of heart attack  . Degeneration of lumbar or lumbosacral intervertebral disc   . History of kidney stones   . Hyperlipidemia   . Hypertension   . MI (myocardial infarction)    1980s  . Obesity, unspecified   . PONV (postoperative nausea and vomiting)   . PTSD (post-traumatic stress disorder)   . Renal disorder    kidney stones  . Thoracic or lumbosacral neuritis or radiculitis, unspecified   . Unspecified congenital anomaly of the integument   . Unspecified gastritis and gastroduodenitis without mention of hemorrhage     HOSPITAL COURSE:   HISTORY OF PRESENT ILLNESS: Willie Crane  is a 71 y.o. male with a known history of Anxiety, benign prostatic hypertrophy, paroxysmal positional vertigo, COPD, coronary artery disease, kidney stone, hyperlipidemia, hypertension, posttraumatic stress disorder- had kidney stone and laser guided lithotripsy was done last week and the ureteral stent was placed in urologist scheduled to come for stent removal next week. Since today at 2 AM he started having excessive nausea and vomiting and not able to keep any food down so came to emergency room. He is noted to have UTI and so given to hospitalist team for further  management   * Acute pyelonephritis Feeling much better Nausea resolved, antiemetics as needed IV fluids Recent kidney stone, status post lithotripsy, ureteral stent, urology has recommended the patient to follow up with them as an outpatient in 10-14 days for stent removal IV ceftriaxone changed to Unasyn  follow urine culture with enterococcus. Discontinue gentamicin Discontinue Unasyn and discharge patient with by mouth Augmentin for another 12 days  CT abdomen with tiny bilateral nonobstructing renal calculi. Left ureteral stent without hydronephrosis or hydroureter  * Intractable nausea and vomiting Secondary to UTI, resolved Alternate Zofran with IV Phenergan as needed. Will discharge patient home with zofran  IV fluids  * Posttraumatic stress disorder and depression Continue home medications Prozac  * COPD-no exacerbation at this time Continue inhalers as needed  Hypo kalemia and hypomagnesemia Repleted    DISCHARGE CONDITIONS:  Fair   CONSULTS OBTAINED:  Treatment Team:  Alexis Frock, MD   PROCEDURES none   DRUG ALLERGIES:   Allergies  Allergen Reactions  . Other Swelling    Lips swell - Potatoes, Kiwi, carrots, celery (raw)  general anesthesia - nausea     DISCHARGE MEDICATIONS:   Current Discharge Medication List    START taking these medications   Details  acetaminophen (TYLENOL) 325 MG tablet Take 2 tablets (650 mg total) by mouth every 6 (six) hours as needed for mild pain or fever.    amoxicillin-clavulanate (AUGMENTIN) 500-125 MG tablet Take 1 tablet (500 mg total) by mouth 3 (three) times daily. Qty: 36 tablet, Refills: 0  Magnesium Oxide 250 MG TABS Take 1 tablet (250 mg total) by mouth daily. Qty: 15 tablet, Refills: 0      CONTINUE these medications which have CHANGED   Details  ondansetron (ZOFRAN) 4 MG tablet Take 1 tablet (4 mg total) by mouth every 8 (eight) hours as needed for nausea or vomiting. Qty: 15  tablet, Refills: 0   Associated Diagnoses: Nausea      CONTINUE these medications which have NOT CHANGED   Details  albuterol (PROVENTIL HFA) 108 (90 Base) MCG/ACT inhaler Inhale 2 puffs into the lungs every 4 (four) hours as needed for wheezing or shortness of breath. Qty: 3 Inhaler, Refills: 1    Bioflavonoid Products (VITAMIN C) CHEW Take 240 mgs once daily    budesonide-formoterol (SYMBICORT) 160-4.5 MCG/ACT inhaler Inhale 2 puffs into the lungs 2 (two) times daily. Qty: 3 Inhaler, Refills: 3    finasteride (PROSCAR) 5 MG tablet Take 1 tablet (5 mg total) by mouth daily. Qty: 90 tablet, Refills: 3    FLUoxetine (PROZAC) 20 MG capsule TAKE 1 CAPSULE (20 MG TOTAL) BY MOUTH DAILY. Qty: 90 capsule, Refills: 1    Multiple Vitamins-Minerals (MULTI ADULT GUMMIES) CHEW Chew 1 tablet by mouth daily.    olmesartan (BENICAR) 20 MG tablet Take 1 tablet (20 mg total) by mouth daily. Qty: 90 tablet, Refills: 1    diphenhydrAMINE (BENADRYL) 25 MG tablet Take 25 mg by mouth 2 (two) times daily as needed for allergies.    Ketotifen Fumarate (ALLERGY EYE DROPS OP) Apply 1 drop to eye daily as needed (allergies).    oxyCODONE-acetaminophen (ROXICET) 5-325 MG tablet Take 1-2 tablets by mouth every 6 (six) hours as needed for severe pain. From kidney stone Qty: 30 tablet, Refills: 0   Associated Diagnoses: Nephrolithiasis    sennosides-docusate sodium (SENOKOT-S) 8.6-50 MG tablet Take 2 tablets by mouth daily.    tamsulosin (FLOMAX) 0.4 MG CAPS capsule Take 1 capsule (0.4 mg total) by mouth daily. Qty: 30 capsule, Refills: 0   Associated Diagnoses: Left ureteral stone      STOP taking these medications     cephALEXin (KEFLEX) 500 MG capsule      promethazine (PHENERGAN) 25 MG tablet          DISCHARGE INSTRUCTIONS:   Follow-up with primary care physician in a week Follow-up with urology in 10 days for stent removal   DIET:  Regular diet  DISCHARGE CONDITION:   Stable  ACTIVITY:  Activity as tolerated  OXYGEN:  Home Oxygen: No.   Oxygen Delivery: room air  DISCHARGE LOCATION:  home   If you experience worsening of your admission symptoms, develop shortness of breath, life threatening emergency, suicidal or homicidal thoughts you must seek medical attention immediately by calling 911 or calling your MD immediately  if symptoms less severe.  You Must read complete instructions/literature along with all the possible adverse reactions/side effects for all the Medicines you take and that have been prescribed to you. Take any new Medicines after you have completely understood and accpet all the possible adverse reactions/side effects.   Please note  You were cared for by a hospitalist during your hospital stay. If you have any questions about your discharge medications or the care you received while you were in the hospital after you are discharged, you can call the unit and asked to speak with the hospitalist on call if the hospitalist that took care of you is not available. Once you are discharged, your primary care  physician will handle any further medical issues. Please note that NO REFILLS for any discharge medications will be authorized once you are discharged, as it is imperative that you return to your primary care physician (or establish a relationship with a primary care physician if you do not have one) for your aftercare needs so that they can reassess your need for medications and monitor your lab values.     Today  Chief Complaint  Patient presents with  . Emesis   Patient is feeling much better. Nausea resolved. Wants to go home  ROS:  CONSTITUTIONAL: Denies fevers, chills. Denies any fatigue, weakness.  EYES: Denies blurry vision, double vision, eye pain. EARS, NOSE, THROAT: Denies tinnitus, ear pain, hearing loss. RESPIRATORY: Denies cough, wheeze, shortness of breath.  CARDIOVASCULAR: Denies chest pain, palpitations, edema.   GASTROINTESTINAL: Denies nausea, vomiting, diarrhea, abdominal pain. Denies bright red blood per rectum. GENITOURINARY: Denies dysuria, hematuria. ENDOCRINE: Denies nocturia or thyroid problems. HEMATOLOGIC AND LYMPHATIC: Denies easy bruising or bleeding. SKIN: Denies rash or lesion. MUSCULOSKELETAL: Denies pain in neck, back, shoulder, knees, hips or arthritic symptoms.  NEUROLOGIC: Denies paralysis, paresthesias.  PSYCHIATRIC: Denies anxiety or depressive symptoms.   VITAL SIGNS:  Blood pressure 133/77, pulse 83, temperature 99.2 F (37.3 C), temperature source Oral, resp. rate 20, height 5\' 9"  (1.753 m), weight 105.6 kg (232 lb 11.2 oz), SpO2 98 %.  I/O:    Intake/Output Summary (Last 24 hours) at 11/07/16 1419 Last data filed at 11/07/16 1200  Gross per 24 hour  Intake              480 ml  Output              700 ml  Net             -220 ml    PHYSICAL EXAMINATION:  GENERAL:  71 y.o.-year-old patient lying in the bed with no acute distress.  EYES: Pupils equal, round, reactive to light and accommodation. No scleral icterus. Extraocular muscles intact.  HEENT: Head atraumatic, normocephalic. Oropharynx and nasopharynx clear.  NECK:  Supple, no jugular venous distention. No thyroid enlargement, no tenderness.  LUNGS: Normal breath sounds bilaterally, no wheezing, rales,rhonchi or crepitation. No use of accessory muscles of respiration.  CARDIOVASCULAR: S1, S2 normal. No murmurs, rubs, or gallops.  ABDOMEN: Soft, non-tender, non-distended. Bowel sounds present. No organomegaly or mass.  EXTREMITIES: No pedal edema, cyanosis, or clubbing.  NEUROLOGIC: Cranial nerves II through XII are intact. Muscle strength 5/5 in all extremities. Sensation intact. Gait not checked.  PSYCHIATRIC: The patient is alert and oriented x 3.  SKIN: No obvious rash, lesion, or ulcer.   DATA REVIEW:   CBC  Recent Labs Lab 11/07/16 0546  WBC 8.8  HGB 13.6  HCT 39.0*  PLT 160    Chemistries    Recent Labs Lab 11/04/16 1631  11/06/16 0454 11/07/16 0546  NA 137  < > 139  --   K 3.5  < > 3.3* 3.4*  CL 105  < > 109  --   CO2 23  < > 23  --   GLUCOSE 130*  < > 117*  --   BUN 15  < > 12  --   CREATININE 1.06  < > 1.20  --   CALCIUM 9.1  < > 8.4*  --   MG  --   < > 2.0 1.7  AST 27  --   --   --   ALT 41  --   --   --  ALKPHOS 81  --   --   --   BILITOT 1.4*  --   --   --   < > = values in this interval not displayed.  Cardiac Enzymes No results for input(s): TROPONINI in the last 168 hours.  Microbiology Results  Results for orders placed or performed during the hospital encounter of 11/04/16  Urine culture     Status: Abnormal   Collection Time: 11/04/16  9:14 PM  Result Value Ref Range Status   Specimen Description URINE, RANDOM  Final   Special Requests NONE  Final   Culture >=100,000 COLONIES/mL ENTEROCOCCUS FAECALIS (A)  Final   Report Status 11/07/2016 FINAL  Final   Organism ID, Bacteria ENTEROCOCCUS FAECALIS (A)  Final      Susceptibility   Enterococcus faecalis - MIC*    AMPICILLIN <=2 SENSITIVE Sensitive     LEVOFLOXACIN 1 SENSITIVE Sensitive     NITROFURANTOIN <=16 SENSITIVE Sensitive     VANCOMYCIN 2 SENSITIVE Sensitive     * >=100,000 COLONIES/mL ENTEROCOCCUS FAECALIS    RADIOLOGY:  Ct Abdomen Pelvis W Contrast  Result Date: 11/04/2016 CLINICAL DATA:  LEFT lower quadrant pain, vomiting for 2 days, cystoscopy with stent placement and lithotripsy 5 days ago, past history of COPD, asthma, hypertension, kidney stones, 3 hernia repairs, coronary disease post MI, cholecystectomy, former smoker EXAM: CT ABDOMEN AND PELVIS WITH CONTRAST TECHNIQUE: Multidetector CT imaging of the abdomen and pelvis was performed using the standard protocol following bolus administration of intravenous contrast. Sagittal and coronal MPR images reconstructed from axial data set. CONTRAST:  129mL ISOVUE-300 IOPAMIDOL (ISOVUE-300) INJECTION 61% IV. Dilute oral contrast.  COMPARISON:  09/26/2016 FINDINGS: Lower chest: Lung bases clear Hepatobiliary: Gallbladder surgically absent. Fatty infiltration of liver. No focal hepatic abnormalities or biliary dilatation Pancreas: Normal appearance Spleen: Normal appearance Adrenals/Urinary Tract: Adrenal glands normal appearance. BILATERAL renal cysts measuring up to 13.2 x 8.4 x 11.0 cm RIGHT and 6.6 x 5.9 x 6.5 cm LEFT. LEFT ureteral stent extends from renal pelvis into urinary bladder. Question tiny dependent calculi within urinary bladder. No definite calculi along the ureteral stent. No LEFT hydronephrosis or hydroureter. No solid renal masses or RIGHT hydronephrosis. Tiny BILATERAL nonobstructing renal calculi noted. Minimal LEFT perinephric edema. Minimal prostatic enlargement. Stomach/Bowel: Stomach and bowel loops normal appearance. Normal appendix. Vascular/Lymphatic: Circumaortic LEFT renal vein. Aorta normal caliber. No adenopathy. Reproductive: N/A Other: No free air or free fluid. Suspect prior BILATERAL inguinal hernia repairs without recurrent hernia. Musculoskeletal: Bones demineralized. IMPRESSION: Tiny BILATERAL nonobstructing renal calculi. LEFT ureteral stent without hydronephrosis or hydroureter. Probable tiny dependent calculi within urinary bladder without definite visualization of her ureteral calculus. BILATERAL renal cysts. Fatty infiltration of liver. Electronically Signed   By: Lavonia Dana M.D.   On: 11/04/2016 21:33    EKG:   Orders placed or performed during the hospital encounter of 11/04/16  . EKG 12-Lead  . EKG 12-Lead      Management plans discussed with the patient, family and they are in agreement.  CODE STATUS:     Code Status Orders        Start     Ordered   11/05/16 0030  Full code  Continuous     11/05/16 0029    Code Status History    Date Active Date Inactive Code Status Order ID Comments User Context   01/26/2016  6:52 PM 01/27/2016 12:40 PM Full Code [id number]  Robert Bellow, MD Inpatient    Advance Directive  Documentation     Most Recent Value  Type of Advance Directive  Living will  Pre-existing out of facility DNR order (yellow form or pink MOST form)  -  "MOST" Form in Place?  -      TOTAL TIME TAKING CARE OF THIS PATIENT: 45 minutes.   Note: This dictation was prepared with Dragon dictation along with smaller phrase technology. Any transcriptional errors that result from this process are unintentional.   @[facility] @  on 11/07/2016 at 2:19 PM  Between 7am to 6pm - Pager - [phone]  After 6pm go to [email or web address] - password [email or web address] [facility]  Office  [phone]  CC: Primary care physician; Enid Derry, MD

## 2016-11-07 NOTE — Progress Notes (Signed)
MEDICATION RELATED CONSULT NOTE - INITIAL   Pharmacy Consult for electrolyte monitoring/supplementation Indication: hypokalemia  Allergies  Allergen Reactions  . Other Swelling    Lips swell - Potatoes, Kiwi, carrots, celery (raw)  general anesthesia - nausea     Patient Measurements: Height: 5\' 9"  (175.3 cm) Weight: 232 lb 11.2 oz (105.6 kg) IBW/kg (Calculated) : 70.7  Vital Signs: Temp: 99.3 F (37.4 C) (04/04 0547) Temp Source: Oral (04/04 0547) BP: 127/65 (04/04 0547) Pulse Rate: 82 (04/04 0547) Intake/Output from previous day: 04/03 0701 - 04/04 0700 In: 1882 [P.O.:480; I.V.:1402] Out: 1050 [Urine:1050] Intake/Output from this shift: No intake/output data recorded.  Labs:  Recent Labs  11/04/16 1631 11/05/16 0326 11/06/16 0454 11/07/16 0546  WBC 16.7* 14.6* 9.4 8.8  HGB 15.6 14.1 13.4 13.6  HCT 45.2 41.6 38.7* 39.0*  PLT 206 183 147* 160  CREATININE 1.06 1.09 1.20  --   MG  --  1.4* 2.0 1.7  ALBUMIN 3.9  --   --   --   PROT 7.7  --   --   --   AST 27  --   --   --   ALT 41  --   --   --   ALKPHOS 81  --   --   --   BILITOT 1.4*  --   --   --    Estimated Creatinine Clearance: 66.7 mL/min (by C-G formula based on SCr of 1.2 mg/dL).   Assessment: Pharmacy consulted to monitor and replace electrolytes if needed in this 71 year old male. Initially consulted for hypokalemia 2/2 N/V.  Mg = 1.7 K = 3.4  Goal of Therapy: Electrolytes WNL  Plan:  Will order Mg 2gm IV x 1 dose and KCL 66mEq PO x 1 dose   Will recheck K/Mg with AM labs tomorrow. If N/V resolved, can likely sign off.   Pernell Dupre, PharmD, BCPS  Clinical Pharmacist 11/07/2016,7:49 AM

## 2016-11-07 NOTE — Progress Notes (Signed)
IV was removed. Discharge instructions, follow-up appointments, and prescriptions were provided to the pt and wife at bedside. The pt was taken downstairs via wheelchair by volunteer services.

## 2016-11-07 NOTE — Discharge Instructions (Signed)
Follow-up with primary care physician in a week Follow-up with urology in 10 days for stent removal

## 2016-11-16 ENCOUNTER — Encounter: Payer: Self-pay | Admitting: Family Medicine

## 2016-11-16 ENCOUNTER — Ambulatory Visit (INDEPENDENT_AMBULATORY_CARE_PROVIDER_SITE_OTHER): Payer: Federal, State, Local not specified - PPO | Admitting: Family Medicine

## 2016-11-16 DIAGNOSIS — E876 Hypokalemia: Secondary | ICD-10-CM

## 2016-11-16 DIAGNOSIS — N200 Calculus of kidney: Secondary | ICD-10-CM

## 2016-11-16 DIAGNOSIS — N12 Tubulo-interstitial nephritis, not specified as acute or chronic: Secondary | ICD-10-CM

## 2016-11-16 MED ORDER — MAGNESIUM OXIDE 250 MG PO TABS: 0.5000 | ORAL_TABLET | Freq: Every day | ORAL | 0 refills | Status: DC

## 2016-11-16 NOTE — Assessment & Plan Note (Signed)
Encouraged hydration

## 2016-11-16 NOTE — Patient Instructions (Addendum)
Try to get in more potassium-rich foods Use the half pill of magnesium daily Recheck labs in 4 weeks or so (magnesium and potassium in particular) Really try to hydrate to the point that your urine is either very pale yellow or clear Return in 4 weeks for labs and PCV-13 (pneumonia vaccine)

## 2016-11-16 NOTE — Assessment & Plan Note (Signed)
Reviewed hospital labs; patient declined recheck here today; he'll try to increase intake of K+ rich foods; recheck in 4 weeks

## 2016-11-16 NOTE — Assessment & Plan Note (Signed)
Reviewed hospital record; f/u with urologist next week; patient currently asymptomatic

## 2016-11-16 NOTE — Assessment & Plan Note (Signed)
Normal Cr; will have him take just 125 mg of magnesium daily; he did not want to recheck labs today after all the recent sticks; will bring him back for recheck Mg2+ in 4 weeks

## 2016-11-16 NOTE — Progress Notes (Signed)
BP 138/88   Pulse 84   Temp 97.9 F (36.6 C) (Oral)   Resp 16   Wt 230 lb 12.8 oz (104.7 kg)   SpO2 95%   BMI 34.08 kg/m    Subjective:    Patient ID: Willie Crane, male    DOB: 12-Aug-1945, 71 y.o.   MRN: [id number]  HPI: Willie Crane is a 71 y.o. male  Chief Complaint  Patient presents with  . Hospitalization Follow-up    Acute pyelonephritis    HPI  Patient is here for hospital f/u; he had pyelonephritis Notes reviewed Has a stent in place and follows up with urologist next week to have that removed No fevers Reviewed hospital labs Mg2+ was low and they replaced it while he was there K+ was low; no diarhrea from the antibiotics Urine showed significant infection He is aware he has more stones still in the kidney He is probably not drinking enough fluids  Depression screen [facility] 2/9 11/16/2016 09/27/2016 12/26/2015 10/21/2015 10/21/2015  Decreased Interest 0 0 0 0 0  Down, Depressed, Hopeless 0 0 0 0 0  PHQ - 2 Score 0 0 0 0 0    Relevant past medical, surgical, family and social history reviewed Past Medical History:  Diagnosis Date  . Anxiety   . Asthma   . Benign paroxysmal positional vertigo   . BPH (benign prostatic hypertrophy)   . Complication of anesthesia   . COPD (chronic obstructive pulmonary disease) ([place])   . Coronary artery disease    history of heart attack  . Degeneration of lumbar or lumbosacral intervertebral disc   . History of kidney stones   . Hyperlipidemia   . Hypertension   . MI (myocardial infarction)    1980s  . Obesity, unspecified   . PONV (postoperative nausea and vomiting)   . PTSD (post-traumatic stress disorder)   . Renal disorder    kidney stones  . Thoracic or lumbosacral neuritis or radiculitis, unspecified   . Unspecified congenital anomaly of the integument   . Unspecified gastritis and gastroduodenitis without mention of hemorrhage    Past Surgical History:  Procedure Laterality Date  . BACK SURGERY    .  CARDIAC CATHETERIZATION  03/2009   [facility]: No significant coronary artery disease  . CARDIAC CATHETERIZATION  01/2014   [facility]  . CARPAL TUNNEL RELEASE Right   . CHOLECYSTECTOMY    . COLONOSCOPY    . COLONOSCOPY WITH PROPOFOL N/A 10/31/2015   Procedure: COLONOSCOPY WITH PROPOFOL;  Surgeon: Lucilla Lame, MD;  Location: [place];  Service: Endoscopy;  Laterality: N/A;  . CYST EXCISION    . CYSTOSCOPY W/ URETERAL STENT PLACEMENT Left 10/26/2016   Procedure: CYSTOSCOPY WITH STENT REPLACEMENT;  Surgeon: Nickie Retort, MD;  Location: [facility];  Service: Urology;  Laterality: Left;  . CYSTOSCOPY WITH STENT PLACEMENT Left 10/05/2016   Procedure: CYSTOSCOPY WITH STENT PLACEMENT;  Surgeon: Nickie Retort, MD;  Location: [facility];  Service: Urology;  Laterality: Left;  . HERNIA REPAIR    . INGUINAL HERNIA REPAIR Right   . INGUINAL HERNIA REPAIR Left 01/26/2016   Procedure: HERNIA REPAIR INGUINAL ADULT;  Surgeon: Christene Lye, MD;  Location: [facility];  Service: General;  Laterality: Left;  . KNEE ARTHROSCOPY    . LUMBAR LAMINECTOMY    . POLYPECTOMY  10/31/2015   Procedure: POLYPECTOMY;  Surgeon: Lucilla Lame, MD;  Location: [place];  Service: Endoscopy;;  . URETEROSCOPY Left 10/05/2016  Procedure: URETEROSCOPY;  Surgeon: Nickie Retort, MD;  Location: [facility];  Service: Urology;  Laterality: Left;  . URETEROSCOPY WITH HOLMIUM LASER LITHOTRIPSY Left 10/26/2016   Procedure: URETEROSCOPY WITH HOLMIUM LASER LITHOTRIPSY;  Surgeon: Nickie Retort, MD;  Location: [facility];  Service: Urology;  Laterality: Left;  Marland Kitchen VASECTOMY     Family History  Problem Relation Age of Onset  . Kidney failure Mother   . Cancer Mother   . Cancer Father     lung  . Prostate cancer Neg Hx    Social History  Substance Use Topics  . Smoking status: Former Smoker    Packs/day: 1.00    Years: 20.00    Types: Cigarettes    Quit date: 01/19/1979  . Smokeless tobacco: Former Systems developer     Types: Chew     Comment: quit smoking 30 years  . Alcohol use 0.0 oz/week     Comment: wine 1x/mo    Interim medical history since last visit reviewed. Allergies and medications reviewed  Review of Systems Per HPI unless specifically indicated above     Objective:    BP 138/88   Pulse 84   Temp 97.9 F (36.6 C) (Oral)   Resp 16   Wt 230 lb 12.8 oz (104.7 kg)   SpO2 95%   BMI 34.08 kg/m   Wt Readings from Last 3 Encounters:  11/16/16 230 lb 12.8 oz (104.7 kg)  11/05/16 232 lb 11.2 oz (105.6 kg)  10/26/16 230 lb (104.3 kg)    Physical Exam  Constitutional: He appears well-developed and well-nourished. No distress.  obese  Eyes: No scleral icterus.  Cardiovascular: Normal rate and regular rhythm.   Pulmonary/Chest: Effort normal and breath sounds normal.  Abdominal: Bowel sounds are normal. He exhibits no distension.  Neurological: He is alert.  Skin: He is not diaphoretic. No pallor.  Psychiatric: He has a normal mood and affect.    Results for orders placed or performed during the hospital encounter of 11/04/16  Urine culture  Result Value Ref Range   Specimen Description URINE, RANDOM    Special Requests NONE    Culture >=100,000 COLONIES/mL ENTEROCOCCUS FAECALIS (A)    Report Status 11/07/2016 FINAL    Organism ID, Bacteria ENTEROCOCCUS FAECALIS (A)       Susceptibility   Enterococcus faecalis - MIC*    AMPICILLIN <=2 SENSITIVE Sensitive     LEVOFLOXACIN 1 SENSITIVE Sensitive     NITROFURANTOIN <=16 SENSITIVE Sensitive     VANCOMYCIN 2 SENSITIVE Sensitive     * >=100,000 COLONIES/mL ENTEROCOCCUS FAECALIS  Lipase, blood  Result Value Ref Range   Lipase 14 11 - 51 U/L  Comprehensive metabolic panel  Result Value Ref Range   Sodium 137 135 - 145 mmol/L   Potassium 3.5 3.5 - 5.1 mmol/L   Chloride 105 101 - 111 mmol/L   CO2 23 22 - 32 mmol/L   Glucose, Bld 130 (H) 65 - 99 mg/dL   BUN 15 6 - 20 mg/dL   Creatinine, Ser 1.06 0.61 - 1.24 mg/dL   Calcium 9.1  8.9 - 10.3 mg/dL   Total Protein 7.7 6.5 - 8.1 g/dL   Albumin 3.9 3.5 - 5.0 g/dL   AST 27 15 - 41 U/L   ALT 41 17 - 63 U/L   Alkaline Phosphatase 81 38 - 126 U/L   Total Bilirubin 1.4 (H) 0.3 - 1.2 mg/dL   GFR calc non Af Amer >60 >60 mL/min   GFR  calc Af Amer >60 >60 mL/min   Anion gap 9 5 - 15  CBC  Result Value Ref Range   WBC 16.7 (H) 3.8 - 10.6 K/uL   RBC 5.20 4.40 - 5.90 MIL/uL   Hemoglobin 15.6 13.0 - 18.0 g/dL   HCT 45.2 40.0 - 52.0 %   MCV 87.0 80.0 - 100.0 fL   MCH 30.0 26.0 - 34.0 pg   MCHC 34.5 32.0 - 36.0 g/dL   RDW 13.2 11.5 - 14.5 %   Platelets 206 150 - 440 K/uL  Urinalysis, Complete w Microscopic  Result Value Ref Range   Color, Urine YELLOW (A) YELLOW   APPearance HAZY (A) CLEAR   Specific Gravity, Urine 1.017 1.005 - 1.030   pH 6.0 5.0 - 8.0   Glucose, UA NEGATIVE NEGATIVE mg/dL   Hgb urine dipstick MODERATE (A) NEGATIVE   Bilirubin Urine NEGATIVE NEGATIVE   Ketones, ur NEGATIVE NEGATIVE mg/dL   Protein, ur 30 (A) NEGATIVE mg/dL   Nitrite NEGATIVE NEGATIVE   Leukocytes, UA MODERATE (A) NEGATIVE   RBC / HPF 6-30 0 - 5 RBC/hpf   WBC, UA TOO NUMEROUS TO COUNT 0 - 5 WBC/hpf   Bacteria, UA FEW (A) NONE SEEN   Squamous Epithelial / LPF NONE SEEN NONE SEEN   Mucous PRESENT   Influenza panel by PCR (type A & B)  Result Value Ref Range   Influenza A By PCR NEGATIVE NEGATIVE   Influenza B By PCR NEGATIVE NEGATIVE  Gentamicin level, random  Result Value Ref Range   Gentamicin Rm 0.9 ug/mL  Basic metabolic panel  Result Value Ref Range   Sodium 137 135 - 145 mmol/L   Potassium 3.4 (L) 3.5 - 5.1 mmol/L   Chloride 106 101 - 111 mmol/L   CO2 22 22 - 32 mmol/L   Glucose, Bld 123 (H) 65 - 99 mg/dL   BUN 13 6 - 20 mg/dL   Creatinine, Ser 1.09 0.61 - 1.24 mg/dL   Calcium 8.4 (L) 8.9 - 10.3 mg/dL   GFR calc non Af Amer >60 >60 mL/min   GFR calc Af Amer >60 >60 mL/min   Anion gap 9 5 - 15  CBC  Result Value Ref Range   WBC 14.6 (H) 3.8 - 10.6 K/uL   RBC  4.73 4.40 - 5.90 MIL/uL   Hemoglobin 14.1 13.0 - 18.0 g/dL   HCT 41.6 40.0 - 52.0 %   MCV 87.8 80.0 - 100.0 fL   MCH 29.8 26.0 - 34.0 pg   MCHC 33.9 32.0 - 36.0 g/dL   RDW 13.2 11.5 - 14.5 %   Platelets 183 150 - 440 K/uL  Magnesium  Result Value Ref Range   Magnesium 1.4 (L) 1.7 - 2.4 mg/dL  Basic metabolic panel  Result Value Ref Range   Sodium 139 135 - 145 mmol/L   Potassium 3.3 (L) 3.5 - 5.1 mmol/L   Chloride 109 101 - 111 mmol/L   CO2 23 22 - 32 mmol/L   Glucose, Bld 117 (H) 65 - 99 mg/dL   BUN 12 6 - 20 mg/dL   Creatinine, Ser 1.20 0.61 - 1.24 mg/dL   Calcium 8.4 (L) 8.9 - 10.3 mg/dL   GFR calc non Af Amer 59 (L) >60 mL/min   GFR calc Af Amer >60 >60 mL/min   Anion gap 7 5 - 15  Magnesium  Result Value Ref Range   Magnesium 2.0 1.7 - 2.4 mg/dL  CBC with Differential/Platelet  Result  Value Ref Range   WBC 9.4 3.8 - 10.6 K/uL   RBC 4.40 4.40 - 5.90 MIL/uL   Hemoglobin 13.4 13.0 - 18.0 g/dL   HCT 38.7 (L) 40.0 - 52.0 %   MCV 87.9 80.0 - 100.0 fL   MCH 30.4 26.0 - 34.0 pg   MCHC 34.5 32.0 - 36.0 g/dL   RDW 13.1 11.5 - 14.5 %   Platelets 147 (L) 150 - 440 K/uL   Neutrophils Relative % 76 %   Neutro Abs 7.2 (H) 1.4 - 6.5 K/uL   Lymphocytes Relative 10 %   Lymphs Abs 0.9 (L) 1.0 - 3.6 K/uL   Monocytes Relative 13 %   Monocytes Absolute 1.2 (H) 0.2 - 1.0 K/uL   Eosinophils Relative 1 %   Eosinophils Absolute 0.0 0 - 0.7 K/uL   Basophils Relative 0 %   Basophils Absolute 0.0 0 - 0.1 K/uL  CBC with Differential/Platelet  Result Value Ref Range   WBC 8.8 3.8 - 10.6 K/uL   RBC 4.44 4.40 - 5.90 MIL/uL   Hemoglobin 13.6 13.0 - 18.0 g/dL   HCT 39.0 (L) 40.0 - 52.0 %   MCV 87.8 80.0 - 100.0 fL   MCH 30.6 26.0 - 34.0 pg   MCHC 34.8 32.0 - 36.0 g/dL   RDW 13.1 11.5 - 14.5 %   Platelets 160 150 - 440 K/uL   Neutrophils Relative % 71 %   Neutro Abs 6.2 1.4 - 6.5 K/uL   Lymphocytes Relative 13 %   Lymphs Abs 1.2 1.0 - 3.6 K/uL   Monocytes Relative 15 %   Monocytes  Absolute 1.3 (H) 0.2 - 1.0 K/uL   Eosinophils Relative 1 %   Eosinophils Absolute 0.1 0 - 0.7 K/uL   Basophils Relative 0 %   Basophils Absolute 0.0 0 - 0.1 K/uL  Potassium  Result Value Ref Range   Potassium 3.4 (L) 3.5 - 5.1 mmol/L  Magnesium  Result Value Ref Range   Magnesium 1.7 1.7 - 2.4 mg/dL      Assessment & Plan:   Problem List Items Addressed This Visit      Genitourinary   Pyelonephritis    Reviewed hospital record; f/u with urologist next week; patient currently asymptomatic      Nephrolithiasis    Encouraged hydration        Other   Hypomagnesemia    Normal Cr; will have him take just 125 mg of magnesium daily; he did not want to recheck labs today after all the recent sticks; will bring him back for recheck Mg2+ in 4 weeks      Hypokalemia    Reviewed hospital labs; patient declined recheck here today; he'll try to increase intake of K+ rich foods; recheck in 4 weeks          Follow up plan: No Follow-up on file.  An after-visit summary was printed and given to the patient at [place].  Please see the patient instructions which may contain other information and recommendations beyond what is mentioned above in the assessment and plan.  Meds ordered this encounter  Medications  . Magnesium Oxide 250 MG TABS    Sig: Take 0.5 tablets (125 mg total) by mouth daily.    Dispense:  15 tablet    Refill:  0    No orders of the defined types were placed in this encounter.

## 2016-11-21 ENCOUNTER — Ambulatory Visit (INDEPENDENT_AMBULATORY_CARE_PROVIDER_SITE_OTHER): Payer: Federal, State, Local not specified - PPO | Admitting: Urology

## 2016-11-21 VITALS — BP 156/102 | HR 78 | Ht 69.0 in

## 2016-11-21 DIAGNOSIS — N200 Calculus of kidney: Secondary | ICD-10-CM

## 2016-11-21 LAB — URINALYSIS, COMPLETE
Bilirubin, UA: NEGATIVE
Glucose, UA: NEGATIVE
Ketones, UA: NEGATIVE
Nitrite, UA: NEGATIVE
Specific Gravity, UA: 1.02 (ref 1.005–1.030)
Urobilinogen, Ur: 0.2 mg/dL (ref 0.2–1.0)
pH, UA: 5.5 (ref 5.0–7.5)

## 2016-11-21 LAB — MICROSCOPIC EXAMINATION: RBC, UA: >30 /hpf — ABNORMAL HIGH (ref 0–?)

## 2016-11-21 MED ORDER — CIPROFLOXACIN HCL 500 MG PO TABS
500.0000 mg | ORAL_TABLET | Freq: Once | ORAL | Status: AC
  Administered 2016-11-21: 500 mg via ORAL

## 2016-11-21 MED ORDER — LIDOCAINE HCL 2 % EX GEL
1.0000 | Freq: Once | CUTANEOUS | Status: AC
Start: 2016-11-21 — End: 2016-11-21
  Administered 2016-11-21: 1 via URETHRAL

## 2016-11-21 NOTE — Progress Notes (Signed)
   11/21/16  CC: No chief complaint on file.   HPI:  1 - Nephrolithiasis - s/p LEFT ureteroscopy / laser / stent exchange to stone free 10/31/16 for left ureteral stone. CT 4/1 from ER this admission stone free and left stent in good position. Admitted a week post op for pyelonephritis. Enterococcus in urine. Patient doing better now. Here for stent removal  2 - Large Non-Complex Renal Cysts - very large right multifocal and small left renal cysts on imaging x many. No hydro / mass efect. NO enhancing nodules.    There were no vitals taken for this visit. NED. A&Ox3.   No respiratory distress   Abd soft, NT, ND Normal phallus with bilateral descended testicles  Cystoscopy Procedure Note  Patient identification was confirmed, informed consent was obtained, and patient was prepped using Betadine solution.  Lidocaine jelly was administered per urethral meatus.    Preoperative abx where received prior to procedure.     Pre-Procedure: - Inspection reveals a normal caliber ureteral meatus.  Procedure: The flexible cystoscope was introduced without difficulty - No urethral strictures/lesions are present. - Left ureteral stent removed intact with flexible graspers per meatus.   Post-Procedure: - Patient tolerated the procedure well  Assessment/ Plan:  1. History of nephrolithiasis The patient will follow-up in one month with renal ultrasound prior to rule out iatrogenic hydronephrosis

## 2016-12-11 ENCOUNTER — Ambulatory Visit
Admission: RE | Admit: 2016-12-11 | Discharge: 2016-12-11 | Disposition: A | Discharge status: Home / Self Care | Payer: Federal, State, Local not specified - PPO | Source: Ambulatory Visit | Attending: Urology | Admitting: Urology

## 2016-12-11 DIAGNOSIS — N200 Calculus of kidney: Secondary | ICD-10-CM

## 2016-12-11 DIAGNOSIS — N281 Cyst of kidney, acquired: Secondary | ICD-10-CM | POA: Diagnosis not present

## 2016-12-14 ENCOUNTER — Ambulatory Visit: Payer: Federal, State, Local not specified - PPO | Admitting: Family Medicine

## 2016-12-17 ENCOUNTER — Other Ambulatory Visit: Payer: Self-pay | Admitting: Family Medicine

## 2016-12-17 MED ORDER — MAGNESIUM OXIDE 250 MG PO TABS: 0.5000 | ORAL_TABLET | Freq: Every day | ORAL | 0 refills | Status: DC

## 2016-12-21 ENCOUNTER — Encounter: Payer: Self-pay | Admitting: Urology

## 2016-12-21 ENCOUNTER — Ambulatory Visit (INDEPENDENT_AMBULATORY_CARE_PROVIDER_SITE_OTHER): Payer: Federal, State, Local not specified - PPO | Admitting: Urology

## 2016-12-21 DIAGNOSIS — N200 Calculus of kidney: Secondary | ICD-10-CM

## 2016-12-21 NOTE — Progress Notes (Signed)
12/21/2016 4:07 PM   Willie Crane Mar 23, 1946 [id number]  Referring provider: Arnetha Courser, MD [address] [place], [place] [address]  Chief Complaint  Patient presents with  . Nephrolithiasis    HPI: The patient is a 71 year old gentleman that presents today for follow-up.  1 - Nephrolithiasis- s/p LEFT ureteroscopy / laser / stent exchange to stone free 10/31/16 for left ureteral stone. CT 4/1 from ER this admission stone free and left stent in good position. Admitted a week post op for pyelonephritis. Enterococcus in urine. Presents today for post-instrumentation renal ultrasound is unremarkable. Stone was 95% calcium oxalate monohydrate and 5% calcium phosphate.  2 - Large Non-Complex Renal Cysts- very large right multifocal and small left renal cysts on imaging x many. No hydro / mass efect. NO enhancing nodules.      PMH: Past Medical History:  Diagnosis Date  . Anxiety   . Asthma   . Benign paroxysmal positional vertigo   . BPH (benign prostatic hypertrophy)   . Complication of anesthesia   . COPD (chronic obstructive pulmonary disease) ([place])   . Coronary artery disease    history of heart attack  . Degeneration of lumbar or lumbosacral intervertebral disc   . History of kidney stones   . Hyperlipidemia   . Hypertension   . MI (myocardial infarction) ([place])    1980s  . Obesity, unspecified   . PONV (postoperative nausea and vomiting)   . PTSD (post-traumatic stress disorder)   . Renal disorder    kidney stones  . Thoracic or lumbosacral neuritis or radiculitis, unspecified   . Unspecified congenital anomaly of the integument   . Unspecified gastritis and gastroduodenitis without mention of hemorrhage     Surgical History: Past Surgical History:  Procedure Laterality Date  . BACK SURGERY    . CARDIAC CATHETERIZATION  03/2009   [facility]: No significant coronary artery disease  . [facility]IZATION  [facility]  . CARPAL TUNNEL  RELEASE Right   . CHOLECYSTECTOMY    . COLONOSCOPY    . COLONOSCOPY WITH PROPOFOL N/A 10/31/2015   Procedure: COLONOSCOPY WITH PROPOFOL;  Surgeon: Lucilla Lame, MD;  Location: [place] [facility];  Service: Endoscopy;  Laterality: N/A;  . CYST EXCISION    . CYSTOSCOPY W/ URETERAL STENT PLACEMENT Left 10/26/2016   Procedure: CYSTOSCOPY WITH STENT REPLACEMENT;  Surgeon: Nickie Retort, MD;  Location: [facility];  Service: Urology;  Laterality: Left;  . CYSTOSCOPY WITH STENT PLACEMENT Left 10/05/2016   Procedure: CYSTOSCOPY WITH STENT PLACEMENT;  Surgeon: Nickie Retort, MD;  Location: [facility];  Service: Urology;  Laterality: Left;  . HERNIA REPAIR    . INGUINAL HERNIA REPAIR Right   . INGUINAL HERNIA REPAIR Left 01/26/2016   Procedure: HERNIA REPAIR INGUINAL ADULT;  Surgeon: Christene Lye, MD;  Location: [facility];  Service: General;  Laterality: Left;  . KNEE ARTHROSCOPY    . LUMBAR LAMINECTOMY    . POLYPECTOMY  10/31/2015   Procedure: POLYPECTOMY;  Surgeon: Lucilla Lame, MD;  Location: [place];  Service: Endoscopy;;  . URETEROSCOPY Left 10/05/2016   Procedure: URETEROSCOPY;  Surgeon: Nickie Retort, MD;  Location: [facility];  Service: Urology;  Laterality: Left;  . URETEROSCOPY WITH HOLMIUM LASER LITHOTRIPSY Left 10/26/2016   Procedure: URETEROSCOPY WITH HOLMIUM LASER LITHOTRIPSY;  Surgeon: Nickie Retort, MD;  Location: [facility];  Service: Urology;  Laterality: Left;  Marland Kitchen VASECTOMY      Home Medications:  Allergies  as of 12/21/2016      Reactions   Other Swelling   Lips swell - Potatoes, Kiwi, carrots, celery (raw) general anesthesia - nausea       Medication List       Accurate as of 12/21/16  4:07 PM. Always use your most recent med list.          acetaminophen 325 MG tablet Commonly known as:  TYLENOL Take 2 tablets (650 mg total) by mouth every 6 (six) hours as needed for mild pain or fever.   albuterol 108 (90 Base) MCG/ACT inhaler Commonly known  as:  PROVENTIL HFA Inhale 2 puffs into the lungs every 4 (four) hours as needed for wheezing or shortness of breath.   ALLERGY EYE DROPS OP Apply 1 drop to eye daily as needed (allergies).   budesonide-formoterol 160-4.5 MCG/ACT inhaler Commonly known as:  SYMBICORT Inhale 2 puffs into the lungs 2 (two) times daily.   finasteride 5 MG tablet Commonly known as:  PROSCAR Take 1 tablet (5 mg total) by mouth daily.   FLUoxetine 20 MG capsule Commonly known as:  PROZAC TAKE 1 CAPSULE (20 MG TOTAL) BY MOUTH DAILY.   Magnesium Oxide 250 MG Tabs Take 0.5 tablets (125 mg total) by mouth daily.   MULTI ADULT GUMMIES Chew Chew 1 tablet by mouth daily.   olmesartan 20 MG tablet Commonly known as:  BENICAR Take 1 tablet (20 mg total) by mouth daily.   Vitamin C Chew Take 240 mgs once daily       Allergies:  Allergies  Allergen Reactions  . Other Swelling    Lips swell - Potatoes, Kiwi, carrots, celery (raw)  general anesthesia - nausea     Family History: Family History  Problem Relation Age of Onset  . Kidney failure Mother   . Cancer Mother   . Cancer Father        lung  . Prostate cancer Neg Hx     Social History:  reports that he quit smoking about 37 years ago. His smoking use included Cigarettes. He has a 20.00 pack-year smoking history. He has quit using smokeless tobacco. His smokeless tobacco use included Chew. He reports that he drinks alcohol. He reports that he does not use drugs.  ROS: UROLOGY Frequent Urination?: No Hard to postpone urination?: No Burning/pain with urination?: No Get up at night to urinate?: No Leakage of urine?: No Urine stream starts and stops?: No Trouble starting stream?: No Do you have to strain to urinate?: No Blood in urine?: No Urinary tract infection?: No Sexually transmitted disease?: No Injury to kidneys or bladder?: No Painful intercourse?: No Weak stream?: No Erection problems?: No Penile pain?:  No  Gastrointestinal Nausea?: No Vomiting?: No Indigestion/heartburn?: No Diarrhea?: No Constipation?: No  Constitutional Fever: No Night sweats?: No Weight loss?: No Fatigue?: No  Skin Skin rash/lesions?: No Itching?: No  Eyes Blurred vision?: No Double vision?: No  Ears/Nose/Throat Sore throat?: No Sinus problems?: No  Hematologic/Lymphatic Swollen glands?: No Easy bruising?: No  Cardiovascular Leg swelling?: No Chest pain?: No  Respiratory Cough?: No Shortness of breath?: No  Endocrine Excessive thirst?: No  Musculoskeletal Back pain?: No Joint pain?: No  Neurological Headaches?: No Dizziness?: No  Psychologic Depression?: No Anxiety?: No  Physical Exam: There were no vitals taken for this visit.  Constitutional:  Alert and oriented, No acute distress. HEENT: [place] AT, moist mucus membranes.  Trachea midline, no masses. Cardiovascular: No clubbing, cyanosis, or edema. Respiratory: Normal respiratory effort,  no increased work of breathing. GI: Abdomen is soft, nontender, nondistended, no abdominal masses GU: No CVA tenderness.  Skin: No rashes, bruises or suspicious lesions. Lymph: No cervical or inguinal adenopathy. Neurologic: Grossly intact, no focal deficits, moving all 4 extremities. Psychiatric: Normal mood and affect.  Laboratory Data: Lab Results  Component Value Date   WBC 8.8 11/07/2016   HGB 13.6 11/07/2016   HCT 39.0 (L) 11/07/2016   MCV 87.8 11/07/2016   PLT 160 11/07/2016    Lab Results  Component Value Date   CREATININE 1.20 11/06/2016    No results found for: PSA  No results found for: TESTOSTERONE  No results found for: HGBA1C  Urinalysis    Component Value Date/Time   COLORURINE YELLOW (A) 11/04/2016 2114   APPEARANCEUR Clear 11/21/2016 1342   LABSPEC 1.017 11/04/2016 2114   PHURINE 6.0 11/04/2016 2114   GLUCOSEU Negative 11/21/2016 1342   HGBUR MODERATE (A) 11/04/2016 2114   BILIRUBINUR Negative  11/21/2016 1342   KETONESUR NEGATIVE 11/04/2016 2114   PROTEINUR Trace (A) 11/21/2016 1342   PROTEINUR 30 (A) 11/04/2016 2114   UROBILINOGEN 0.2 09/27/2016 1538   NITRITE Negative 11/21/2016 1342   NITRITE NEGATIVE 11/04/2016 2114   LEUKOCYTESUR 1+ (A) 11/21/2016 1342    Pertinent Imaging: Renal u/s reviewed as above  Assessment & Plan:    1. History of nephrolithiasis I discussed with the patient his stone analysis. We removed the ABCs of stone formation booklet. We discussed ways to prevent future stone formation. The patient will follow-up with [place] as needed.  Return if symptoms worsen or fail to improve.  Nickie Retort, MD  [facility] [address] [place], [place] [address] [phone]

## 2016-12-22 ENCOUNTER — Other Ambulatory Visit: Payer: Self-pay | Admitting: Family Medicine

## 2016-12-23 ENCOUNTER — Other Ambulatory Visit: Payer: Self-pay | Admitting: Family Medicine

## 2017-01-03 ENCOUNTER — Other Ambulatory Visit: Payer: Self-pay | Admitting: Family Medicine

## 2017-01-03 NOTE — Telephone Encounter (Signed)
May I respectfully ask you to manage this refill? Thank you

## 2017-02-12 ENCOUNTER — Encounter: Payer: Self-pay | Admitting: Family Medicine

## 2017-02-12 ENCOUNTER — Ambulatory Visit
Admission: RE | Admit: 2017-02-12 | Discharge: 2017-02-12 | Disposition: A | Discharge status: Home / Self Care | Payer: Federal, State, Local not specified - PPO | Source: Ambulatory Visit | Attending: Family Medicine | Admitting: Family Medicine

## 2017-02-12 ENCOUNTER — Ambulatory Visit (INDEPENDENT_AMBULATORY_CARE_PROVIDER_SITE_OTHER): Payer: Federal, State, Local not specified - PPO | Admitting: Family Medicine

## 2017-02-12 VITALS — BP 124/76 | HR 94 | Temp 98.3°F | Resp 18 | Ht 69.0 in | Wt 238.8 lb

## 2017-02-12 DIAGNOSIS — J449 Chronic obstructive pulmonary disease, unspecified: Secondary | ICD-10-CM | POA: Insufficient documentation

## 2017-02-12 DIAGNOSIS — J45901 Unspecified asthma with (acute) exacerbation: Principal | ICD-10-CM

## 2017-02-12 DIAGNOSIS — J441 Chronic obstructive pulmonary disease with (acute) exacerbation: Secondary | ICD-10-CM | POA: Diagnosis not present

## 2017-02-12 DIAGNOSIS — R05 Cough: Secondary | ICD-10-CM | POA: Diagnosis not present

## 2017-02-12 DIAGNOSIS — J069 Acute upper respiratory infection, unspecified: Secondary | ICD-10-CM | POA: Diagnosis not present

## 2017-02-12 MED ORDER — FLUTICASONE FUROATE-VILANTEROL 200-25 MCG/INH IN AEPB: 1.0000 | INHALATION_SPRAY | Freq: Every day | RESPIRATORY_TRACT | Status: DC

## 2017-02-12 MED ORDER — PREDNISONE 10 MG PO TABS: 10.0000 mg | ORAL_TABLET | Freq: Every day | ORAL | 0 refills | Status: DC

## 2017-02-12 NOTE — Patient Instructions (Addendum)
Chronic Obstructive Pulmonary Disease Exacerbation  Chronic obstructive pulmonary disease (COPD) is a common lung problem. In COPD, the flow of air from the lungs is limited. COPD exacerbations are times that breathing gets worse and you need extra treatment. Without treatment they can be life threatening. If they happen often, your lungs can become more damaged. If your COPD gets worse, your doctor may treat you with:  ? Medicines.  ? Oxygen.  ? Different ways to clear your airway, such as using a mask.    Follow these instructions at home:  ? Do not smoke.  ? Avoid tobacco smoke and other things that bother your lungs.  ? If given, take your antibiotic medicine as told. Finish the medicine even if you start to feel better.  ? Only take medicines as told by your doctor.  ? Drink enough fluids to keep your pee (urine) clear or pale yellow (unless your doctor has told you not to).  ? Use a cool mist machine (vaporizer).  ? If you use oxygen or a machine that turns liquid medicine into a mist (nebulizer), continue to use them as told.  ? Keep up with shots (vaccinations) as told by your doctor.  ? Exercise regularly.  ? Eat healthy foods.  ? Keep all doctor visits as told.  Get help right away if:  ? You are very short of breath and it gets worse.  ? You have trouble talking.  ? You have bad chest pain.  ? You have blood in your spit (sputum).  ? You have a fever.  ? You keep throwing up (vomiting).  ? You feel weak, or you pass out (faint).  ? You feel confused.  ? You keep getting worse.  This information is not intended to replace advice given to you by your health care provider. Make sure you discuss any questions you have with your health care provider.  Document Released: 07/12/2011 Document Revised: 12/29/2015 Document Reviewed: 03/27/2013  Elsevier Interactive Patient Education ? 2017 [employer]

## 2017-02-12 NOTE — Progress Notes (Addendum)
Name: Willie Crane   MRN: [id number]    DOB: [birth year]   Date:02/12/2017       Progress Note  Subjective  Chief Complaint  Chief Complaint  Patient presents with  . URI    cough, chest congestion, fever    HPI  Pt presents with 6 day history of congested cough, fatigue, nasal congestion, fevers/chills, chest pain only when coughing, some shortness of breath when coughing and sometimes with exertion.  No sore throat, ear pain/pressure, body aches, abdominal pain, NVD.  Has Asthma and COPD - Takes symbicort as prescribed, using albuterol PRN.  He was taking Breo and really felt like it helped him, but it was too expensive, then switched to [place], but was also too expensive. Dr. Mortimer Fries had to switch patient to Symbicort due to these costs. Pt would like to try Breo again if possible. Does not have follow-up schedule with Dr. Mortimer Fries, and they will call today to schedule an appointment.  Patient Active Problem List   Diagnosis Date Noted  . Hypomagnesemia 11/16/2016  . Hypokalemia 11/16/2016  . Acute pyelonephritis 11/04/2016  . Pyelonephritis 11/04/2016  . Nephrolithiasis 09/28/2016  . Bilateral Simple Renal Cysts 09/28/2016  . History of acute myocardial infarction 09/27/2016  . Prolonged Q-T interval on ECG 09/27/2016  . Nausea after anesthesia 01/26/2016  . Obesity 12/27/2015  . Medication monitoring encounter 12/27/2015  . Prostate cancer screening   . Benign neoplasm of transverse colon   . Encounter for screening for malignant neoplasm of colon 10/21/2015  . Moderate persistent asthma 09/28/2015  . 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 Hydrocele, bilateral 07/12/2015  . Hyperlipidemia   . Hypertension     Social History  Substance Use Topics  . Smoking status: Former Smoker    Packs/day: 1.00    Years: 20.00    Types: Cigarettes    Quit date: 01/19/1979  . Smokeless tobacco: Former Systems developer    Types: Chew     Comment: quit smoking 30 years   . Alcohol use 0.0 oz/week     Comment: wine 1x/mo     Current Outpatient Prescriptions:  .  acetaminophen (TYLENOL) 325 MG tablet, Take 2 tablets (650 mg total) by mouth every 6 (six) hours as needed for mild pain or fever., Disp: , Rfl:  .  albuterol (PROVENTIL HFA) 108 (90 Base) MCG/ACT inhaler, Inhale 2 puffs into the lungs every 4 (four) hours as needed for wheezing or shortness of breath., Disp: 3 Inhaler, Rfl: 1 .  Bioflavonoid Products (VITAMIN C) CHEW, Take 240 mgs once daily, Disp: , Rfl:  .  budesonide-formoterol (SYMBICORT) 160-4.5 MCG/ACT inhaler, Inhale 2 puffs into the lungs 2 (two) times daily., Disp: 3 Inhaler, Rfl: 3 .  finasteride (PROSCAR) 5 MG tablet, TAKE 1 TABLET BY MOUTH DAILY, Disp: 90 tablet, Rfl: 1 .  FLUoxetine (PROZAC) 20 MG capsule, TAKE 1 CAPSULE (20 MG TOTAL) BY MOUTH DAILY., Disp: 90 capsule, Rfl: 3 .  Ketotifen Fumarate (ALLERGY EYE DROPS OP), Apply 1 drop to eye daily as needed (allergies)., Disp: , Rfl:  .  Magnesium Oxide 250 MG TABS, Take 0.5 tablets (125 mg total) by mouth daily., Disp: 15 tablet, Rfl: 0 .  Multiple Vitamins-Minerals (MULTI ADULT GUMMIES) CHEW, Chew 1 tablet by mouth daily., Disp: , Rfl:  .  olmesartan (BENICAR) 20 MG tablet, TAKE 1 TABLET (20 MG TOTAL) BY MOUTH DAILY., Disp: 90 tablet, Rfl: 1  Allergies  Allergen Reactions  . Other Swelling  Lips swell - Potatoes, Kiwi, carrots, celery (raw)  general anesthesia - nausea     ROS  Ten systems reviewed and is negative except as mentioned in HPI  Objective  Vitals:   02/12/17 1056  BP: 124/76  Pulse: 94  Resp: 18  Temp: 98.3 F (36.8 C)  TempSrc: Oral  SpO2: 94%  Weight: 238 lb 12.8 oz (108.3 kg)  Height: 5\' 9"  (1.753 m)   Body mass index is 35.26 kg/m.  Nursing Note and Vital Signs reviewed.  Physical Exam  Constitutional: Patient appears well-developed and well-nourished. Obese No distress.  HEENT: head atraumatic, normocephalic, pupils equal and reactive to  light, EOM's intact, TM's without erythema or bulging, no maxillary or frontal sinus pain on palpation, turbinates moderately inflamed neck supple without lymphadenopathy, oropharynx pink and moist without exudate Cardiovascular: Normal rate, regular rhythm, S1/S2 present.  No murmur or rub heard. No BLE edema. Pulmonary/Chest: Effort normal and breath sounds diminished in BUE, no wheezing or rhonchi, No respiratory distress or retractions. Pt coughing intermittently during examination, irritated with deep inspiration Psychiatric: Patient has a normal mood and affect. behavior is normal. Judgment and thought content normal.  Recent Results (from the past 2160 hour(s))  Urinalysis, Complete     Status: Abnormal   Collection Time: 11/21/16  1:42 PM  Result Value Ref Range   Specific Gravity, UA 1.020 1.005 - 1.030   pH, UA 5.5 5.0 - 7.5   Color, UA Yellow Yellow   Appearance Ur Clear Clear   Leukocytes, UA 1+ (A) Negative   Protein, UA Trace (A) Negative/Trace   Glucose, UA Negative Negative   Ketones, UA Negative Negative   RBC, UA 3+ (A) Negative   Bilirubin, UA Negative Negative   Urobilinogen, Ur 0.2 0.2 - 1.0 mg/dL   Nitrite, UA Negative Negative   Microscopic Examination See below:   Microscopic Examination     Status: Abnormal   Collection Time: 11/21/16  1:42 PM  Result Value Ref Range   WBC, UA 6-10 (A) 0 - 5 /hpf   RBC, UA >30 (H) 0 - 2 /hpf   Epithelial Cells (non renal) 0-10 0 - 10 /hpf   Bacteria, UA Few (A) None seen/Few     Assessment & Plan  1. Asthma with COPD with exacerbation ([employer]) - DG Chest 2 View; Future - predniSONE (DELTASONE) 10 MG tablet; Take 1 tablet (10 mg total) by mouth daily with breakfast. Day1:6tabs, Day2:5tabs, Day3:4tabs, Day4:3tabs, Day5:2tabs, Day6:1tabs  Dispense: 21 tablet; Refill: 0 - fluticasone furoate-vilanterol (BREO ELLIPTA) 200-25 MCG/INH AEPB; Inhale 1 puff into the lungs daily. - Patient is given sample of Breo. He will stop  Symbicort and start Breo, pt will schedule close follow up with Dr. Mortimer Fries, pulmonologist for further inhaler/COPD management.  2. Viral upper respiratory illness Drink plenty of fluids, Tylenol PRN for fever or body aches, cool mist humidifier at home.  -Red flags and when to present for emergency care or RTC including fever >101.76F, chest pain, shortness of breath not improving with rescue inhaler, new/worsening/un-resolving symptoms, reviewed with patient at time of visit. Follow up and care instructions discussed and provided in AVS.  I have reviewed this encounter including the documentation in this note and/or discussed this patient with the Johney Maine, FNP, NP-C. I am certifying that I agree with the content of this note as supervising physician.  Steele Sizer, MD [facility] 02/12/2017, 2:52 PM

## 2017-02-13 NOTE — Progress Notes (Signed)
Please call patient - Chest Xray negative for PNA, shows COPD which is expected with his current symptoms.  Continue plan of care as discussed yesterday.

## 2017-02-14 ENCOUNTER — Other Ambulatory Visit: Payer: Self-pay | Admitting: Internal Medicine

## 2017-02-14 MED ORDER — ALBUTEROL SULFATE HFA 108 (90 BASE) MCG/ACT IN AERS: 2.0000 | INHALATION_SPRAY | RESPIRATORY_TRACT | 1 refills | Status: DC | PRN

## 2017-02-21 ENCOUNTER — Telehealth: Payer: Self-pay | Admitting: Emergency Medicine

## 2017-02-21 NOTE — Telephone Encounter (Signed)
Pt needs appointment with myself or Dr. Sanda Klein - cannot call in antibiotics without seeing him.  Thanks so much!

## 2017-02-21 NOTE — Telephone Encounter (Signed)
Appointment made for tomorrow 02/22/17 with Raquel Sarna

## 2017-02-21 NOTE — Telephone Encounter (Signed)
Wife called patient is not feeling any better. Spitting up greenish stuff. Hard to breath when lying down. Cough is no better. Cannot get in to see Pulmonology for 1 week. Prednisone is not helping as much. Need antibiotic called in to pharmacy

## 2017-02-22 ENCOUNTER — Ambulatory Visit (INDEPENDENT_AMBULATORY_CARE_PROVIDER_SITE_OTHER): Payer: Federal, State, Local not specified - PPO | Admitting: Family Medicine

## 2017-02-22 ENCOUNTER — Encounter: Payer: Self-pay | Admitting: Family Medicine

## 2017-02-22 VITALS — BP 124/74 | HR 87 | Temp 98.0°F | Resp 18 | Ht 69.0 in | Wt 246.1 lb

## 2017-02-22 DIAGNOSIS — J181 Lobar pneumonia, unspecified organism: Secondary | ICD-10-CM

## 2017-02-22 DIAGNOSIS — J45901 Unspecified asthma with (acute) exacerbation: Secondary | ICD-10-CM

## 2017-02-22 DIAGNOSIS — R059 Cough, unspecified: Secondary | ICD-10-CM

## 2017-02-22 DIAGNOSIS — R05 Cough: Secondary | ICD-10-CM

## 2017-02-22 DIAGNOSIS — J441 Chronic obstructive pulmonary disease with (acute) exacerbation: Secondary | ICD-10-CM | POA: Diagnosis not present

## 2017-02-22 DIAGNOSIS — J189 Pneumonia, unspecified organism: Secondary | ICD-10-CM

## 2017-02-22 MED ORDER — AZITHROMYCIN 250 MG PO TABS: ORAL_TABLET | ORAL | 0 refills | Status: DC

## 2017-02-22 MED ORDER — DOXYCYCLINE HYCLATE 100 MG PO TABS: 100.0000 mg | ORAL_TABLET | Freq: Two times a day (BID) | ORAL | 0 refills | Status: AC

## 2017-02-22 MED ORDER — BENZONATATE 100 MG PO CAPS: 200.0000 mg | ORAL_CAPSULE | Freq: Three times a day (TID) | ORAL | 0 refills | Status: DC | PRN

## 2017-02-22 NOTE — Patient Instructions (Addendum)

## 2017-02-22 NOTE — Progress Notes (Addendum)
Name: Willie Crane   MRN: [id number]    DOB: [birth year]   Date:02/22/2017       Progress Note  Subjective  Chief Complaint  Chief Complaint  Patient presents with  . Cough    coughing up green mucous  . Follow-up    not feeling much better    HPI  Pt presents to follow up on respiratory infection and COPD exacerbation. He used Breo but is back to using Symbicort because the Breo did not make a big difference. Has been using albuterol inhaler PRN. Has appt with pulmonology next week. He has not felt any better and finished the prednisone taper. Having wheezing, chest congestion, and productive (green/yellow mucus with occasional pink tinge) cough.  Previous chest Xray was negative on 02/12/2017.  Has not been able to sleep and feel like this is preventing him from getting better faster.  Still having fatigue, nasal congestion, and some shortness of breath.  No NVD or abdominal pain, body aches.  Patient Active Problem List   Diagnosis Date Noted  . Stage 2 moderate COPD by GOLD classification ([place]) 02/12/2017  . Hypomagnesemia 11/16/2016  . Hypokalemia 11/16/2016  . Acute pyelonephritis 11/04/2016  . Pyelonephritis 11/04/2016  . Nephrolithiasis 09/28/2016  . Bilateral Simple Renal Cysts 09/28/2016  . History of acute myocardial infarction 09/27/2016  . Prolonged Q-T interval on ECG 09/27/2016  . Nausea after anesthesia 01/26/2016  . Obesity 12/27/2015  . Medication monitoring encounter 12/27/2015  . Prostate cancer screening   . Benign neoplasm of transverse colon   . Encounter for screening for malignant neoplasm of colon 10/21/2015  . Moderate persistent asthma 09/28/2015  . 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 Hydrocele, bilateral 07/12/2015  . Hyperlipidemia   . Hypertension     Social History  Substance Use Topics  . Smoking status: Former Smoker    Packs/day: 1.00    Years: 20.00    Types: Cigarettes    Quit date: 01/19/1979  .  Smokeless tobacco: Former Systems developer    Types: Chew     Comment: quit smoking 30 years  . Alcohol use 0.0 oz/week     Comment: wine 1x/mo     Current Outpatient Prescriptions:  .  acetaminophen (TYLENOL) 325 MG tablet, Take 2 tablets (650 mg total) by mouth every 6 (six) hours as needed for mild pain or fever., Disp: , Rfl:  .  albuterol (PROVENTIL HFA) 108 (90 Base) MCG/ACT inhaler, Inhale 2 puffs into the lungs every 4 (four) hours as needed for wheezing or shortness of breath., Disp: 3 Inhaler, Rfl: 1 .  Bioflavonoid Products (VITAMIN C) CHEW, Take 240 mgs once daily, Disp: , Rfl:  .  finasteride (PROSCAR) 5 MG tablet, TAKE 1 TABLET BY MOUTH DAILY, Disp: 90 tablet, Rfl: 1 .  FLUoxetine (PROZAC) 20 MG capsule, TAKE 1 CAPSULE (20 MG TOTAL) BY MOUTH DAILY., Disp: 90 capsule, Rfl: 3 .  fluticasone furoate-vilanterol (BREO ELLIPTA) 200-25 MCG/INH AEPB, Inhale 1 puff into the lungs daily., Disp: , Rfl:  .  Ketotifen Fumarate (ALLERGY EYE DROPS OP), Apply 1 drop to eye daily as needed (allergies)., Disp: , Rfl:  .  Magnesium Oxide 250 MG TABS, Take 0.5 tablets (125 mg total) by mouth daily., Disp: 15 tablet, Rfl: 0 .  Multiple Vitamins-Minerals (MULTI ADULT GUMMIES) CHEW, Chew 1 tablet by mouth daily., Disp: , Rfl:  .  olmesartan (BENICAR) 20 MG tablet, TAKE 1 TABLET (20 MG TOTAL) BY MOUTH DAILY., Disp: 90 tablet,  Rfl: 1 .  benzonatate (TESSALON PERLES) 100 MG capsule, Take 2 capsules (200 mg total) by mouth 3 (three) times daily as needed for cough., Disp: 20 capsule, Rfl: 0 .  doxycycline (VIBRA-TABS) 100 MG tablet, Take 1 tablet (100 mg total) by mouth 2 (two) times daily., Disp: 20 tablet, Rfl: 0 .  predniSONE (DELTASONE) 10 MG tablet, Take 1 tablet (10 mg total) by mouth daily with breakfast. Day1:6tabs, Day2:5tabs, Day3:4tabs, Day4:3tabs, Day5:2tabs, Day6:1tabs (Patient not taking: Reported on 02/22/2017), Disp: 21 tablet, Rfl: 0  Allergies  Allergen Reactions  . Other Swelling    Lips swell -  Potatoes, Kiwi, carrots, celery (raw)  general anesthesia - nausea     ROS  Ten systems reviewed and is negative except as mentioned in HPI  Objective  Vitals:   02/22/17 0905  BP: 124/74  Pulse: 87  Resp: 18  Temp: 98 F (36.7 C)  TempSrc: Oral  SpO2: 95%  Weight: 246 lb 1.6 oz (111.6 kg)  Height: 5\' 9"  (1.753 m)   Body mass index is 36.34 kg/m.  Nursing Note and Vital Signs reviewed.  Physical Exam  Constitutional: Patient appears well-developed and well-nourished. Obese No distress.  HEENT: head atraumatic, normocephalic, pupils equal and reactive to light, EOM's intact, TM's without erythema or bulging, no maxillary or frontal sinus pain on palpation, neck supple without lymphadenopathy, oropharynx mildly erythematous and moist without exudate Cardiovascular: Normal rate, regular rhythm, S1/S2 present.  No murmur or rub heard. No BLE edema. Pulmonary/Chest: Effort normal and breath sounds find RUL with expiratory wheezes, RLL diminished with expiratory wheezes, LLL diminished. No respiratory distress or retractions. Psychiatric: Patient has a normal mood and affect. behavior is normal. Judgment and thought content normal.  No results found for this or any previous visit (from the past 2160 hour(s)).  Assessment & Plan 1. Community acquired pneumonia of right lower lobe of lung (HCC) - doxycycline (VIBRA-TABS) 100 MG tablet; Take 1 tablet (100 mg total) by mouth 2 (two) times daily.  Dispense: 20 tablet; Refill: 0  2. Asthma with COPD with exacerbation ([place]) Continue inhalers as prescribed - discussed PRN use of albuterol with patient and encouraged its use Q4H PRN for wheezing or shortness of breath.  3. Cough - benzonatate (TESSALON PERLES) 100 MG capsule; Take 2 capsules (200 mg total) by mouth 3 (three) times daily as needed for cough.  Dispense: 20 capsule; Refill: 0  -Red flags and when to present for emergency care or RTC including fever >101.28F, chest pain,  shortness of breath or wheezing unrelieved with albuterol inhaler, new/worsening/un-resolving symptoms, malaise/fatigue/lethargy reviewed with patient at time of visit.  Advised to go to urgent care or ER if feeling worse by tomorrow (Saturday) or to call the on-call service if unsure of what to do.  Follow up and care instructions discussed and provided in AVS.  I have reviewed this encounter including the documentation in this note and/or discussed this patient with the Johney Maine, FNP, NP-C. I am certifying that I agree with the content of this note as supervising physician.  Steele Sizer, MD [facility] 02/22/2017, 11:47 AM

## 2017-02-28 ENCOUNTER — Ambulatory Visit (INDEPENDENT_AMBULATORY_CARE_PROVIDER_SITE_OTHER): Payer: Federal, State, Local not specified - PPO | Admitting: Internal Medicine

## 2017-02-28 ENCOUNTER — Encounter: Payer: Self-pay | Admitting: Internal Medicine

## 2017-02-28 VITALS — BP 128/88 | HR 80 | Resp 16 | Ht 69.0 in | Wt 242.0 lb

## 2017-02-28 DIAGNOSIS — J441 Chronic obstructive pulmonary disease with (acute) exacerbation: Secondary | ICD-10-CM

## 2017-02-28 NOTE — Patient Instructions (Signed)
Continue inhalers as prescribed 

## 2017-02-28 NOTE — Progress Notes (Signed)
[facility] Consultation      Date: 02/28/2017,   MRN# [id number] Willie Crane [birth year] Code Status:  Code Status History    Date Active Date Inactive Code Status Order ID Comments User Context   01/26/2016  6:52 PM 01/27/2016 12:40 PM Full Code [id number]  Robert Bellow, MD Inpatient     Hosp day:@LENGTHOFSTAYDAYS @ Referring MD: @ATDPROV @     PCP:      AdmissionWeight: 242 lb (109.8 kg)                 CurrentWeight: 242 lb (109.8 kg) Willie Crane is a 71 y.o. old male seen in consultation for cough/congestion at the request of Dr. Sanda Klein.  Previous history 71 yo white male with dx of ASTHMA in his 4's-he is [place] Vet Patient has been on inhaled steroids for many years. Patient has chronic fatigue and SOB and DOE for very long time Quit smoking 40 years ago Smoked 1 ppd for 20 years Worked as Gaffer, retired 5 years now Has gained 40 pounds in 2 years    CHIEF COMPLAINT:   Cough, follow up moderate COPD Follow up Moderate COPD  HISTORY OF PRESENT ILLNESS    patient had COPD exacerbation 2 weeks ago Was treated with prednisone therapy and doxycycline  patient now on Symbicort and doing well on it-breathing seems to be in control  no signs of infection at this time No signs of acute and severe COPD exacerbation at this time   wheezing is under control  no signs of CHF at this time   Patient refuses sleep study and also refuses to use oxygen if prescribed   Office Willie Crane shows ratio 68% Fev1 1.8L 64% fef25/75 50% Moderate Obstructive and Restrictive lung disease    Current Medication:  Current Outpatient Prescriptions:  .  acetaminophen (TYLENOL) 325 MG tablet, Take 2 tablets (650 mg total) by mouth every 6 (six) hours as needed for mild pain or fever., Disp: , Rfl:  .  albuterol (PROVENTIL HFA) 108 (90 Base) MCG/ACT inhaler, Inhale 2 puffs into the lungs every 4 (four) hours as needed for wheezing or shortness of  breath., Disp: 3 Inhaler, Rfl: 1 .  Bioflavonoid Products (VITAMIN C) CHEW, Take 240 mgs once daily, Disp: , Rfl:  .  doxycycline (VIBRA-TABS) 100 MG tablet, Take 1 tablet (100 mg total) by mouth 2 (two) times daily., Disp: 20 tablet, Rfl: 0 .  finasteride (PROSCAR) 5 MG tablet, TAKE 1 TABLET BY MOUTH DAILY, Disp: 90 tablet, Rfl: 1 .  FLUoxetine (PROZAC) 20 MG capsule, TAKE 1 CAPSULE (20 MG TOTAL) BY MOUTH DAILY., Disp: 90 capsule, Rfl: 3 .  fluticasone furoate-vilanterol (BREO ELLIPTA) 200-25 MCG/INH AEPB, Inhale 1 puff into the lungs daily., Disp: , Rfl:  .  Ketotifen Fumarate (ALLERGY EYE DROPS OP), Apply 1 drop to eye daily as needed (allergies)., Disp: , Rfl:  .  Magnesium Oxide 250 MG TABS, Take 0.5 tablets (125 mg total) by mouth daily., Disp: 15 tablet, Rfl: 0 .  Multiple Vitamins-Minerals (MULTI ADULT GUMMIES) CHEW, Chew 1 tablet by mouth daily., Disp: , Rfl:  .  olmesartan (BENICAR) 20 MG tablet, TAKE 1 TABLET (20 MG TOTAL) BY MOUTH DAILY., Disp: 90 tablet, Rfl: 1 .  predniSONE (DELTASONE) 10 MG tablet, Take 1 tablet (10 mg total) by mouth daily with breakfast. Day1:6tabs, Day2:5tabs, Day3:4tabs, Day4:3tabs, Day5:2tabs, Day6:1tabs, Disp: 21 tablet, Rfl: 0    ALLERGIES   Other     REVIEW  OF SYSTEMS   Review of Systems  Constitutional: Negative for chills, diaphoresis, fever, malaise/fatigue and weight loss.  HENT: Negative for congestion and hearing loss.   Eyes: Negative for blurred vision and double vision.  Respiratory: Negative for cough, hemoptysis, sputum production, shortness of breath and wheezing.   Cardiovascular: Negative for chest pain, palpitations, orthopnea and leg swelling.  Gastrointestinal: Negative for abdominal pain, heartburn, nausea and vomiting.  Skin: Negative for rash.  Neurological: Negative for weakness and headaches.  All other systems reviewed and are negative.    VS: BP 128/88 (BP Location: Left Arm, Cuff Size: Normal)   Pulse 80   Resp 16    Ht 5\' 9"  (1.753 m)   Wt 242 lb (109.8 kg)   SpO2 94%   BMI 35.74 kg/m      PHYSICAL EXAM  Physical Exam  Constitutional: He is oriented to person, place, and time. He appears well-developed and well-nourished. No distress.  HENT:  Mouth/Throat: No oropharyngeal exudate.  Neck: Neck supple.  Cardiovascular: Normal rate, regular rhythm and normal heart sounds.   No murmur heard. Pulmonary/Chest: Effort normal and breath sounds normal. No stridor. No respiratory distress. He has no wheezes.  Musculoskeletal: Normal range of motion. He exhibits no edema.  Neurological: He is alert and oriented to person, place, and time.  Skin: Skin is warm. He is not diaphoretic.  Psychiatric: He has a normal mood and affect.       ASSESSMENT/PLAN   71 yo pleasant overweight white male with Moderate COPD Gold Stage B with signs and symptoms of sleep apnea Patient refuses sleep study, and also refuses to use oxygen if prescribed,  Patient with recent acute COPD exacerbation but is  Not in distress at this time  1.contineu SYmbicort as prescribed 2.albuterol as needed 3. Patient advised to call us for any respiratory issues   Follow up in 6 months    Patient satisfied with Plan of action and management. All questions answered  Corrin Parker, M.D.  [facility]  Medical Director [place] Director [facility] Cardio-Pulmonary Department

## 2017-05-08 ENCOUNTER — Telehealth: Payer: Self-pay | Admitting: Internal Medicine

## 2017-05-08 NOTE — Telephone Encounter (Signed)
Prod cough w/yellow x couple days with congestion, denies fever, chest pain, SOB. Please advise. DK pt.

## 2017-05-08 NOTE — Telephone Encounter (Signed)
Pt states he has a cold, no temp, no pain in chest, phlem in lungs. Please call.

## 2017-05-08 NOTE — Telephone Encounter (Signed)
Looks like he had steroid taper not long ago. Would start him on a nebulizer, use duoneb 4 times per day until he feels better.

## 2017-05-08 NOTE — Telephone Encounter (Signed)
Called pt to inform him of response. He states he will give it a few more days and if he doesn't feel any better then we can get the nebulizer machine and send in the medication. At this pt pt will wait. Nothing further needed.

## 2017-06-16 ENCOUNTER — Other Ambulatory Visit: Payer: Self-pay | Admitting: Family Medicine

## 2017-07-08 ENCOUNTER — Other Ambulatory Visit: Payer: Self-pay

## 2017-07-08 DIAGNOSIS — N401 Enlarged prostate with lower urinary tract symptoms: Secondary | ICD-10-CM

## 2017-07-09 ENCOUNTER — Other Ambulatory Visit: Payer: Federal, State, Local not specified - PPO

## 2017-07-09 DIAGNOSIS — N401 Enlarged prostate with lower urinary tract symptoms: Secondary | ICD-10-CM

## 2017-07-10 LAB — PSA: Prostate Specific Ag, Serum: 1.5 ng/mL (ref 0.0–4.0)

## 2017-07-10 NOTE — Progress Notes (Signed)
07/11/2017 2:48 PM   Willie Crane 1945/08/08 [id number]  Referring provider: Arnetha Courser, MD [address] [place],  [id number]  Chief Complaint  Patient presents with  . Benign Prostatic Hypertrophy    HPI: 71 yo WM with bilateral nephrolithiasis and BPH with LU TS who presents today for a one year follow up.    Bilateral nephrolithiasis Contrast CT performed in 11/2016 noted small bilateral stones.    BPH WITH LUTS  (prostate and/or bladder) His IPSS score today is 13, which is moderate lower urinary tract symptomatology.  He is mostly satisfied with his quality life due to his urinary symptoms.  His previous I PSS score was 12/1.  His major complaint(s) today is/are intermittency and a weak stream.   He has had these symptoms for few years.  He denies any dysuria, hematuria or suprapubic pain.  He currently taking finasteride 5 mg daily.   He also denies any recent fevers, chills, nausea or vomiting. He does not have a family history of PCa.  IPSS    Row Name 07/11/17 1400         International Prostate Symptom Score   How often have you had the sensation of not emptying your bladder?  Almost always     How often have you had to urinate less than every two hours?  Less than 1 in 5 times     How often have you found you stopped and started again several times when you urinated?  Less than half the time     How often have you found it difficult to postpone urination?  Less than 1 in 5 times     How often have you had a weak urinary stream?  Less than half the time     How often have you had to strain to start urination?  Less than 1 in 5 times     How many times did you typically get up at night to urinate?  1 Time     Total IPSS Score  13       Quality of Life due to urinary symptoms   If you were to spend the rest of your life with your urinary condition just the way it is now how would you feel about that?  Mostly Satisfied        Score:    1-7 Mild 8-19 Moderate 20-35 Severe  PMH: Past Medical History:  Diagnosis Date  . Anxiety   . Asthma   . Benign paroxysmal positional vertigo   . BPH (benign prostatic hypertrophy)   . Complication of anesthesia   . COPD (chronic obstructive pulmonary disease) ([place])   . Coronary artery disease    history of heart attack  . Degeneration of lumbar or lumbosacral intervertebral disc   . History of kidney stones   . Hyperlipidemia   . Hypertension   . MI (myocardial infarction) ([place])    1980s  . Obesity, unspecified   . PONV (postoperative nausea and vomiting)   . PTSD (post-traumatic stress disorder)   . Renal disorder    kidney stones  . Thoracic or lumbosacral neuritis or radiculitis, unspecified   . Unspecified congenital anomaly of the integument   . Unspecified gastritis and gastroduodenitis without mention of hemorrhage     Surgical History: Past Surgical History:  Procedure Laterality Date  . BACK SURGERY    . CARDIAC CATHETERIZATION  03/2009   [facility]: No significant coronary artery disease  .  CARDIAC CATHETERIZATION  01/2014   [facility]  . CARPAL TUNNEL RELEASE Right   . CHOLECYSTECTOMY    . COLONOSCOPY    . COLONOSCOPY WITH PROPOFOL N/A 10/31/2015   Procedure: COLONOSCOPY WITH PROPOFOL;  Surgeon: Lucilla Lame, MD;  Location: [facility];  Service: Endoscopy;  Laterality: N/A;  . CYST EXCISION    . CYSTOSCOPY W/ URETERAL STENT PLACEMENT Left 10/26/2016   Procedure: CYSTOSCOPY WITH STENT REPLACEMENT;  Surgeon: Nickie Retort, MD;  Location: [facility];  Service: Urology;  Laterality: Left;  . CYSTOSCOPY WITH STENT PLACEMENT Left 10/05/2016   Procedure: CYSTOSCOPY WITH STENT PLACEMENT;  Surgeon: Nickie Retort, MD;  Location: [facility];  Service: Urology;  Laterality: Left;  . HERNIA REPAIR    . INGUINAL HERNIA REPAIR Right   . INGUINAL HERNIA REPAIR Left 01/26/2016   Procedure: HERNIA REPAIR INGUINAL ADULT;  Surgeon: Christene Lye, MD;  Location: [facility];  Service: General;  Laterality: Left;  . KNEE ARTHROSCOPY    . LUMBAR LAMINECTOMY    . POLYPECTOMY  10/31/2015   Procedure: POLYPECTOMY;  Surgeon: Lucilla Lame, MD;  Location: [facility];  Service: Endoscopy;;  . URETEROSCOPY Left 10/05/2016   Procedure: URETEROSCOPY;  Surgeon: Nickie Retort, MD;  Location: [facility];  Service: Urology;  Laterality: Left;  . URETEROSCOPY WITH HOLMIUM LASER LITHOTRIPSY Left 10/26/2016   Procedure: URETEROSCOPY WITH HOLMIUM LASER LITHOTRIPSY;  Surgeon: Nickie Retort, MD;  Location: [facility];  Service: Urology;  Laterality: Left;  Marland Kitchen VASECTOMY      Home Medications:  Allergies as of 07/11/2017      Reactions   Other Swelling   Lips swell - Potatoes, Kiwi, carrots, celery (raw) general anesthesia - nausea       Medication List        Accurate as of 07/11/17  2:48 PM. Always use your most recent med list.          acetaminophen 325 MG tablet Commonly known as:  TYLENOL Take 2 tablets (650 mg total) by mouth every 6 (six) hours as needed for mild pain or fever.   albuterol 108 (90 Base) MCG/ACT inhaler Commonly known as:  PROVENTIL HFA Inhale 2 puffs into the lungs every 4 (four) hours as needed for wheezing or shortness of breath.   ALLERGY EYE DROPS OP Apply 1 drop to eye daily as needed (allergies).   finasteride 5 MG tablet Commonly known as:  PROSCAR Take 1 tablet (5 mg total) by mouth daily.   FLUoxetine 20 MG capsule Commonly known as:  PROZAC TAKE 1 CAPSULE (20 MG TOTAL) BY MOUTH DAILY.   MULTI ADULT GUMMIES Chew Chew 1 tablet by mouth daily.   olmesartan 20 MG tablet Commonly known as:  BENICAR TAKE 1 TABLET (20 MG TOTAL) BY MOUTH DAILY.   Vitamin C Chew Take 240 mgs once daily       Allergies:  Allergies  Allergen Reactions  . Other Swelling    Lips swell - Potatoes, Kiwi, carrots, celery (raw)  general anesthesia - nausea     Family History: Family History  Problem Relation Age of Onset  .  Kidney failure Mother   . Cancer Mother   . Cancer Father        lung  . Prostate cancer Neg Hx     Social History:  reports that he quit smoking about 38 years ago. His smoking use included cigarettes. He has a 20.00 pack-year smoking history. He has quit using  smokeless tobacco. His smokeless tobacco use included chew. He reports that he drinks alcohol. He reports that he does not use drugs.  ROS: UROLOGY Frequent Urination?: No Hard to postpone urination?: No Burning/pain with urination?: No Get up at night to urinate?: No Leakage of urine?: No Urine stream starts and stops?: Yes Trouble starting stream?: No Do you have to strain to urinate?: No Blood in urine?: No Urinary tract infection?: No Sexually transmitted disease?: No Injury to kidneys or bladder?: No Painful intercourse?: No Weak stream?: Yes Erection problems?: No Penile pain?: No  Gastrointestinal Nausea?: No Vomiting?: No Indigestion/heartburn?: No Diarrhea?: No Constipation?: No  Constitutional Fever: No Night sweats?: No Weight loss?: No Fatigue?: No  Skin Skin rash/lesions?: No Itching?: No  Eyes Blurred vision?: No Double vision?: No  Ears/Nose/Throat Sore throat?: No Sinus problems?: No  Hematologic/Lymphatic Swollen glands?: No Easy bruising?: No  Cardiovascular Leg swelling?: No Chest pain?: No  Respiratory Cough?: No Shortness of breath?: No  Endocrine Excessive thirst?: No  Musculoskeletal Back pain?: No Joint pain?: No  Neurological Headaches?: No Dizziness?: No  Psychologic Depression?: No Anxiety?: No  Physical Exam: BP (!) 152/97   Pulse (!) 102   Ht 5\' 7"  (1.702 m)   Wt 241 lb 8 oz (109.5 kg)   BMI 37.82 kg/m   Constitutional: Well nourished. Alert and oriented, No acute distress. HEENT: [place] AT, moist mucus membranes. Trachea midline, no masses. Cardiovascular: No clubbing, cyanosis, or edema. Respiratory: Normal respiratory effort, no increased  work of breathing. GI: Abdomen is soft, non tender, non distended, no abdominal masses. Liver and spleen not palpable.  No hernias appreciated.  Stool sample for occult testing is not indicated.   GU: No CVA tenderness.  No bladder fullness or masses.  Patient with circumcised phallus.  Urethral meatus is patent.  No penile discharge. No penile lesions or rashes. Scrotum without lesions, cysts, rashes and/or edema.  Testicles are located scrotally bilaterally. No masses are appreciated in the testicles. Left and right epididymis are normal. Rectal: Patient with  normal sphincter tone. Anus and perineum without scarring or rashes. No rectal masses are appreciated. Prostate is approximately 50 grams, no nodules are appreciated. Seminal vesicles are normal. Skin: No rashes, bruises or suspicious lesions. Lymph: No cervical or inguinal adenopathy. Neurologic: Grossly intact, no focal deficits, moving all 4 extremities. Psychiatric: Normal mood and affect.  Laboratory Data: PSA History  1.4 in 07/2015  1.3 in 07/2016  1.5 in 07/2017 Lab Results  Component Value Date   WBC 8.8 11/07/2016   HGB 13.6 11/07/2016   HCT 39.0 (L) 11/07/2016   MCV 87.8 11/07/2016   PLT 160 11/07/2016    Lab Results  Component Value Date   CREATININE 1.20 11/06/2016       Component Value Date/Time   CHOL 169 09/27/2016 1615   CHOL 175 01/04/2016 1144   HDL 59 09/27/2016 1615   HDL 57 01/04/2016 1144   CHOLHDL 2.9 09/27/2016 1615   VLDL 19 09/27/2016 1615   LDLCALC 91 09/27/2016 1615   LDLCALC 93 01/04/2016 1144    Lab Results  Component Value Date   AST 27 11/04/2016   Lab Results  Component Value Date   ALT 41 11/04/2016    Urinalysis    Component Value Date/Time   COLORURINE YELLOW (A) 11/04/2016 2114   APPEARANCEUR Clear 11/21/2016 1342   LABSPEC 1.017 11/04/2016 2114   PHURINE 6.0 11/04/2016 2114   GLUCOSEU Negative 11/21/2016 1342   HGBUR MODERATE (A)  11/04/2016 2114   BILIRUBINUR  Negative 11/21/2016 [place] 11/04/2016 2114   PROTEINUR Trace (A) 11/21/2016 1342   PROTEINUR 30 (A) 11/04/2016 2114   UROBILINOGEN 0.2 09/27/2016 1538   NITRITE Negative 11/21/2016 1342   NITRITE NEGATIVE 11/04/2016 2114   LEUKOCYTESUR 1+ (A) 11/21/2016 1342    I have reviewed the labs.   Pertinent Imaging: CLINICAL DATA:  LEFT lower quadrant pain, vomiting for 2 days, cystoscopy with stent placement and lithotripsy 5 days ago, past history of COPD, asthma, hypertension, kidney stones, 3 hernia repairs, coronary disease post MI, cholecystectomy, former smoker  EXAM: CT ABDOMEN AND PELVIS WITH CONTRAST  TECHNIQUE: Multidetector CT imaging of the abdomen and pelvis was performed using the standard protocol following bolus administration of intravenous contrast. Sagittal and coronal MPR images reconstructed from axial data set.  CONTRAST:  141mL ISOVUE-300 IOPAMIDOL (ISOVUE-300) INJECTION 61% IV. Dilute oral contrast.  COMPARISON:  09/26/2016  FINDINGS: Lower chest: Lung bases clear  Hepatobiliary: Gallbladder surgically absent. Fatty infiltration of liver. No focal hepatic abnormalities or biliary dilatation  Pancreas: Normal appearance  Spleen: Normal appearance  Adrenals/Urinary Tract: Adrenal glands normal appearance. BILATERAL renal cysts measuring up to 13.2 x 8.4 x 11.0 cm RIGHT and 6.6 x 5.9 x 6.5 cm LEFT. LEFT ureteral stent extends from renal pelvis into urinary bladder. Question tiny dependent calculi within urinary bladder. No definite calculi along the ureteral stent. No LEFT hydronephrosis or hydroureter. No solid renal masses or RIGHT hydronephrosis. Tiny BILATERAL nonobstructing renal calculi noted. Minimal LEFT perinephric edema. Minimal prostatic enlargement.  Stomach/Bowel: Stomach and bowel loops normal appearance. Normal appendix.  Vascular/Lymphatic: Circumaortic LEFT renal vein. Aorta normal caliber. No  adenopathy.  Reproductive: N/A  Other: No free air or free fluid. Suspect prior BILATERAL inguinal hernia repairs without recurrent hernia.  Musculoskeletal: Bones demineralized.  IMPRESSION: Tiny BILATERAL nonobstructing renal calculi.  LEFT ureteral stent without hydronephrosis or hydroureter.  Probable tiny dependent calculi within urinary bladder without definite visualization of her ureteral calculus.  BILATERAL renal cysts.  Fatty infiltration of liver.   Electronically Signed   By: Lavonia Dana M.D.   On: 11/04/2016 21:33 I have independently reviewed the films.    Assessment & Plan:    1. Bilateral nephrolithiasis  - recommend yearly KUB's  - Advised to contact our office or seek treatment in the ED if becomes febrile or pain/ vomiting are difficult control in order to arrange for emergent/urgent intervention  2. BPH with LUTS  - IPSS score is 13/2, it is worsening  - Continue conservative management, avoiding bladder irritants and timed voiding's  - most bothersome symptoms is/are intermittency and a weak urinary stream  - Continue finasteride 5 mg daily:refills given  - RTC in 12 months for IPSS, PSA, PVR and exam   Return in about 1 year (around 07/11/2018) for IPSS, KUB, PSA and exam.  These notes generated with voice recognition software. I apologize for typographical errors.  Zara Council, [facility] [address], [place], [place] [address] [phone]

## 2017-07-11 ENCOUNTER — Encounter: Payer: Self-pay | Admitting: Urology

## 2017-07-11 ENCOUNTER — Ambulatory Visit: Payer: Federal, State, Local not specified - PPO | Admitting: Urology

## 2017-07-11 VITALS — BP 152/97 | HR 102 | Ht 67.0 in | Wt 241.5 lb

## 2017-07-11 DIAGNOSIS — N138 Other obstructive and reflux uropathy: Secondary | ICD-10-CM | POA: Diagnosis not present

## 2017-07-11 DIAGNOSIS — N401 Enlarged prostate with lower urinary tract symptoms: Secondary | ICD-10-CM | POA: Diagnosis not present

## 2017-07-11 DIAGNOSIS — N200 Calculus of kidney: Secondary | ICD-10-CM

## 2017-07-11 MED ORDER — FINASTERIDE 5 MG PO TABS: 5.0000 mg | ORAL_TABLET | Freq: Every day | ORAL | 3 refills | Status: DC

## 2017-10-11 ENCOUNTER — Encounter: Payer: Self-pay | Admitting: Internal Medicine

## 2017-10-11 ENCOUNTER — Ambulatory Visit (INDEPENDENT_AMBULATORY_CARE_PROVIDER_SITE_OTHER): Payer: Federal, State, Local not specified - PPO | Admitting: Internal Medicine

## 2017-10-11 VITALS — BP 122/86 | HR 81 | Ht 67.0 in | Wt 243.0 lb

## 2017-10-11 DIAGNOSIS — J449 Chronic obstructive pulmonary disease, unspecified: Secondary | ICD-10-CM | POA: Diagnosis not present

## 2017-10-11 MED ORDER — ALBUTEROL SULFATE HFA 108 (90 BASE) MCG/ACT IN AERS: 2.0000 | INHALATION_SPRAY | RESPIRATORY_TRACT | 10 refills | Status: DC | PRN

## 2017-10-11 MED ORDER — SYMBICORT 160-4.5 MCG/ACT IN AERO: 2.0000 | INHALATION_SPRAY | Freq: Two times a day (BID) | RESPIRATORY_TRACT | 10 refills | Status: DC

## 2017-10-11 NOTE — Progress Notes (Signed)
Willie Crane      Date: 10/11/2017,   MRN# [id number] Willie Crane [birth year]     AdmissionWeight: 243 lb (110.2 kg)                 CurrentWeight: 243 lb (110.2 kg) Willie Crane is a 72 y.o. old male seen in [place] for cough/congestion at the request of Dr. Sanda Klein.  Previous history 72 yo white male with dx of ASTHMA in his 47's-he is [place] Vet Patient has been on inhaled steroids for many years. Patient has chronic fatigue and SOB and DOE for very long time Quit smoking 40 years ago Smoked 1 ppd for 20 years Worked as Gaffer, retired 5 years now Has gained 60 pounds in 34 years    CHIEF COMPLAINT:   Cough, follow up moderate COPD Follow up Moderate COPD  HISTORY OF PRESENT ILLNESS   patient now on Symbicort and doing well on it-breathing seems to be in control  no signs of infection at this time No signs of acute and severe COPD exacerbation at this time   wheezing is under control  no signs of CHF at this time   Patient refuses sleep study and also refuses to use oxygen if prescribed   Office Arlyce Harman shows ratio 68% Fev1 1.8L 64% fef25/75 50% Moderate Obstructive and Restrictive lung disease  Patient needs to lose weight    Current Medication:  Current Outpatient Medications:  .  acetaminophen (TYLENOL) 325 MG tablet, Take 2 tablets (650 mg total) by mouth every 6 (six) hours as needed for mild pain or fever., Disp: , Rfl:  .  albuterol (PROVENTIL HFA) 108 (90 Base) MCG/ACT inhaler, Inhale 2 puffs into the lungs every 4 (four) hours as needed for wheezing or shortness of breath., Disp: 3 Inhaler, Rfl: 1 .  Bioflavonoid Products (VITAMIN C) CHEW, Take 240 mgs once daily, Disp: , Rfl:  .  finasteride (PROSCAR) 5 MG tablet, Take 1 tablet (5 mg total) by mouth daily., Disp: 90 tablet, Rfl: 3 .  FLUoxetine (PROZAC) 20 MG capsule, TAKE 1 CAPSULE (20 MG TOTAL) BY MOUTH DAILY., Disp: 90 capsule, Rfl: 3 .  Ketotifen  Fumarate (ALLERGY EYE DROPS OP), Apply 1 drop to eye daily as needed (allergies)., Disp: , Rfl:  .  Multiple Vitamins-Minerals (MULTI ADULT GUMMIES) CHEW, Chew 1 tablet by mouth daily., Disp: , Rfl:  .  olmesartan (BENICAR) 20 MG tablet, TAKE 1 TABLET (20 MG TOTAL) BY MOUTH DAILY., Disp: 90 tablet, Rfl: 0    ALLERGIES   Other     REVIEW OF SYSTEMS   Review of Systems  Constitutional: Negative for chills, diaphoresis, fever, malaise/fatigue and weight loss.  HENT: Negative for congestion and hearing loss.   Eyes: Negative for blurred vision and double vision.  Respiratory: Negative for cough, hemoptysis, sputum production, shortness of breath and wheezing.   Cardiovascular: Negative for chest pain, palpitations, orthopnea and leg swelling.  Gastrointestinal: Negative for abdominal pain, heartburn, nausea and vomiting.  Skin: Negative for rash.  Neurological: Negative for weakness and headaches.  All other systems reviewed and are negative.    VS: Ht 5\' 7"  (1.702 m)   Wt 243 lb (110.2 kg)   BMI 38.06 kg/m      PHYSICAL EXAM  Physical Exam  Constitutional: He is oriented to person, place, and time. He appears well-developed and well-nourished. No distress.  HENT:  Mouth/Throat: No oropharyngeal exudate.  Neck: Neck supple.  Cardiovascular: Normal rate,  regular rhythm and normal heart sounds.  No murmur heard. Pulmonary/Chest: Effort normal and breath sounds normal. No stridor. No respiratory distress. He has no wheezes.  Musculoskeletal: Normal range of motion. He exhibits no edema.  Neurological: He is alert and oriented to person, place, and time.  Skin: Skin is warm. He is not diaphoretic.  Psychiatric: He has a normal mood and affect.       ASSESSMENT/PLAN   72 yo pleasant overweight white male with Moderate COPD Gold Stage B with signs and symptoms of sleep apnea Patient refuses sleep study, and also refuses to use oxygen if prescribed,  Patient with recent  acute COPD exacerbation but is  Not in distress at this time  1.contineu SYmbicort as prescribed 2.albuterol as needed 3. Patient advised to call us for any respiratory issues 4.lose weight-goal at next visit to be around 230 pounds Patient weighs 243 pounds now 3.8.19  Follow up in 6 months    Patient satisfied with Plan of action and management. All questions answered  Corrin Parker, M.D.  [facility]  Medical Director [place] Director [facility] Cardio-Pulmonary Department

## 2017-10-11 NOTE — Patient Instructions (Signed)
Continue inhalers as prescribed 

## 2017-10-18 ENCOUNTER — Telehealth: Payer: Self-pay | Admitting: Internal Medicine

## 2017-10-18 NOTE — Telephone Encounter (Signed)
Pt calling to verify how we sent in his inhalers, for we may have sent It in wrong    He is asking if we could please send in Symbicort to [facility] mail order  Albuterol is to be sent to [facility] on [address]  Please advise

## 2017-10-21 ENCOUNTER — Other Ambulatory Visit: Payer: Self-pay | Admitting: Internal Medicine

## 2017-10-21 MED ORDER — SYMBICORT 160-4.5 MCG/ACT IN AERO: 2.0000 | INHALATION_SPRAY | Freq: Two times a day (BID) | RESPIRATORY_TRACT | 2 refills | Status: DC

## 2017-10-21 NOTE — Telephone Encounter (Signed)
Symbicort has been sent to [facility]. Albuterol has already been sent to [place].

## 2017-10-21 NOTE — Telephone Encounter (Signed)
Pt aware.

## 2017-12-16 ENCOUNTER — Telehealth: Payer: Self-pay | Admitting: Family Medicine

## 2017-12-18 NOTE — Telephone Encounter (Signed)
Appt made for 01-13-18 with Dr Sanda Klein

## 2017-12-18 NOTE — Telephone Encounter (Signed)
I have not seen the patient is over a year Please ask him to schedule an appointment and we'll see him soon I'll refill the medicine this time

## 2017-12-31 ENCOUNTER — Other Ambulatory Visit: Payer: Self-pay | Admitting: Family Medicine

## 2018-01-13 ENCOUNTER — Encounter: Payer: Self-pay | Admitting: Family Medicine

## 2018-01-13 ENCOUNTER — Ambulatory Visit: Payer: Federal, State, Local not specified - PPO | Admitting: Family Medicine

## 2018-01-13 VITALS — BP 138/84 | HR 92 | Temp 98.2°F | Resp 14 | Ht 67.0 in | Wt 245.0 lb

## 2018-01-13 DIAGNOSIS — I10 Essential (primary) hypertension: Secondary | ICD-10-CM | POA: Diagnosis not present

## 2018-01-13 DIAGNOSIS — N138 Other obstructive and reflux uropathy: Secondary | ICD-10-CM | POA: Diagnosis not present

## 2018-01-13 DIAGNOSIS — Z5181 Encounter for therapeutic drug level monitoring: Secondary | ICD-10-CM | POA: Diagnosis not present

## 2018-01-13 DIAGNOSIS — L989 Disorder of the skin and subcutaneous tissue, unspecified: Secondary | ICD-10-CM

## 2018-01-13 DIAGNOSIS — E782 Mixed hyperlipidemia: Secondary | ICD-10-CM

## 2018-01-13 DIAGNOSIS — Z6838 Body mass index (BMI) 38.0-38.9, adult: Secondary | ICD-10-CM | POA: Diagnosis not present

## 2018-01-13 DIAGNOSIS — Z23 Encounter for immunization: Secondary | ICD-10-CM

## 2018-01-13 DIAGNOSIS — G4700 Insomnia, unspecified: Secondary | ICD-10-CM | POA: Diagnosis not present

## 2018-01-13 DIAGNOSIS — E6609 Other obesity due to excess calories: Secondary | ICD-10-CM | POA: Diagnosis not present

## 2018-01-13 DIAGNOSIS — N401 Enlarged prostate with lower urinary tract symptoms: Secondary | ICD-10-CM

## 2018-01-13 DIAGNOSIS — Z1211 Encounter for screening for malignant neoplasm of colon: Secondary | ICD-10-CM | POA: Diagnosis not present

## 2018-01-13 MED ORDER — TRIAMCINOLONE ACETONIDE 0.5 % EX OINT: 1.0000 "application " | TOPICAL_OINTMENT | Freq: Two times a day (BID) | CUTANEOUS | 2 refills | Status: DC

## 2018-01-13 MED ORDER — TRAZODONE HCL 50 MG PO TABS: 25.0000 mg | ORAL_TABLET | Freq: Every evening | ORAL | 5 refills | Status: DC | PRN

## 2018-01-13 NOTE — Progress Notes (Signed)
BP 138/84 (BP Location: Left Arm, Cuff Size: Large)   Pulse 92   Temp 98.2 F (36.8 C) (Oral)   Resp 14   Ht 5\' 7"  (1.702 m)   Wt 245 lb (111.1 kg)   SpO2 95%   BMI 38.37 kg/m    Subjective:    Patient ID: Willie Crane, male    DOB: [birth year], 72 y.o.   MRN: [id number]  HPI: Willie Crane is a 72 y.o. male  Chief Complaint  Patient presents with  . Follow-up  . Medication Refill  . Insomnia    trouble falling asleep    HPI Patient is here for f/u Hypertension; nothing in particular going on stress wise; not a lot of salt; hx of low K+ and low Mg2+ He has insomnia; trouble falling asleep; he says that since he was little, he slept well during the day, but nightmares at night; going on more then last few years; less energy, less get up and go; "I'm way overweight" Poor sleep quality; does not want to use mask Does not want to see the nutritionist; he says he just wants to get his act together; he's 39 and it's a lot harder He used to take [place], but learned it was addictive and weaned off COPD; did okay with the pollen, not affected like he was up [facility] urologist for prostate issues; on proscar He has multiple lesions on the arms; nothing on the back or belly; they will come and go, will "dry up"; washes with head and shoulders shampoo; exposed to agent orange in late 1960's  Depression screen [facility] 2/9 01/13/2018 11/16/2016 09/27/2016 12/26/2015 10/21/2015  Decreased Interest 0 0 0 0 0  Down, Depressed, Hopeless 1 0 0 0 0  PHQ - 2 Score 1 0 0 0 0  Altered sleeping 3 - - - -  Tired, decreased energy 1 - - - -  Change in appetite 1 - - - -  Feeling bad or failure about yourself  0 - - - -  Trouble concentrating 1 - - - -  Moving slowly or fidgety/restless 0 - - - -  Suicidal thoughts 0 - - - -  PHQ-9 Score 7 - - - -  Difficult doing work/chores Not difficult at all - - - -    Relevant past medical, surgical, family and social history reviewed Past Medical  History:  Diagnosis Date  . Anxiety   . Asthma   . Benign paroxysmal positional vertigo   . BPH (benign prostatic hypertrophy)   . Complication of anesthesia   . COPD (chronic obstructive pulmonary disease) ([place])   . Coronary artery disease    history of heart attack  . Degeneration of lumbar or lumbosacral intervertebral disc   . History of kidney stones   . Hyperlipidemia   . Hypertension   . MI (myocardial infarction) ([place])    1980s  . Obesity, unspecified   . PONV (postoperative nausea and vomiting)   . PTSD (post-traumatic stress disorder)   . Renal disorder    kidney stones  . Thoracic or lumbosacral neuritis or radiculitis, unspecified   . Unspecified congenital anomaly of the integument   . Unspecified gastritis and gastroduodenitis without mention of hemorrhage    Past Surgical History:  Procedure Laterality Date  . BACK SURGERY    . CARDIAC CATHETERIZATION  03/2009   [facility]: No significant coronary artery disease  . CARDIAC CATHETERIZATION  01/2014   [facility]  . CARPAL  TUNNEL RELEASE Right   . CHOLECYSTECTOMY    . COLONOSCOPY    . COLONOSCOPY WITH PROPOFOL N/A 10/31/2015   Procedure: COLONOSCOPY WITH PROPOFOL;  Surgeon: Lucilla Lame, MD;  Location: [facility];  Service: Endoscopy;  Laterality: N/A;  . CYST EXCISION    . CYSTOSCOPY W/ URETERAL STENT PLACEMENT Left 10/26/2016   Procedure: CYSTOSCOPY WITH STENT REPLACEMENT;  Surgeon: Nickie Retort, MD;  Location: [facility];  Service: Urology;  Laterality: Left;  . CYSTOSCOPY WITH STENT PLACEMENT Left 10/05/2016   Procedure: CYSTOSCOPY WITH STENT PLACEMENT;  Surgeon: Nickie Retort, MD;  Location: [facility];  Service: Urology;  Laterality: Left;  . HERNIA REPAIR    . INGUINAL HERNIA REPAIR Right   . INGUINAL HERNIA REPAIR Left 01/26/2016   Procedure: HERNIA REPAIR INGUINAL ADULT;  Surgeon: Christene Lye, MD;  Location: [facility];  Service: General;  Laterality: Left;  . KNEE ARTHROSCOPY    . LUMBAR  LAMINECTOMY    . POLYPECTOMY  10/31/2015   Procedure: POLYPECTOMY;  Surgeon: Lucilla Lame, MD;  Location: [place];  Service: Endoscopy;;  . URETEROSCOPY Left 10/05/2016   Procedure: URETEROSCOPY;  Surgeon: Nickie Retort, MD;  Location: [facility];  Service: Urology;  Laterality: Left;  . URETEROSCOPY WITH HOLMIUM LASER LITHOTRIPSY Left 10/26/2016   Procedure: URETEROSCOPY WITH HOLMIUM LASER LITHOTRIPSY;  Surgeon: Nickie Retort, MD;  Location: [facility];  Service: Urology;  Laterality: Left;  Marland Kitchen VASECTOMY     Family History  Problem Relation Age of Onset  . Kidney failure Mother   . Cancer Mother   . Cancer Father        lung  . Prostate cancer Neg Hx    Social History   Tobacco Use  . Smoking status: Former Smoker    Packs/day: 1.00    Years: 20.00    Pack years: 20.00    Types: Cigarettes    Last attempt to quit: 01/19/1979    Years since quitting: 39.0  . Smokeless tobacco: Former Systems developer    Types: Chew  Substance Use Topics  . Alcohol use: Yes    Alcohol/week: 0.0 oz    Comment: Occasional Wine  . Drug use: No    Interim medical history since last visit reviewed. Allergies and medications reviewed  Review of Systems Per HPI unless specifically indicated above     Objective:    BP 138/84 (BP Location: Left Arm, Cuff Size: Large)   Pulse 92   Temp 98.2 F (36.8 C) (Oral)   Resp 14   Ht 5\' 7"  (1.702 m)   Wt 245 lb (111.1 kg)   SpO2 95%   BMI 38.37 kg/m   Wt Readings from Last 3 Encounters:  01/13/18 245 lb (111.1 kg)  10/11/17 243 lb (110.2 kg)  07/11/17 241 lb 8 oz (109.5 kg)    Physical Exam  Constitutional: He appears well-developed and well-nourished. No distress.  HENT:  Head: Normocephalic and atraumatic.  Eyes: EOM are normal. No scleral icterus.  Neck: No thyromegaly present.  Cardiovascular: Normal rate and regular rhythm.  Pulmonary/Chest: Effort normal and breath sounds normal.  Abdominal: Soft. Bowel sounds are normal. He  exhibits no distension.  Musculoskeletal: He exhibits no edema.  Neurological: Coordination normal.  Skin: Skin is warm and dry. Lesion (multiple lesions on the arms; different stages, some scars, some which appear scabbed or ulcerated) noted. No pallor.  Psychiatric: He has a normal mood and affect. His behavior is normal. Judgment and  thought content normal.    Results for orders placed or performed in visit on 07/09/17  PSA  Result Value Ref Range   Prostate Specific Ag, Serum 1.5 0.0 - 4.0 ng/mL      Assessment & Plan:   Problem List Items Addressed This Visit      Cardiovascular and Mediastinum   Hypertension - Primary    Fair control; DASH guidelines, weight loss recommended        Genitourinary   BPH with obstruction/lower urinary tract symptoms    Managed by urologist        Other   Obesity    Encouragement givenfor weight loss      Medication monitoring encounter    Check labs      Relevant Orders   COMPLETE METABOLIC PANEL WITH GFR   Hypomagnesemia    Check Mg2+      Relevant Orders   Magnesium   Hyperlipidemia    Check lipids; weight loss and healthy eating encouraged      Relevant Orders   Lipid panel    Other Visit Diagnoses    Skin lesions       don't think this is dermatitis herpetiformis, but in ddx; exposed to agent orange; derm referral discussed   Insomnia, unspecified type       Screen for colon cancer       Relevant Orders   Hepatitis C Antibody   Need for vaccination with 13-polyvalent pneumococcal conjugate vaccine       Relevant Orders   Pneumococcal conjugate vaccine 13-valent IM (Completed)       Follow up plan: Return in about 6 months (around 07/15/2018).  An after-visit summary was printed and given to the patient at [place].  Please see the patient instructions which may contain other information and recommendations beyond what is mentioned above in the assessment and plan.  Meds ordered this encounter    Medications  . traZODone (DESYREL) 50 MG tablet    Sig: Take 0.5-1 tablets (25-50 mg total) by mouth at bedtime as needed for sleep.    Dispense:  30 tablet    Refill:  5  . triamcinolone ointment (KENALOG) 0.5 %    Sig: Apply 1 application topically 2 (two) times daily.    Dispense:  30 g    Refill:  2    Orders Placed This Encounter  Procedures  . Pneumococcal conjugate vaccine 13-valent IM  . Hepatitis C Antibody  . COMPLETE METABOLIC PANEL WITH GFR  . Lipid panel  . Magnesium

## 2018-01-13 NOTE — Patient Instructions (Addendum)
Return for labs on another day this week Try to follow the DASH guidelines (DASH stands for Dietary Approaches to Stop Hypertension). Try to limit the sodium in your diet to no more than 1,500mg  of sodium per day. Certainly try to not exceed 2,000 mg per day at the very most. Do not add salt when cooking or at the table.  Check the sodium amount on labels when shopping, and choose items lower in sodium when given a choice. Avoid or limit foods that already contain a lot of sodium. Eat a diet rich in fruits and vegetables and whole grains, and try to lose weight if overweight or obese Try to lose 15 pounds over the next 6 months   DASH Eating Plan DASH stands for "Dietary Approaches to Stop Hypertension." The DASH eating plan is a healthy eating plan that has been shown to reduce high blood pressure (hypertension). It may also reduce your risk for type 2 diabetes, heart disease, and stroke. The DASH eating plan may also help with weight loss. What are tips for following this plan? General guidelines  Avoid eating more than 2,300 mg (milligrams) of salt (sodium) a day. If you have hypertension, you may need to reduce your sodium intake to 1,500 mg a day.  Limit alcohol intake to no more than 1 drink a day for nonpregnant women and 2 drinks a day for men. One drink equals 12 oz of beer, 5 oz of wine, or 1 oz of hard liquor.  Work with your health care provider to maintain a healthy body weight or to lose weight. Ask what an ideal weight is for you.  Get at least 30 minutes of exercise that causes your heart to beat faster (aerobic exercise) most days of the week. Activities may include walking, swimming, or biking.  Work with your health care provider or diet and nutrition specialist (dietitian) to adjust your eating plan to your individual calorie needs. Reading food labels  Check food labels for the amount of sodium per serving. Choose foods with less than 5 percent of the Daily Value of sodium.  Generally, foods with less than 300 mg of sodium per serving fit into this eating plan.  To find whole grains, look for the word "whole" as the first word in the ingredient list. Shopping  Buy products labeled as "low-sodium" or "no salt added."  Buy fresh foods. Avoid canned foods and premade or frozen meals. Cooking  Avoid adding salt when cooking. Use salt-free seasonings or herbs instead of table salt or sea salt. Check with your health care provider or pharmacist before using salt substitutes.  Do not fry foods. Cook foods using healthy methods such as baking, boiling, grilling, and broiling instead.  Cook with heart-healthy oils, such as olive, canola, soybean, or sunflower oil. Meal planning   Eat a balanced diet that includes: ? 5 or more servings of fruits and vegetables each day. At each meal, try to fill half of your plate with fruits and vegetables. ? Up to 6-8 servings of whole grains each day. ? Less than 6 oz of lean meat, poultry, or fish each day. A 3-oz serving of meat is about the same size as a deck of cards. One egg equals 1 oz. ? 2 servings of low-fat dairy each day. ? A serving of nuts, seeds, or beans 5 times each week. ? Heart-healthy fats. Healthy fats called Omega-3 fatty acids are found in foods such as flaxseeds and coldwater fish, like sardines, salmon,  and mackerel.  Limit how much you eat of the following: ? Canned or prepackaged foods. ? Food that is high in trans fat, such as fried foods. ? Food that is high in saturated fat, such as fatty meat. ? Sweets, desserts, sugary drinks, and other foods with added sugar. ? Full-fat dairy products.  Do not salt foods before eating.  Try to eat at least 2 vegetarian meals each week.  Eat more home-cooked food and less restaurant, buffet, and fast food.  When eating at a restaurant, ask that your food be prepared with less salt or no salt, if possible. What foods are recommended? The items listed may  not be a complete list. Talk with your dietitian about what dietary choices are best for you. Grains Whole-grain or whole-wheat bread. Whole-grain or whole-wheat pasta. Brown rice. [place]. Bulgur. Whole-grain and low-sodium cereals. Pita bread. Low-fat, low-sodium crackers. Whole-wheat flour tortillas. Vegetables Fresh or frozen vegetables (raw, steamed, roasted, or grilled). Low-sodium or reduced-sodium tomato and vegetable juice. Low-sodium or reduced-sodium tomato sauce and tomato paste. Low-sodium or reduced-sodium canned vegetables. Fruits All fresh, dried, or frozen fruit. Canned fruit in natural juice (without added sugar). Meat and other protein foods Skinless chicken or [place]. Ground chicken or [place]. Pork with fat trimmed off. Fish and seafood. Egg whites. Dried beans, peas, or lentils. Unsalted nuts, nut butters, and seeds. Unsalted canned beans. Lean cuts of beef with fat trimmed off. Low-sodium, lean deli meat. Dairy Low-fat (1%) or fat-free (skim) milk. Fat-free, low-fat, or reduced-fat cheeses. Nonfat, low-sodium ricotta or cottage cheese. Low-fat or nonfat yogurt. Low-fat, low-sodium cheese. Fats and oils Soft margarine without trans fats. Vegetable oil. Low-fat, reduced-fat, or light mayonnaise and salad dressings (reduced-sodium). Canola, safflower, olive, soybean, and sunflower oils. Avocado. Seasoning and other foods Herbs. Spices. Seasoning mixes without salt. Unsalted popcorn and pretzels. Fat-free sweets. What foods are not recommended? The items listed may not be a complete list. Talk with your dietitian about what dietary choices are best for you. Grains Baked goods made with fat, such as croissants, muffins, or some breads. Dry pasta or rice meal packs. Vegetables Creamed or fried vegetables. Vegetables in a cheese sauce. Regular canned vegetables (not low-sodium or reduced-sodium). Regular canned tomato sauce and paste (not low-sodium or reduced-sodium).  Regular tomato and vegetable juice (not low-sodium or reduced-sodium). Angie Fava. Olives. Fruits Canned fruit in a light or heavy syrup. Fried fruit. Fruit in cream or butter sauce. Meat and other protein foods Fatty cuts of meat. Ribs. Fried meat. Berniece Salines. Sausage. Bologna and other processed lunch meats. Salami. Fatback. Hotdogs. Bratwurst. Salted nuts and seeds. Canned beans with added salt. Canned or smoked fish. Whole eggs or egg yolks. Chicken or [place] with skin. Dairy Whole or 2% milk, cream, and half-and-half. Whole or full-fat cream cheese. Whole-fat or sweetened yogurt. Full-fat cheese. Nondairy creamers. Whipped toppings. Processed cheese and cheese spreads. Fats and oils Butter. Stick margarine. Lard. Shortening. Ghee. Bacon fat. Tropical oils, such as coconut, palm kernel, or palm oil. Seasoning and other foods Salted popcorn and pretzels. Onion salt, garlic salt, seasoned salt, table salt, and sea salt. Worcestershire sauce. Tartar sauce. Barbecue sauce. Teriyaki sauce. Soy sauce, including reduced-sodium. Steak sauce. Canned and packaged gravies. Fish sauce. Oyster sauce. Cocktail sauce. Horseradish that you find on the shelf. Ketchup. Mustard. Meat flavorings and tenderizers. Bouillon cubes. Hot sauce and Tabasco sauce. Premade or packaged marinades. Premade or packaged taco seasonings. Relishes. Regular salad dressings. Where to find more information:  National Heart,  Lung, and Blood Institute: https://wilson-eaton.com/  American Heart Association: www.heart.org Summary  The DASH eating plan is a healthy eating plan that has been shown to reduce high blood pressure (hypertension). It may also reduce your risk for type 2 diabetes, heart disease, and stroke.  With the DASH eating plan, you should limit salt (sodium) intake to 2,300 mg a day. If you have hypertension, you may need to reduce your sodium intake to 1,500 mg a day.  When on the DASH eating plan, aim to eat more fresh fruits and  vegetables, whole grains, lean proteins, low-fat dairy, and heart-healthy fats.  Work with your health care provider or diet and nutrition specialist (dietitian) to adjust your eating plan to your individual calorie needs. This information is not intended to replace advice given to you by your health care provider. Make sure you discuss any questions you have with your health care provider. Document Released: 07/12/2011 Document Revised: 07/16/2016 Document Reviewed: 07/16/2016 Elsevier Interactive Patient Education  2018 [employer]. Insomnia Insomnia is a sleep disorder that makes it difficult to fall asleep or to stay asleep. Insomnia can cause tiredness (fatigue), low energy, difficulty concentrating, mood swings, and poor performance at work or school. There are three different ways to classify insomnia:  Difficulty falling asleep.  Difficulty staying asleep.  Waking up too early in the morning.  Any type of insomnia can be long-term (chronic) or short-term (acute). Both are common. Short-term insomnia usually lasts for three months or less. Chronic insomnia occurs at least three times a week for longer than three months. What are the causes? Insomnia may be caused by another condition, situation, or substance, such as:  Anxiety.  Certain medicines.  Gastroesophageal reflux disease (GERD) or other gastrointestinal conditions.  Asthma or other breathing conditions.  Restless legs syndrome, sleep apnea, or other sleep disorders.  Chronic pain.  Menopause. This may include hot flashes.  Stroke.  Abuse of alcohol, tobacco, or illegal drugs.  Depression.  Caffeine.  Neurological disorders, such as Alzheimer disease.  An overactive thyroid (hyperthyroidism).  The cause of insomnia may not be known. What increases the risk? Risk factors for insomnia include:  Gender. Women are more commonly affected than men.  Age. Insomnia is more common as you get  older.  Stress. This may involve your professional or personal life.  Income. Insomnia is more common in people with lower income.  Lack of exercise.  Irregular work schedule or night shifts.  Traveling between different time zones.  What are the signs or symptoms? If you have insomnia, trouble falling asleep or trouble staying asleep is the main symptom. This may lead to other symptoms, such as:  Feeling fatigued.  Feeling nervous about going to sleep.  Not feeling rested in the morning.  Having trouble concentrating.  Feeling irritable, anxious, or depressed.  How is this treated? Treatment for insomnia depends on the cause. If your insomnia is caused by an underlying condition, treatment will focus on addressing the condition. Treatment may also include:  Medicines to help you sleep.  Counseling or therapy.  Lifestyle adjustments.  Follow these instructions at home:  Take medicines only as directed by your health care provider.  Keep regular sleeping and waking hours. Avoid naps.  Keep a sleep diary to help you and your health care provider figure out what could be causing your insomnia. Include: ? When you sleep. ? When you wake up during the night. ? How well you sleep. ? How rested you feel  the next day. ? Any side effects of medicines you are taking. ? What you eat and drink.  Make your bedroom a comfortable place where it is easy to fall asleep: ? Put up shades or special blackout curtains to block light from outside. ? Use a white noise machine to block noise. ? Keep the temperature cool.  Exercise regularly as directed by your health care provider. Avoid exercising right before bedtime.  Use relaxation techniques to manage stress. Ask your health care provider to suggest some techniques that may work well for you. These may include: ? Breathing exercises. ? Routines to release muscle tension. ? Visualizing peaceful scenes.  Cut back on alcohol,  caffeinated beverages, and cigarettes, especially close to bedtime. These can disrupt your sleep.  Do not overeat or eat spicy foods right before bedtime. This can lead to digestive discomfort that can make it hard for you to sleep.  Limit screen use before bedtime. This includes: ? Watching TV. ? Using your smartphone, tablet, and computer.  Stick to a routine. This can help you fall asleep faster. Try to do a quiet activity, brush your teeth, and go to bed at the same time each night.  Get out of bed if you are still awake after 15 minutes of trying to sleep. Keep the lights down, but try reading or doing a quiet activity. When you feel sleepy, go back to bed.  Make sure that you drive carefully. Avoid driving if you feel very sleepy.  Keep all follow-up appointments as directed by your health care provider. This is important. Contact a health care provider if:  You are tired throughout the day or have trouble in your daily routine due to sleepiness.  You continue to have sleep problems or your sleep problems get worse. Get help right away if:  You have serious thoughts about hurting yourself or someone else. This information is not intended to replace advice given to you by your health care provider. Make sure you discuss any questions you have with your health care provider. Document Released: 07/20/2000 Document Revised: 12/23/2015 Document Reviewed: 04/23/2014 Elsevier Interactive Patient Education  [employer].

## 2018-01-21 NOTE — Assessment & Plan Note (Signed)
Fair control; DASH guidelines, weight loss recommended

## 2018-01-21 NOTE — Assessment & Plan Note (Signed)
Managed by urologist 

## 2018-01-21 NOTE — Assessment & Plan Note (Signed)
Check labs 

## 2018-01-21 NOTE — Assessment & Plan Note (Signed)
Encouragement given for weight loss 

## 2018-01-21 NOTE — Assessment & Plan Note (Signed)
Check lipids; weight loss and healthy eating encouraged

## 2018-01-21 NOTE — Assessment & Plan Note (Signed)
Check Mg2+ 

## 2018-01-31 ENCOUNTER — Other Ambulatory Visit: Payer: Self-pay | Admitting: Family Medicine

## 2018-01-31 NOTE — Telephone Encounter (Signed)
Patient is overdue for labs I approved 7 pills to allow him time to come in for labs He was asked on 01/13/18 to come in for labs and I just don't have those results back yet

## 2018-02-05 ENCOUNTER — Other Ambulatory Visit: Payer: Self-pay | Admitting: Family Medicine

## 2018-02-05 NOTE — Telephone Encounter (Signed)
I prescribed a six month supply of trazodone to same pharmacy in June Too soon for Rx Denied  traZODone (DESYREL) 50 MG tablet 25-50 mg, At bedtime PRN 5 ordered        Summary: Take 0.5-1 tablets (25-50 mg total) by mouth at bedtime as needed for sleep., Starting Mon 01/13/2018, Normal  Dose, Route, Frequency: 25-50 mg, Oral, At bedtime PRN Start: 01/13/2018 Ord/Sold: 01/13/2018 (O) Report Taking:  Long-term:  Pharmacy: [id number] - [place], [place] Dose History ChangeDiscontinue     Patient Sig: Take 0.5-1 tablets (25-50 mg total) by mouth at bedtime as needed for sleep.     Ordered on: 01/13/2018     Authorized by: Kashvi Prevette P     Dispense: 30 tablet

## 2018-03-11 ENCOUNTER — Other Ambulatory Visit: Payer: Self-pay | Admitting: Family Medicine

## 2018-03-20 ENCOUNTER — Other Ambulatory Visit: Payer: Self-pay | Admitting: Family Medicine

## 2018-03-24 ENCOUNTER — Other Ambulatory Visit: Payer: Self-pay | Admitting: Family Medicine

## 2018-03-24 MED ORDER — OLMESARTAN MEDOXOMIL 20 MG PO TABS: 20.0000 mg | ORAL_TABLET | Freq: Every day | ORAL | 0 refills | Status: DC

## 2018-03-24 NOTE — Telephone Encounter (Signed)
Lab should be resulted in the morning

## 2018-03-24 NOTE — Telephone Encounter (Signed)
Copied from [place] [id number]. Topic: Quick Communication - Rx Refill/Question >> Mar 24, 2018 11:08 AM Scherrie Gerlach wrote: Medication: olmesartan (BENICAR) 20 MG tablet 90 day [id number] [address], [place] [phone] (Phone) [phone] (Fax)  (Wife states they did call the pharmacy.)

## 2018-03-24 NOTE — Telephone Encounter (Signed)
pT COMING IN NOW TO GET BLOOD WORK

## 2018-03-25 ENCOUNTER — Other Ambulatory Visit: Payer: Self-pay | Admitting: Family Medicine

## 2018-03-25 DIAGNOSIS — E782 Mixed hyperlipidemia: Secondary | ICD-10-CM

## 2018-03-25 LAB — COMPLETE METABOLIC PANEL WITH GFR
AG RATIO: 1.9 (calc) (ref 1.0–2.5)
ALBUMIN MSPROF: 4.1 g/dL (ref 3.6–5.1)
ALT: 23 U/L (ref 9–46)
AST: 18 U/L (ref 10–35)
Alkaline phosphatase (APISO): 86 U/L (ref 40–115)
BUN: 16 mg/dL (ref 7–25)
CO2: 25 mmol/L (ref 20–32)
Calcium: 9.2 mg/dL (ref 8.6–10.3)
Chloride: 106 mmol/L (ref 98–110)
Creat: 1.11 mg/dL (ref 0.70–1.18)
GFR, EST AFRICAN AMERICAN: 76 mL/min/{1.73_m2} (ref >=60)
GFR, Est Non African American: 66 mL/min/{1.73_m2} (ref >=60)
GLOBULIN: 2.2 g/dL (ref 1.9–3.7)
Glucose, Bld: 92 mg/dL (ref 65–139)
POTASSIUM: 4.1 mmol/L (ref 3.5–5.3)
SODIUM: 140 mmol/L (ref 135–146)
TOTAL PROTEIN: 6.3 g/dL (ref 6.1–8.1)
Total Bilirubin: 0.3 mg/dL (ref 0.2–1.2)

## 2018-03-25 LAB — MAGNESIUM: MAGNESIUM: 1.7 mg/dL (ref 1.5–2.5)

## 2018-03-25 LAB — LIPID PANEL
Cholesterol: 181 mg/dL (ref <200)
HDL: 58 mg/dL (ref >40)
LDL Cholesterol (Calc): 100 mg/dL (calc) — ABNORMAL HIGH
Non-HDL Cholesterol (Calc): 123 mg/dL (calc) (ref <130)
Total CHOL/HDL Ratio: 3.1 (calc) (ref <5.0)
Triglycerides: 135 mg/dL (ref <150)

## 2018-03-25 LAB — HEPATITIS C ANTIBODY
HEP C AB: NONREACTIVE
SIGNAL TO CUT-OFF: 0.01 (ref <1.00)

## 2018-03-25 MED ORDER — OLMESARTAN MEDOXOMIL 20 MG PO TABS: 20.0000 mg | ORAL_TABLET | Freq: Every day | ORAL | 3 refills | Status: DC

## 2018-03-25 MED ORDER — ATORVASTATIN CALCIUM 10 MG PO TABS: 10.0000 mg | ORAL_TABLET | Freq: Every day | ORAL | 1 refills | Status: DC

## 2018-03-25 NOTE — Progress Notes (Signed)
Rx approved for one year after seeing labs

## 2018-03-25 NOTE — Progress Notes (Signed)
Start statin; recheck lipids in 6-8 weeks 

## 2018-04-17 ENCOUNTER — Other Ambulatory Visit: Payer: Self-pay | Admitting: Family Medicine

## 2018-04-17 DIAGNOSIS — E782 Mixed hyperlipidemia: Secondary | ICD-10-CM

## 2018-04-17 NOTE — Telephone Encounter (Signed)
Pt.notified

## 2018-04-17 NOTE — Telephone Encounter (Signed)
Patient should not be out of atorvastatin He'll be due for labs before he runs out of the refill Thank you

## 2018-04-19 IMAGING — CT CT CHEST W/O CM
2 of 3 series · 15 of 36 positions shown, 18 images · non-contrast
Comparison: Abdominal CT 09/26/2016

CLINICAL DATA: Abnormal finding on CT 09/26/2016.

EXAM:
CT CHEST WITHOUT CONTRAST
TECHNIQUE: Multidetector CT imaging of the chest was performed following the
standard protocol without IV contrast.

[Series 2: thorax · axial · 0.81mm/px · z∈[-340,-90]mm · 12 of 147 slices shown, 15 images]
[im 11/147  mediastinal]
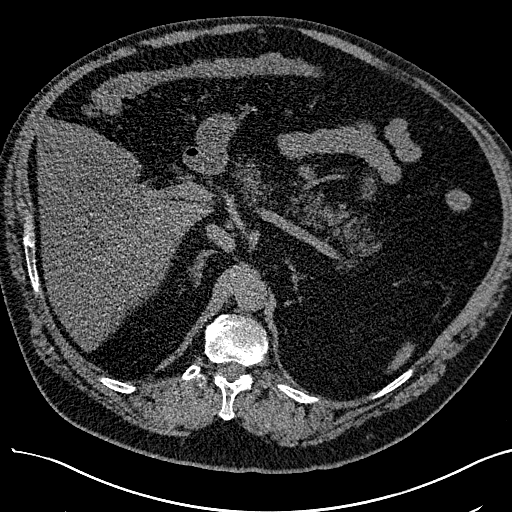
[im 11/147  lung]
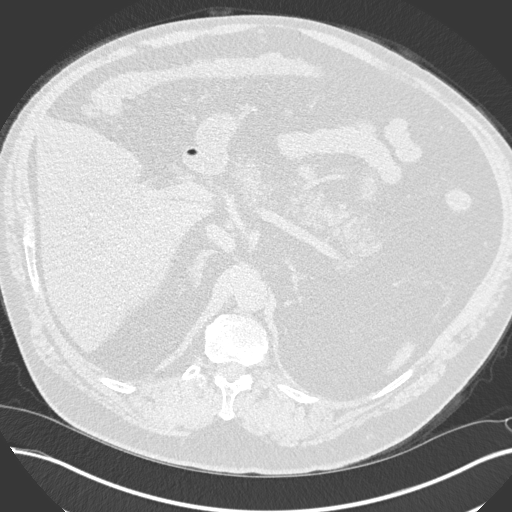
[im 22/147  lung]
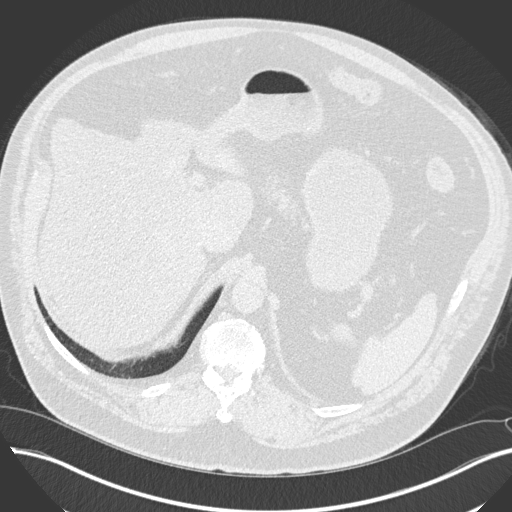
[im 33/147  lung]
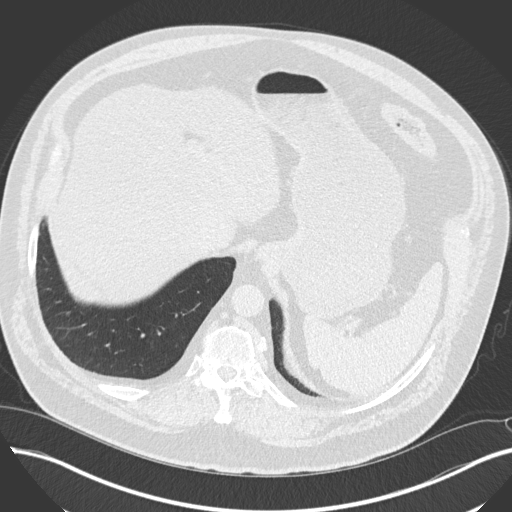
[im 44/147  lung]
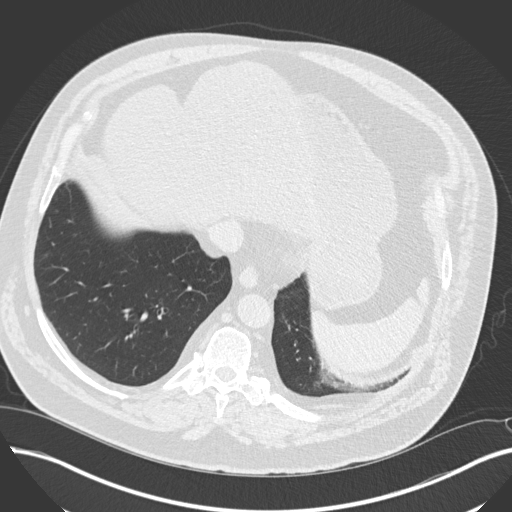
[im 55/147  mediastinal]
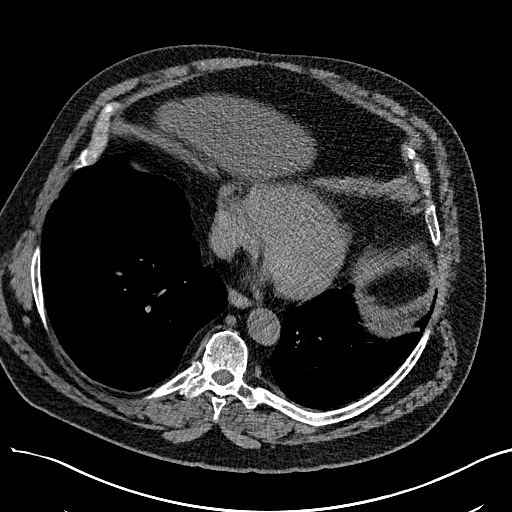
[im 55/147  lung]
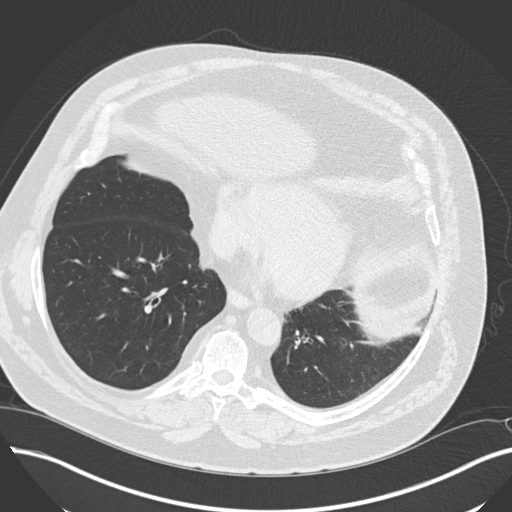
[im 65/147  lung]
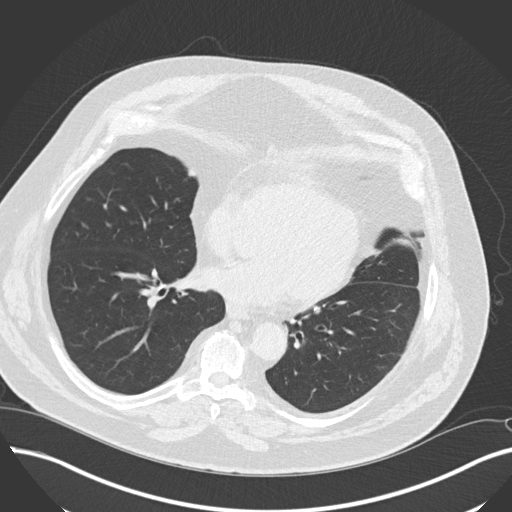
[im 82/147  lung]
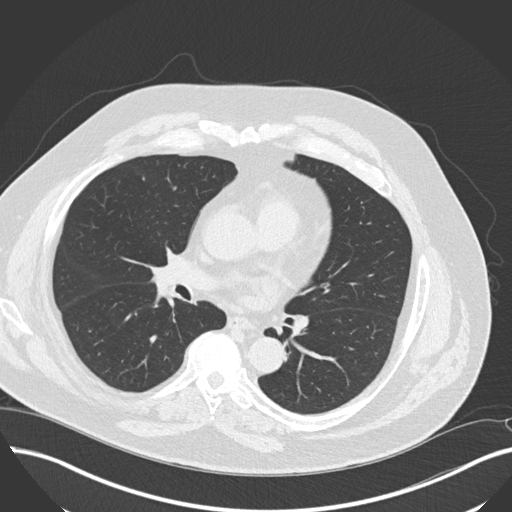
[im 92/147  lung]
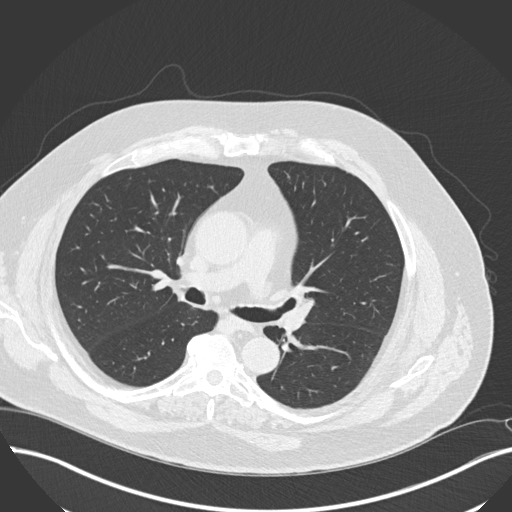
[im 103/147  mediastinal]
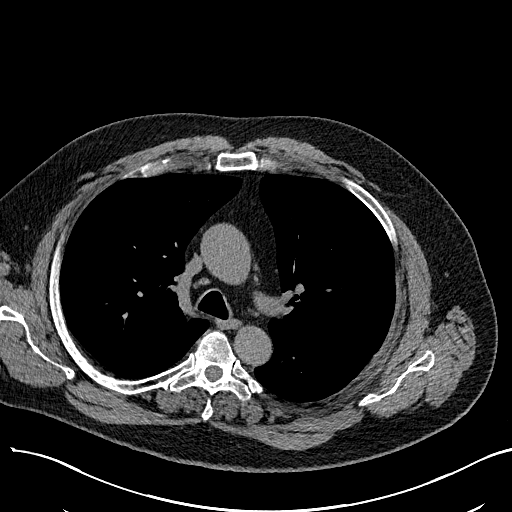
[im 103/147  lung]
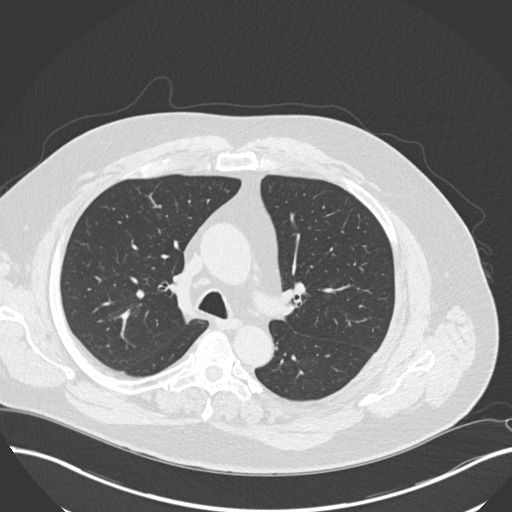
[im 114/147  lung]
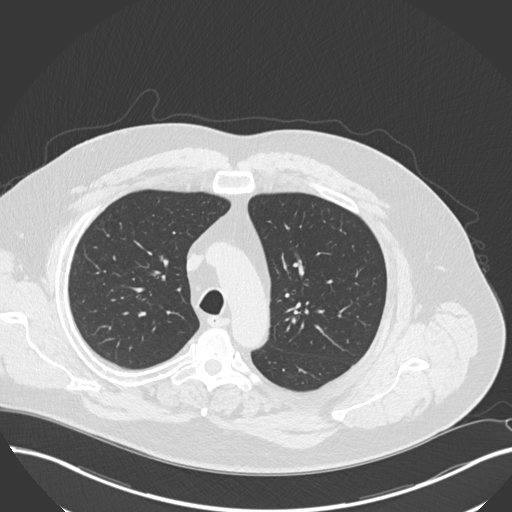
[im 125/147  lung]
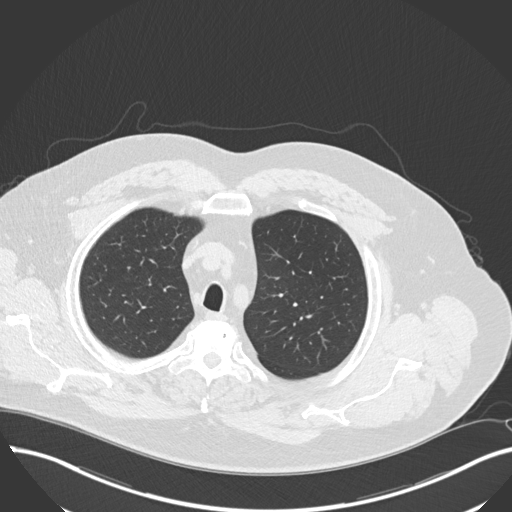
[im 136/147  lung]
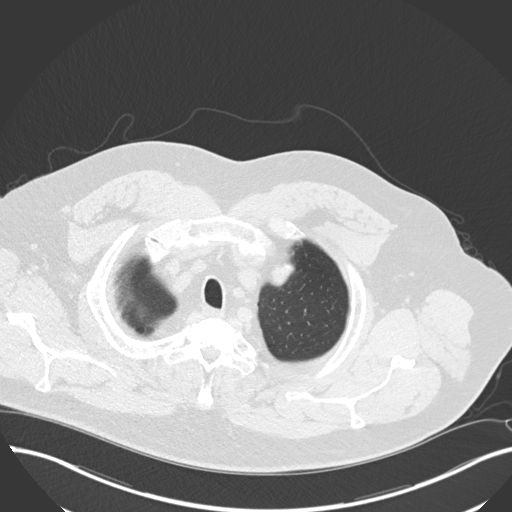

[Series 5: coronal · coronal · 0.64mm/px · 3 of 169 slices shown]
[im 34/169  lung]
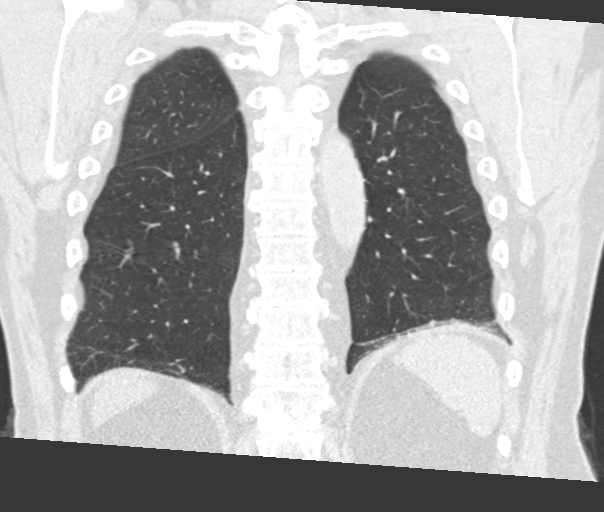
[im 68/169  lung]
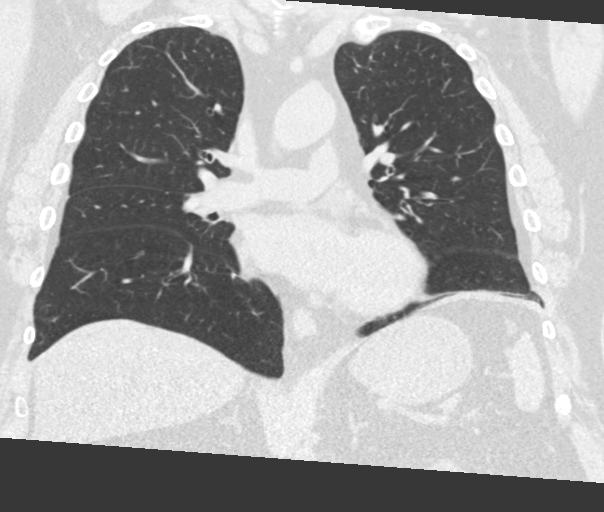
[im 101/169  lung]
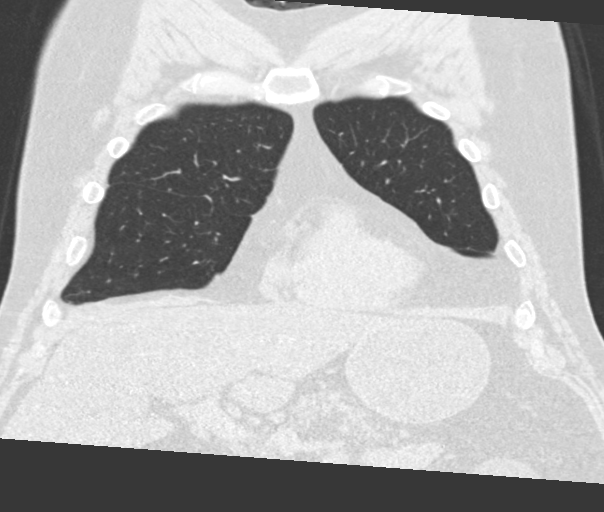

[15 of 36 positions shown; findings below may reference images not displayed]

FINDINGS: Cardiovascular: Normal heart size. Minimal anterior pericardial
fluid or thickening. Mild atherosclerotic calcifications for age.

Mediastinum/Nodes: Negative for mass or adenopathy.

Lungs/Pleura: The right anterior extrapleural opacity on previous
abdominal CT is volume averaging of costochondral cartilage. No
suspicious nodule. Mild hyperinflation with diaphragm flattening.
Mild scarring or atelectasis over the diaphragm.

Upper Abdomen: Recently evaluated by dedicated abdominal CT. Hepatic
steatosis, fatty pancreas atrophy, and renal cysts.

Musculoskeletal: Generalized thoracic spondylosis and disc
narrowing.
IMPRESSION: 1. The abnormality on previous abdominal CT reflects volume
averaging of costochondral cartilage. No suspicious nodule.
2. Hyperinflation.

## 2018-05-14 ENCOUNTER — Ambulatory Visit: Payer: Federal, State, Local not specified - PPO | Admitting: Internal Medicine

## 2018-05-14 ENCOUNTER — Encounter: Payer: Self-pay | Admitting: Internal Medicine

## 2018-05-14 VITALS — BP 122/84 | HR 77 | Resp 16 | Ht 67.0 in | Wt 242.0 lb

## 2018-05-14 DIAGNOSIS — J449 Chronic obstructive pulmonary disease, unspecified: Secondary | ICD-10-CM

## 2018-05-14 DIAGNOSIS — R079 Chest pain, unspecified: Secondary | ICD-10-CM | POA: Diagnosis not present

## 2018-05-14 DIAGNOSIS — Z23 Encounter for immunization: Secondary | ICD-10-CM

## 2018-05-14 MED ORDER — PREDNISONE 20 MG PO TABS: 20.0000 mg | ORAL_TABLET | Freq: Every day | ORAL | 0 refills | Status: DC

## 2018-05-14 NOTE — Progress Notes (Signed)
[facility] Consultation      Date: 05/14/2018,   MRN# [id number] KAILO KOSIK 1946/05/29     Admission                  Current  JAYSHAUN PHILLIPS is a 72 y.o. old male seen in consultation for cough/congestion at the request of Dr. Sanda Klein.  Previous history 72 yo white male with dx of ASTHMA in his 4's-he is [place] Patient has been on inhaled steroids for many years. Patient has chronic fatigue and SOB and DOE for very long time Quit smoking 40 years ago Smoked 1 ppd for 20 years Worked as Gaffer, retired 5 years now Has gained 60 pounds in 32 years    CHIEF COMPLAINT:   Cough, follow up moderate COPD Follow up Moderate COPD  HISTORY OF PRESENT ILLNESS   Patient here for follow-up COPD assessment Patient is on Symbicort and seems to be working No signs of infection at this time No signs of COPD exacerbation at this time  No lower extremity swelling No signs of CHF at this time  Patient does not want sleep study and refuses oxygen therapy if needed  Office Arlyce Harman shows ratio 68% Fev1 1.8L 64% fef25/75 50% Moderate Obstructive and Restrictive lung disease  Patient needs to lose weight  Patient currently has some intermittent wheezing Will prescribe prednisone therapy at this time  Patient also states that he has been having intermittent chest pain on and off for the last several months He did not tell anyone but his wife Described as substernal chest pain Happens at rest Patient will need cardiology referral  Current Medication:  Current Outpatient Medications:  .  acetaminophen (TYLENOL) 325 MG tablet, Take 2 tablets (650 mg total) by mouth every 6 (six) hours as needed for mild pain or fever., Disp: , Rfl:  .  albuterol (PROVENTIL HFA) 108 (90 Base) MCG/ACT inhaler, Inhale 2 puffs into the lungs every 4 (four) hours as needed for wheezing or shortness of breath., Disp: 3 Inhaler, Rfl: 10 .  atorvastatin (LIPITOR) 10 MG  tablet, Take 1 tablet (10 mg total) by mouth at bedtime., Disp: 30 tablet, Rfl: 1 .  Bioflavonoid Products (VITAMIN C) CHEW, Take 240 mgs once daily, Disp: , Rfl:  .  finasteride (PROSCAR) 5 MG tablet, Take 1 tablet (5 mg total) by mouth daily., Disp: 90 tablet, Rfl: 3 .  FLUoxetine (PROZAC) 20 MG capsule, TAKE 1 CAPSULE (20 MG TOTAL) BY MOUTH DAILY., Disp: 90 capsule, Rfl: 0 .  Ketotifen Fumarate (ALLERGY EYE DROPS OP), Apply 1 drop to eye daily as needed (allergies)., Disp: , Rfl:  .  Multiple Vitamins-Minerals (MULTI ADULT GUMMIES) CHEW, Chew 1 tablet by mouth daily., Disp: , Rfl:  .  olmesartan (BENICAR) 20 MG tablet, Take 1 tablet (20 mg total) by mouth daily. Waiting on lab results, Disp: 90 tablet, Rfl: 3 .  SYMBICORT 160-4.5 MCG/ACT inhaler, Inhale 2 puffs into the lungs 2 (two) times daily., Disp: 3 Inhaler, Rfl: 2 .  traZODone (DESYREL) 50 MG tablet, Take 0.5-1 tablets (25-50 mg total) by mouth at bedtime as needed for sleep., Disp: 30 tablet, Rfl: 5 .  triamcinolone ointment (KENALOG) 0.5 %, Apply 1 application topically 2 (two) times daily., Disp: 30 g, Rfl: 2    ALLERGIES   Other     REVIEW OF SYSTEMS   Review of Systems  Constitutional: Negative for chills, diaphoresis, fever, malaise/fatigue and weight loss.  HENT: Negative  for congestion and hearing loss.   Eyes: Negative for blurred vision and double vision.  Respiratory: Negative for cough, hemoptysis, sputum production, shortness of breath and wheezing.   Cardiovascular: Negative for chest pain, palpitations, orthopnea and leg swelling.  Gastrointestinal: Negative for abdominal pain, heartburn, nausea and vomiting.  Skin: Negative for rash.  Neurological: Negative for weakness and headaches.  All other systems reviewed and are negative.   BP 122/84 (BP Location: Left Arm, Patient Position: Sitting, Cuff Size: Normal)   Pulse 77   Resp 16   Ht 5\' 7"  (1.702 m)   Wt 242 lb (109.8 kg)   SpO2 96%   BMI 37.90  kg/m     PHYSICAL EXAM  Physical Exam  Constitutional: He is oriented to person, place, and time. He appears well-developed and well-nourished. No distress.  HENT:  Mouth/Throat: No oropharyngeal exudate.  Neck: Neck supple.  Cardiovascular: Normal rate, regular rhythm and normal heart sounds.  No murmur heard. Pulmonary/Chest: Effort normal and breath sounds normal. No stridor. No respiratory distress. He has no wheezes.  Musculoskeletal: Normal range of motion. He exhibits no edema.  Neurological: He is alert and oriented to person, place, and time.  Skin: Skin is warm. He is not diaphoretic.  Psychiatric: He has a normal mood and affect.       ASSESSMENT/PLAN   73 year old pleasant overweight white male with moderate COPD Gold stage B with signs symptoms of sleep apnea but patient is refusing to obtain sleep study and if needed he would not want to have oxygen therapy Patient now has chest pain substernal and will need cardiology referral Patient also has mild COPD exacerbation at this time  #1 shortness of breath and dyspnea on exertion likely related to multifactorial etiologies including COPD obesity and deconditioned state  #2 moderate COPD Gold stage B Continue Symbicort as prescribed Albuterol as needed  #3 signs and symptoms of OSA Patient refuses any further work-up at this time  #4 obesity -recommend significant weight loss -recommend changing diet  #5 deconditioned state -Recommend increased daily activity and exercise  #6 mild COPD exacerbation Prednisone 20 mg daily for 7 days  #7 chest pain substernal happens at rest Patient needs cardiology referral  Flu shot given today   Patient satisfied with Plan of action and management. All questions answered  Corrin Parker, M.D.  [facility]  Medical Director [place] Director [facility] Cardio-Pulmonary Department

## 2018-05-14 NOTE — Patient Instructions (Addendum)
Cardiology referral for Chest pain  Continue inhalers as prescribed   Prednisone 20 mg daily for 7 days

## 2018-05-16 ENCOUNTER — Other Ambulatory Visit: Payer: Self-pay | Admitting: *Deleted

## 2018-05-16 DIAGNOSIS — R079 Chest pain, unspecified: Secondary | ICD-10-CM

## 2018-05-19 DIAGNOSIS — I10 Essential (primary) hypertension: Secondary | ICD-10-CM | POA: Insufficient documentation

## 2018-05-19 DIAGNOSIS — I2510 Atherosclerotic heart disease of native coronary artery without angina pectoris: Secondary | ICD-10-CM | POA: Insufficient documentation

## 2018-05-19 DIAGNOSIS — I208 Other forms of angina pectoris: Secondary | ICD-10-CM | POA: Insufficient documentation

## 2018-05-24 ENCOUNTER — Other Ambulatory Visit: Payer: Self-pay | Admitting: Family Medicine

## 2018-06-09 ENCOUNTER — Telehealth: Payer: Self-pay | Admitting: Internal Medicine

## 2018-06-09 NOTE — Telephone Encounter (Signed)
Spoke to patient, let him know that he could continue to use his inhalers for about a year after the expiration date, per Dr. Juanell Fairly. He is aware to call us if he needs refills at any point. Nothing further needed at this time.

## 2018-06-09 NOTE — Telephone Encounter (Signed)
Patient calling asking for a call back States he has a few inhalers with him but would like to know if inhalers have an expiration date   Please call back

## 2018-06-15 ENCOUNTER — Other Ambulatory Visit: Payer: Self-pay | Admitting: Nurse Practitioner

## 2018-06-24 ENCOUNTER — Ambulatory Visit
Admission: RE | Admit: 2018-06-24 | Discharge: 2018-06-24 | Disposition: A | Discharge status: Home / Self Care | Payer: Federal, State, Local not specified - PPO | Source: Ambulatory Visit | Attending: Family Medicine | Admitting: Family Medicine

## 2018-06-24 ENCOUNTER — Other Ambulatory Visit
Admission: RE | Admit: 2018-06-24 | Discharge: 2018-06-24 | Disposition: A | Discharge status: Home / Self Care | Payer: Federal, State, Local not specified - PPO | Source: Ambulatory Visit | Attending: Family Medicine | Admitting: Family Medicine

## 2018-06-24 ENCOUNTER — Encounter: Payer: Self-pay | Admitting: Family Medicine

## 2018-06-24 ENCOUNTER — Ambulatory Visit: Payer: Federal, State, Local not specified - PPO | Admitting: Family Medicine

## 2018-06-24 VITALS — BP 128/82 | HR 91 | Temp 98.3°F | Ht 66.0 in | Wt 241.1 lb

## 2018-06-24 DIAGNOSIS — R079 Chest pain, unspecified: Secondary | ICD-10-CM

## 2018-06-24 DIAGNOSIS — I10 Essential (primary) hypertension: Secondary | ICD-10-CM | POA: Diagnosis not present

## 2018-06-24 DIAGNOSIS — I252 Old myocardial infarction: Secondary | ICD-10-CM | POA: Diagnosis not present

## 2018-06-24 DIAGNOSIS — J449 Chronic obstructive pulmonary disease, unspecified: Secondary | ICD-10-CM | POA: Diagnosis not present

## 2018-06-24 DIAGNOSIS — E782 Mixed hyperlipidemia: Secondary | ICD-10-CM

## 2018-06-24 DIAGNOSIS — I25119 Atherosclerotic heart disease of native coronary artery with unspecified angina pectoris: Secondary | ICD-10-CM

## 2018-06-24 LAB — BASIC METABOLIC PANEL
ANION GAP: 8 (ref 5–15)
BUN: 14 mg/dL (ref 8–23)
CHLORIDE: 103 mmol/L (ref 98–111)
CO2: 28 mmol/L (ref 22–32)
Calcium: 10.2 mg/dL (ref 8.9–10.3)
Creatinine, Ser: 1.13 mg/dL (ref 0.61–1.24)
Glucose, Bld: 88 mg/dL (ref 70–99)
POTASSIUM: 4.3 mmol/L (ref 3.5–5.1)
SODIUM: 139 mmol/L (ref 135–145)

## 2018-06-24 LAB — CBC WITH DIFFERENTIAL/PLATELET
Abs Immature Granulocytes: 0.03 10*3/uL (ref 0.00–0.07)
BASOS ABS: 0 10*3/uL (ref 0.0–0.1)
BASOS PCT: 1 %
Eosinophils Absolute: 0.2 10*3/uL (ref 0.0–0.5)
Eosinophils Relative: 3 %
HEMATOCRIT: 48.1 % (ref 39.0–52.0)
Hemoglobin: 16.1 g/dL (ref 13.0–17.0)
IMMATURE GRANULOCYTES: 0 %
LYMPHS ABS: 1.6 10*3/uL (ref 0.7–4.0)
Lymphocytes Relative: 20 %
MCH: 30 pg (ref 26.0–34.0)
MCHC: 33.5 g/dL (ref 30.0–36.0)
MCV: 89.6 fL (ref 80.0–100.0)
MONOS PCT: 9 %
Monocytes Absolute: 0.7 10*3/uL (ref 0.1–1.0)
NEUTROS PCT: 67 %
Neutro Abs: 5.6 10*3/uL (ref 1.7–7.7)
PLATELETS: 223 10*3/uL (ref 150–400)
RBC: 5.37 MIL/uL (ref 4.22–5.81)
RDW: 13.1 % (ref 11.5–15.5)
WBC: 8.2 10*3/uL (ref 4.0–10.5)
nRBC: 0 % (ref 0.0–0.2)

## 2018-06-24 LAB — LIPID PANEL
CHOLESTEROL: 157 mg/dL (ref 0–200)
HDL: 53 mg/dL (ref >40)
LDL Cholesterol: 74 mg/dL (ref 0–99)
TRIGLYCERIDES: 152 mg/dL — AB (ref <150)
Total CHOL/HDL Ratio: 3 RATIO
VLDL: 30 mg/dL (ref 0–40)

## 2018-06-24 LAB — TROPONIN I: Troponin I: <0.03 ng/mL (ref <0.03)

## 2018-06-24 MED ORDER — ASPIRIN EC 81 MG PO TBEC: 81.0000 mg | DELAYED_RELEASE_TABLET | Freq: Every day | ORAL | Status: DC

## 2018-06-24 NOTE — Progress Notes (Signed)
BP 128/82   Pulse 91   Temp 98.3 F (36.8 C)   Ht 5\' 6"  (1.676 m)   Wt 241 lb 1.6 oz (109.4 kg)   SpO2 95%   BMI 38.91 kg/m    Subjective:    Patient ID: Willie Crane, male    DOB: 1945/08/13, 72 y.o.   MRN: [id number]  HPI: Willie Crane is a 72 y.o. male  Chief Complaint  Patient presents with  . Hernia    HPI Patient is here with   Chest pain under the breastbone; going on for a year; could get pretty intense; 8 out of 10 at times; sitting in the chair and breathing and then would dissipate; it would do that for a day or so and be fine for months; then it would come back; it does not correlate with acid reflux, which he has had in the past He went to see his asthma doctor and then he saw cardiologist; did EKG and he thought it looked normal, some little spike but normal in most people; then he said to get some studies but patient has not had any studies done He has CAD per cardiologist note; he told him to go work out Pain in the in the middle of the chest, lower 1/3 of breastbone He is sore and achy after that; he will have pain for a few days and then it's gone Second to last episode was 2.5 months ago; had it yesterday for 3-4 hours LDLwas 100 last check  The ASCVD Risk score Mikey Bussing DC Jr., et al., 2013) failed to calculate for the following reasons:   The patient has a prior MI or stroke diagnosis   Depression screen [facility] 2/9 06/24/2018 01/13/2018 11/16/2016 09/27/2016 12/26/2015  Decreased Interest 0 0 0 0 0  Down, Depressed, Hopeless 0 1 0 0 0  PHQ - 2 Score 0 1 0 0 0  Altered sleeping 0 3 - - -  Tired, decreased energy 0 1 - - -  Change in appetite 0 1 - - -  Feeling bad or failure about yourself  0 0 - - -  Trouble concentrating 0 1 - - -  Moving slowly or fidgety/restless 0 0 - - -  Suicidal thoughts 0 0 - - -  PHQ-9 Score 0 7 - - -  Difficult doing work/chores Not difficult at all Not difficult at all - - -   Fall Risk  06/24/2018 01/13/2018 11/16/2016  09/27/2016 12/26/2015  Falls in the past year? 1 No No No Yes  Number falls in past yr: 0 - - - 2 or more  Injury with Fall? 0 - - - No    Relevant past medical, surgical, family and social history reviewed Past Medical History:  Diagnosis Date  . Anxiety   . Asthma   . Benign paroxysmal positional vertigo   . BPH (benign prostatic hypertrophy)   . Complication of anesthesia   . COPD (chronic obstructive pulmonary disease) ([place])   . Coronary artery disease    history of heart attack  . Degeneration of lumbar or lumbosacral intervertebral disc   . History of kidney stones   . Hyperlipidemia   . Hypertension   . MI (myocardial infarction) ([place])    1980s  . Obesity, unspecified   . PONV (postoperative nausea and vomiting)   . PTSD (post-traumatic stress disorder)   . Renal disorder    kidney stones  . Thoracic or lumbosacral neuritis or radiculitis,  unspecified   . Unspecified congenital anomaly of the integument   . Unspecified gastritis and gastroduodenitis without mention of hemorrhage    Past Surgical History:  Procedure Laterality Date  . BACK SURGERY    . CARDIAC CATHETERIZATION  03/2009   [facility]: No significant coronary artery disease  . CARDIAC CATHETERIZATION  01/2014   [facility]  . CARPAL TUNNEL RELEASE Right   . CHOLECYSTECTOMY    . COLONOSCOPY    . COLONOSCOPY WITH PROPOFOL N/A 10/31/2015   Procedure: COLONOSCOPY WITH PROPOFOL;  Surgeon: Lucilla Lame, MD;  Location: [facility];  Service: Endoscopy;  Laterality: N/A;  . CYST EXCISION    . CYSTOSCOPY W/ URETERAL STENT PLACEMENT Left 10/26/2016   Procedure: CYSTOSCOPY WITH STENT REPLACEMENT;  Surgeon: Nickie Retort, MD;  Location: [facility];  Service: Urology;  Laterality: Left;  . CYSTOSCOPY WITH STENT PLACEMENT Left 10/05/2016   Procedure: CYSTOSCOPY WITH STENT PLACEMENT;  Surgeon: Nickie Retort, MD;  Location: [facility];  Service: Urology;  Laterality: Left;  . HERNIA REPAIR    . INGUINAL HERNIA REPAIR  Right   . INGUINAL HERNIA REPAIR Left 01/26/2016   Procedure: HERNIA REPAIR INGUINAL ADULT;  Surgeon: Christene Lye, MD;  Location: [facility];  Service: General;  Laterality: Left;  . KNEE ARTHROSCOPY    . LUMBAR LAMINECTOMY    . POLYPECTOMY  10/31/2015   Procedure: POLYPECTOMY;  Surgeon: Lucilla Lame, MD;  Location: [facility];  Service: Endoscopy;;  . URETEROSCOPY Left 10/05/2016   Procedure: URETEROSCOPY;  Surgeon: Nickie Retort, MD;  Location: [facility];  Service: Urology;  Laterality: Left;  . URETEROSCOPY WITH HOLMIUM LASER LITHOTRIPSY Left 10/26/2016   Procedure: URETEROSCOPY WITH HOLMIUM LASER LITHOTRIPSY;  Surgeon: Nickie Retort, MD;  Location: [facility];  Service: Urology;  Laterality: Left;  Marland Kitchen VASECTOMY     Family History  Problem Relation Age of Onset  . Kidney failure Mother   . Cancer Mother   . Cancer Father        lung  . Prostate cancer Neg Hx   MD note: unknown if any cardiac history; mother died of cancer and father too  Social History   Tobacco Use  . Smoking status: Former Smoker    Packs/day: 1.00    Years: 20.00    Pack years: 20.00    Types: Cigarettes    Last attempt to quit: 01/19/1979    Years since quitting: 39.4  . Smokeless tobacco: Former Systems developer    Types: Chew  Substance Use Topics  . Alcohol use: Yes    Alcohol/week: 0.0 standard drinks    Comment: Occasional Wine  . Drug use: No     [facility] Visit from 06/24/2018 in [facility]  AUDIT-C Score  1      Interim medical history since last visit reviewed. Allergies and medications reviewed  Review of Systems Per HPI unless specifically indicated above     Objective:    BP 128/82   Pulse 91   Temp 98.3 F (36.8 C)   Ht 5\' 6"  (1.676 m)   Wt 241 lb 1.6 oz (109.4 kg)   SpO2 95%   BMI 38.91 kg/m   Wt Readings from Last 3 Encounters:  06/30/18 239 lb (108.4 kg)  06/24/18 241 lb 1.6 oz (109.4 kg)  05/14/18 242 lb (109.8 kg)    Physical Exam    Constitutional: He appears well-developed and well-nourished. No distress.  obese  HENT:  Head: Normocephalic and  atraumatic.  Eyes: EOM are normal. No scleral icterus.  Neck: No thyromegaly present.  Cardiovascular: Normal rate and regular rhythm.  Pulmonary/Chest: Effort normal and breath sounds normal.  Abdominal: Soft. Bowel sounds are normal. He exhibits no distension.  Musculoskeletal: He exhibits no edema.  Neurological: Coordination normal.  Skin: Skin is warm and dry. No pallor.  Psychiatric: He has a normal mood and affect. His behavior is normal. Judgment and thought content normal.      Assessment & Plan:   Problem List Items Addressed This Visit      Cardiovascular and Mediastinum   Hypertension    controlled      Relevant Medications   aspirin EC 81 MG tablet   Coronary artery disease    Referring to cardiologist; reviewed cards note; explained to patient that I very much want him to complete this work-up; call 911 if recurs      Relevant Medications   aspirin EC 81 MG tablet     Other   Chest pain - Primary   Relevant Orders   DG Chest 2 View (Completed)   Ambulatory referral to Cardiology   Ambulatory referral to Gastroenterology   Basic metabolic panel (Completed)   CBC with Differential/Platelet (Completed)   Lipid panel (Completed)   Troponin I (Completed)   Morbid obesity (HCC)    BMI >35 with CAD, HTN, high chol      Hyperlipidemia    Goal LDL less than 70; encouraged weigh tloss and healthier eating      Relevant Medications   aspirin EC 81 MG tablet   Hx of myocardial infarction    Refer to cardiologist; labs today; precautions given, call 911 if recurs          Follow up plan: Return in about 2 weeks (around 07/08/2018) for follow-up visit with Dr. Sanda Klein.  An after-visit summary was printed and given to the patient at [facility].  Please see the patient instructions which may contain other information and recommendations beyond  what is mentioned above in the assessment and plan.  Meds ordered this encounter  Medications  . aspirin EC 81 MG tablet    Sig: Take 1 tablet (81 mg total) by mouth daily.    Orders Placed This Encounter  Procedures  . DG Chest 2 View  . Basic metabolic panel  . CBC with Differential/Platelet  . Lipid panel  . Troponin I  . Ambulatory referral to Cardiology  . Ambulatory referral to Gastroenterology

## 2018-06-24 NOTE — Patient Instructions (Addendum)
I will contact you at [phone] with the lab results Check your messages Please go now to the hospital for labs and a chest xray We'll have you see the gastroenterologist and the cardiologist  Try to limit saturated fats in your diet (bologna, hot dogs, barbeque, cheeseburgers, hamburgers, steak, bacon, sausage, cheese, etc.) and get more fresh fruits, vegetables, and whole grains  Check out the information at familydoctor.org entitled "Nutrition for Weight Loss: What You Need to Know about Fad Diets" Try to lose between 1-2 pounds per week by taking in fewer calories and burning off more calories You can succeed by limiting portions, limiting foods dense in calories and fat, becoming more active, and drinking 8 glasses of water a day (64 ounces) Don't skip meals, especially breakfast, as skipping meals may alter your metabolism Do not use over-the-counter weight loss pills or gimmicks that claim rapid weight loss A healthy BMI (or body mass index) is between 18.5 and 24.9 You can calculate your ideal BMI at the [place] website [employer]fr

## 2018-06-25 ENCOUNTER — Telehealth: Payer: Self-pay

## 2018-06-25 MED ORDER — FAMOTIDINE 20 MG PO TABS: 20.0000 mg | ORAL_TABLET | Freq: Two times a day (BID) | ORAL | 2 refills | Status: DC

## 2018-06-25 NOTE — Telephone Encounter (Signed)
Pt.notified

## 2018-06-25 NOTE — Telephone Encounter (Signed)
Rx for famotidine sent.

## 2018-06-25 NOTE — Telephone Encounter (Signed)
Wife wants to see about getting rx for reflux med?

## 2018-06-29 DIAGNOSIS — F17200 Nicotine dependence, unspecified, uncomplicated: Secondary | ICD-10-CM | POA: Insufficient documentation

## 2018-06-29 NOTE — Progress Notes (Signed)
Cardiology Office Note  Date:  06/30/2018   ID:  Willie Crane, DOB 16-Oct-1945, MRN [id number]  PCP:  Arnetha Courser, MD   Chief Complaint  Patient presents with  . New Patient (Initial Visit)    cp Per Dr. Lina Sar pt.? indigestion/Hiatial hernia.Medications reviewed verbally.    HPI:  Willie Crane is a 72 year old gentleman with past medical history of Smoked 1 ppd for 20 years, Quit smoking 40 years ago Cardiac cath 01/2014: minor disease ASTHMA in his 15's Patient has been on inhaled steroids for many years.  chronic fatigue Who presents by referral from Dr. Sanda Klein for consultation of his chest pain  Worked as Gaffer, retired 5 years now Has gained 60 pounds in 4 years  Reports having severe coughing spells worse at nighttime when he is sleeping in his recliner Wife reports that he has large meals late at night Periodic discomfort in his chest, He is concerned about hiatal hernia On further discussion of his coughing, reports it is severe, will lose his breath, often will develop discomfort following the cough spells  Chest discomfort often tender with palpation, typically radiating over on the right side substernal area, has mild discomfort now, sometimes gets relief by stretching out his chest arms above his head Denies any back pain radiating around to the front  Previously seen at [facility] It would appear echocardiogram stress test was ordered Stress test was not completed  CT chest 09/2016 Images pulled up in the office and reviewed with him in detail No coronary calcification noted, no aortic atherosclerosis No hiatal hernia  CT ABD 11/2016 No PAD, no aortic atherosclerosis noted  EKG personally reviewed by myself on todays visit Shows normal sinus rhythm with rate 73 bpm no significant ST or T wave changes   PMH:   has a past medical history of Anxiety, Asthma, Benign paroxysmal positional vertigo, BPH (benign prostatic  hypertrophy), Complication of anesthesia, COPD (chronic obstructive pulmonary disease) ([place]), Coronary artery disease, Degeneration of lumbar or lumbosacral intervertebral disc, History of kidney stones, Hyperlipidemia, Hypertension, MI (myocardial infarction) ([place]), Obesity, unspecified, PONV (postoperative nausea and vomiting), PTSD (post-traumatic stress disorder), Renal disorder, Thoracic or lumbosacral neuritis or radiculitis, unspecified, Unspecified congenital anomaly of the integument, and Unspecified gastritis and gastroduodenitis without mention of hemorrhage.  PSH:    Past Surgical History:  Procedure Laterality Date  . BACK SURGERY    . CARDIAC CATHETERIZATION  03/2009   [facility]: No significant coronary artery disease  . CARDIAC CAT[facility]IZATION  01/2014   [facility]  . CARPAL TUNNEL RELEASE Right   . CHOLECYSTECTOMY    . COLONOSCOPY    . COLONOSCOPY WITH PROPOFOL N/A 10/31/2015   Procedure: COLONOSCOPY WITH PROPOFOL;  Surgeon: Lucilla Lame, MD;  Location: [place];  Service: Endoscopy;  Laterality: N/A;  . CYST EXCISION    . CYSTOSCOPY W/ URETERAL STENT PLACEMENT Left 10/26/2016   Procedure: CYSTOSCOPY WITH STENT REPLACEMENT;  Surgeon: Nickie Retort, MD;  Location: [facility];  Service: Urology;  Laterality: Left;  . CYSTOSCOPY WITH STENT PLACEMENT Left 10/05/2016   Procedure: CYSTOSCOPY WITH STENT PLACEMENT;  Surgeon: Nickie Retort, MD;  Location: [facility];  Service: Urology;  Laterality: Left;  . HERNIA REPAIR    . INGUINAL HERNIA REPAIR Right   . INGUINAL HERNIA REPAIR Left 01/26/2016   Procedure: HERNIA REPAIR INGUINAL ADULT;  Surgeon: Christene Lye, MD;  Location: [facility];  Service: General;  Laterality: Left;  . KNEE ARTHROSCOPY    .  LUMBAR LAMINECTOMY    . POLYPECTOMY  10/31/2015   Procedure: POLYPECTOMY;  Surgeon: Lucilla Lame, MD;  Location: [place];  Service: Endoscopy;;  . URETEROSCOPY Left 10/05/2016   Procedure: URETEROSCOPY;  Surgeon: Nickie Retort, MD;  Location: [facility];  Service: Urology;  Laterality: Left;  . URETEROSCOPY WITH HOLMIUM LASER LITHOTRIPSY Left 10/26/2016   Procedure: URETEROSCOPY WITH HOLMIUM LASER LITHOTRIPSY;  Surgeon: Nickie Retort, MD;  Location: [facility];  Service: Urology;  Laterality: Left;  Marland Kitchen VASECTOMY      Current Outpatient Medications  Medication Sig Dispense Refill  . acetaminophen (TYLENOL) 325 MG tablet Take 2 tablets (650 mg total) by mouth every 6 (six) hours as needed for mild pain or fever.    Marland Kitchen albuterol (PROVENTIL HFA) 108 (90 Base) MCG/ACT inhaler Inhale 2 puffs into the lungs every 4 (four) hours as needed for wheezing or shortness of breath. 3 Inhaler 10  . aspirin EC 81 MG tablet Take 1 tablet (81 mg total) by mouth daily.    Marland Kitchen atorvastatin (LIPITOR) 10 MG tablet Take 1 tablet (10 mg total) by mouth at bedtime. 30 tablet 1  . Bioflavonoid Products (VITAMIN C) CHEW Take 240 mgs once daily    . famotidine (PEPCID) 20 MG tablet Take 1 tablet (20 mg total) by mouth 2 (two) times daily. 60 tablet 2  . finasteride (PROSCAR) 5 MG tablet Take 1 tablet (5 mg total) by mouth daily. 90 tablet 3  . FLUoxetine (PROZAC) 20 MG capsule TAKE 1 CAPSULE BY MOUTH EVERY DAY 90 capsule 1  . Ketotifen Fumarate (ALLERGY EYE DROPS OP) Apply 1 drop to eye daily as needed (allergies).    . Multiple Vitamins-Minerals (MULTI ADULT GUMMIES) CHEW Chew 1 tablet by mouth daily.    Marland Kitchen olmesartan (BENICAR) 20 MG tablet Take 1 tablet (20 mg total) by mouth daily. Waiting on lab results 90 tablet 3  . SYMBICORT 160-4.5 MCG/ACT inhaler Inhale 2 puffs into the lungs 2 (two) times daily. 3 Inhaler 2  . traZODone (DESYREL) 50 MG tablet Take 0.5-1 tablets (25-50 mg total) by mouth at bedtime as needed for sleep. 30 tablet 5  . triamcinolone ointment (KENALOG) 0.5 % Apply 1 application topically 2 (two) times daily. 30 g 2  . omeprazole (PRILOSEC) 20 MG capsule Take 1 capsule (20 mg total) by mouth 2 (two) times daily  before a meal. 60 capsule 1   No current facility-administered medications for this visit.      Allergies:   Other   Social History:  The patient  reports that he quit smoking about 39 years ago. His smoking use included cigarettes. He has a 20.00 pack-year smoking history. He has quit using smokeless tobacco.  His smokeless tobacco use included chew. He reports that he drinks alcohol. He reports that he does not use drugs.   Family History:   family history includes Cancer in his father and mother; Kidney failure in his mother.    Review of Systems: Review of Systems  Constitutional: Negative.   Respiratory: Positive for cough and wheezing.   Cardiovascular: Positive for chest pain.  Gastrointestinal: Negative.   Musculoskeletal: Negative.   Neurological: Negative.   Psychiatric/Behavioral: Negative.   All other systems reviewed and are negative.    PHYSICAL EXAM: VS:  BP (!) 142/82 (BP Location: Right Arm, Patient Position: Sitting, Cuff Size: Normal)   Ht 5\' 6"  (1.676 m)   Wt 239 lb (108.4 kg)   BMI 38.58 kg/m  ,  BMI Body mass index is 38.58 kg/m. GEN: Well nourished, well developed, in no acute distress , obese HEENT: normal  Neck: no JVD, carotid bruits, or masses Cardiac: RRR; no murmurs, rubs, or gallops,no edema  Respiratory:  clear to auscultation bilaterally, normal work of breathing GI: soft, nontender, nondistended, + BS MS: no deformity or atrophy  Skin: warm and dry, no rash Neuro:  Strength and sensation are intact Psych: euthymic mood, full affect  Recent Labs: 03/24/2018: ALT 23; Magnesium 1.7 06/24/2018: BUN 14; Creatinine, Ser 1.13; Hemoglobin 16.1; Platelets 223; Potassium 4.3; Sodium 139    Lipid Panel Lab Results  Component Value Date   CHOL 157 06/24/2018   HDL 53 06/24/2018   LDLCALC 74 06/24/2018   TRIG 152 (H) 06/24/2018      Wt Readings from Last 3 Encounters:  06/30/18 239 lb (108.4 kg)  06/24/18 241 lb 1.6 oz (109.4 kg)   05/14/18 242 lb (109.8 kg)     ASSESSMENT AND PLAN:  Stage 2 moderate COPD by GOLD classification ([place]) - Plan: EKG 12-Lead 20 years of smoking, stopped 40 years ago Recommended weight loss, walking program  Essential hypertension - Plan: EKG 12-Lead Blood pressure is mildly elevated, recommended he monitor this at home .  no changes made to the medications. Weight loss  Mixed hyperlipidemia Cholesterol at goal CT scan with no calcification, no aortic atherosclerosis  Moderate persistent asthma without complication Will defer to primary care Suggested he try proton pump inhibitor for suspected reflux Large meals late at night and sleeps in a recliner Reflux likely exacerbated by obesity  Smoker Reports that he stopped 40 years ago  Chest pain, unspecified type No coronary calcification on CT scan, prior cardiac catheterization with no significant disease.  Chest pain is atypical in nature, exacerbated by coughing, likely musculoskeletal Unable to exclude GERD symptoms Recommended he try omeprazole 20 mg daily If no improvement in symptoms after 2 weeks suggested he try 20 twice daily He is scheduled to see gastroenterology  Disposition:   F/U as needed   Total encounter time more than 25 minutes  Greater than 50% was spent in counseling and coordination of care with the patient    Orders Placed This Encounter  Procedures  . EKG 12-Lead     Signed, Esmond Plants, M.D., Ph.D. 06/30/2018  [place], [place]

## 2018-06-30 ENCOUNTER — Encounter: Payer: Self-pay | Admitting: Cardiovascular Disease

## 2018-06-30 ENCOUNTER — Ambulatory Visit: Payer: Federal, State, Local not specified - PPO | Admitting: Cardiovascular Disease

## 2018-06-30 VITALS — BP 142/82 | Ht 66.0 in | Wt 239.0 lb

## 2018-06-30 DIAGNOSIS — I10 Essential (primary) hypertension: Secondary | ICD-10-CM | POA: Diagnosis not present

## 2018-06-30 DIAGNOSIS — E782 Mixed hyperlipidemia: Secondary | ICD-10-CM

## 2018-06-30 DIAGNOSIS — J4540 Moderate persistent asthma, uncomplicated: Secondary | ICD-10-CM | POA: Diagnosis not present

## 2018-06-30 DIAGNOSIS — F17200 Nicotine dependence, unspecified, uncomplicated: Secondary | ICD-10-CM

## 2018-06-30 DIAGNOSIS — I2510 Atherosclerotic heart disease of native coronary artery without angina pectoris: Secondary | ICD-10-CM | POA: Insufficient documentation

## 2018-06-30 DIAGNOSIS — J449 Chronic obstructive pulmonary disease, unspecified: Secondary | ICD-10-CM | POA: Diagnosis not present

## 2018-06-30 DIAGNOSIS — I252 Old myocardial infarction: Secondary | ICD-10-CM | POA: Insufficient documentation

## 2018-06-30 DIAGNOSIS — R079 Chest pain, unspecified: Secondary | ICD-10-CM

## 2018-06-30 MED ORDER — OMEPRAZOLE 20 MG PO CPDR: 20.0000 mg | DELAYED_RELEASE_CAPSULE | Freq: Two times a day (BID) | ORAL | 1 refills | Status: DC

## 2018-06-30 NOTE — Assessment & Plan Note (Signed)
Encouraged weight loss 

## 2018-06-30 NOTE — Assessment & Plan Note (Signed)
controlled 

## 2018-06-30 NOTE — Patient Instructions (Addendum)
Medication Instructions:   Please try omeprazole one a day before dinner Try for 1 to 2 weeks If no better, Try twice a day  If you need a refill on your cardiac medications before your next appointment, please call your pharmacy.    Lab work: No new labs needed   If you have labs (blood work) drawn today and your tests are completely normal, you will receive your results only by: Marland Kitchen MyChart Message (if you have MyChart) OR . A paper copy in the mail If you have any lab test that is abnormal or we need to change your treatment, we will call you to review the results.   Testing/Procedures: No new testing needed   Follow-Up: At [facility], you and your health needs are our priority.  As part of our continuing mission to provide you with exceptional heart care, we have created designated Provider Care Teams.  These Care Teams include your primary Cardiologist (physician) and Advanced Practice Providers (APPs -  Physician Assistants and Nurse Practitioners) who all work together to provide you with the care you need, when you need it.  . You will need a follow up appointment as needed  . Providers on your designated Care Team:   . Murray Hodgkins, NP . Christell Faith, PA-C . Marrianne Mood, PA-C  Any Other Special Instructions Will Be Listed Below (If Applicable).  For educational health videos Log in to : [email or web address] Or : [email or web address], password : [email or web address]

## 2018-06-30 NOTE — Assessment & Plan Note (Signed)
Refer to cardiologist; labs today; precautions given, call 911 if recurs

## 2018-06-30 NOTE — Assessment & Plan Note (Signed)
Goal LDL less than 70; encouraged weigh tloss and healthier eating

## 2018-06-30 NOTE — Assessment & Plan Note (Signed)
BMI >35 with CAD, HTN, high chol

## 2018-06-30 NOTE — Assessment & Plan Note (Signed)
Referring to cardiologist; reviewed cards note; explained to patient that I very much want him to complete this work-up; call 911 if recurs

## 2018-07-14 ENCOUNTER — Other Ambulatory Visit: Payer: Self-pay | Admitting: Family Medicine

## 2018-07-14 ENCOUNTER — Other Ambulatory Visit: Payer: Self-pay

## 2018-07-14 DIAGNOSIS — N138 Other obstructive and reflux uropathy: Secondary | ICD-10-CM

## 2018-07-14 DIAGNOSIS — N401 Enlarged prostate with lower urinary tract symptoms: Principal | ICD-10-CM

## 2018-07-15 ENCOUNTER — Encounter: Payer: Self-pay | Admitting: Family Medicine

## 2018-07-15 ENCOUNTER — Ambulatory Visit: Payer: Federal, State, Local not specified - PPO | Admitting: Family Medicine

## 2018-07-15 ENCOUNTER — Other Ambulatory Visit: Payer: Federal, State, Local not specified - PPO

## 2018-07-15 VITALS — BP 132/84 | HR 89 | Temp 97.7°F | Ht 66.0 in | Wt 243.1 lb

## 2018-07-15 DIAGNOSIS — J449 Chronic obstructive pulmonary disease, unspecified: Secondary | ICD-10-CM | POA: Diagnosis not present

## 2018-07-15 DIAGNOSIS — N401 Enlarged prostate with lower urinary tract symptoms: Principal | ICD-10-CM

## 2018-07-15 DIAGNOSIS — J4540 Moderate persistent asthma, uncomplicated: Secondary | ICD-10-CM | POA: Diagnosis not present

## 2018-07-15 DIAGNOSIS — E782 Mixed hyperlipidemia: Secondary | ICD-10-CM

## 2018-07-15 DIAGNOSIS — N138 Other obstructive and reflux uropathy: Secondary | ICD-10-CM

## 2018-07-15 DIAGNOSIS — R0789 Other chest pain: Secondary | ICD-10-CM

## 2018-07-15 NOTE — Progress Notes (Signed)
BP 132/84   Pulse 89   Temp 97.7 F (36.5 C) (Oral)   Ht 5\' 6"  (1.676 m)   Wt 243 lb 1.6 oz (110.3 kg)   SpO2 95%   BMI 39.24 kg/m    Subjective:    Patient ID: Willie Crane, male    DOB: 1945-09-26, 72 y.o.   MRN: [id number]  HPI: Willie Crane is a 72 y.o. male  Chief Complaint  Patient presents with  . Follow-up    HPI Patient is here for f/u He has seen cardiologist, visit was Nov 25th He pulled up the calcium scoring, no plaque per wife; maybe two small white dots He thinks this is COPD; stage 2 COPD by GOLD Wife is concerned about hiatal hernia; symptoms are similar to others Dr. Rockey Situ prescribed  Per his note: "No coronary calcification on CT scan, prior cardiac catheterization with no significant disease." His lung doctor is Dr. Mortimer Fries Night-time eater; cookies and ice cream and cake and cheeseburgers; he is getting better at night Giving up pigs and cows  Already did the nutrition teaching; more would not be helpful for patient He is trying, actually trying, "really doing good" says his wife Likes [place] and chicken, ground, hormone-free; bunch of veggies; small potato, less starches Not drinking enough water Getting really sedentary He does not think more breathing medicine is needed Dr. Rockey Situ put him on PPI, one episode after onions  Depression screen [facility] 2/9 07/15/2018 06/24/2018 01/13/2018 11/16/2016 09/27/2016  Decreased Interest 0 0 0 0 0  Down, Depressed, Hopeless 0 0 1 0 0  PHQ - 2 Score 0 0 1 0 0  Altered sleeping 0 0 3 - -  Tired, decreased energy 0 0 1 - -  Change in appetite 0 0 1 - -  Feeling bad or failure about yourself  0 0 0 - -  Trouble concentrating 0 0 1 - -  Moving slowly or fidgety/restless 0 0 0 - -  Suicidal thoughts 0 0 0 - -  PHQ-9 Score 0 0 7 - -  Difficult doing work/chores Not difficult at all Not difficult at all Not difficult at all - -   Fall Risk  07/15/2018 06/24/2018 01/13/2018 11/16/2016 09/27/2016  Falls in the  past year? 0 1 No No No  Number falls in past yr: - 0 - - -  Injury with Fall? - 0 - - -    Relevant past medical, surgical, family and social history reviewed Past Medical History:  Diagnosis Date  . Anxiety   . Asthma   . Benign paroxysmal positional vertigo   . BPH (benign prostatic hypertrophy)   . Complication of anesthesia   . COPD (chronic obstructive pulmonary disease) ([place])   . Coronary artery disease    history of heart attack  . Degeneration of lumbar or lumbosacral intervertebral disc   . History of kidney stones   . Hyperlipidemia   . Hypertension   . MI (myocardial infarction) ([place])    1980s  . Obesity, unspecified   . PONV (postoperative nausea and vomiting)   . PTSD (post-traumatic stress disorder)   . Renal disorder    kidney stones  . Thoracic or lumbosacral neuritis or radiculitis, unspecified   . Unspecified congenital anomaly of the integument   . Unspecified gastritis and gastroduodenitis without mention of hemorrhage    Past Surgical History:  Procedure Laterality Date  . BACK SURGERY    . CARDIAC CATHETERIZATION  03/2009  [facility]: No significant coronary artery disease  . [facility]ATION  [facility]  . CARPAL TUNNEL RELEASE Right   . CHOLECYSTECTOMY    . COLONOSCOPY    . COLONOSCOPY WITH PROPOFOL N/A 10/31/2015   Procedure: COLONOSCOPY WITH PROPOFOL;  Surgeon: Lucilla Lame, MD;  Location: [place];  Service: Endoscopy;  Laterality: N/A;  . CYST EXCISION    . CYSTOSCOPY W/ URETERAL STENT PLACEMENT Left 10/26/2016   Procedure: CYSTOSCOPY WITH STENT REPLACEMENT;  Surgeon: Nickie Retort, MD;  Location: [facility];  Service: Urology;  Laterality: Left;  . CYSTOSCOPY WITH STENT PLACEMENT Left 10/05/2016   Procedure: CYSTOSCOPY WITH STENT PLACEMENT;  Surgeon: Nickie Retort, MD;  Location: [facility];  Service: Urology;  Laterality: Left;  . HERNIA REPAIR    . INGUINAL HERNIA REPAIR Right   . INGUINAL HERNIA REPAIR Left 01/26/2016     Procedure: HERNIA REPAIR INGUINAL ADULT;  Surgeon: Christene Lye, MD;  Location: [facility];  Service: General;  Laterality: Left;  . KNEE ARTHROSCOPY    . LUMBAR LAMINECTOMY    . POLYPECTOMY  10/31/2015   Procedure: POLYPECTOMY;  Surgeon: Lucilla Lame, MD;  Location: [place];  Service: Endoscopy;;  . URETEROSCOPY Left 10/05/2016   Procedure: URETEROSCOPY;  Surgeon: Nickie Retort, MD;  Location: [facility];  Service: Urology;  Laterality: Left;  . URETEROSCOPY WITH HOLMIUM LASER LITHOTRIPSY Left 10/26/2016   Procedure: URETEROSCOPY WITH HOLMIUM LASER LITHOTRIPSY;  Surgeon: Nickie Retort, MD;  Location: [facility];  Service: Urology;  Laterality: Left;  Marland Kitchen VASECTOMY     Family History  Problem Relation Age of Onset  . Kidney failure Mother   . Cancer Mother   . Cancer Father        lung  . Prostate cancer Neg Hx    Social History   Tobacco Use  . Smoking status: Former Smoker    Packs/day: 1.00    Years: 20.00    Pack years: 20.00    Types: Cigarettes    Last attempt to quit: 01/19/1979    Years since quitting: 39.5  . Smokeless tobacco: Former Systems developer    Types: Chew  Substance Use Topics  . Alcohol use: Yes    Alcohol/week: 0.0 standard drinks    Comment: Occasional Wine  . Drug use: No     [facility]  AUDIT-C Score  1      Interim medical history since last visit reviewed. Allergies and medications reviewed  Review of Systems Per HPI unless specifically indicated above     Objective:    BP 132/84   Pulse 89   Temp 97.7 F (36.5 C) (Oral)   Ht 5\' 6"  (1.676 m)   Wt 243 lb 1.6 oz (110.3 kg)   SpO2 95%   BMI 39.24 kg/m   Wt Readings from Last 3 Encounters:  07/15/18 243 lb 1.6 oz (110.3 kg)  06/30/18 239 lb (108.4 kg)  06/24/18 241 lb 1.6 oz (109.4 kg)  MD note: patient weighed today with heavy jeans and shoes; I think weight stable from last time  Physical Exam  Constitutional: He  appears well-developed and well-nourished. No distress.  obese  HENT:  Head: Normocephalic and atraumatic.  Eyes: EOM are normal. No scleral icterus.  Neck: No thyromegaly present.  Cardiovascular: Normal rate and regular rhythm.  Pulmonary/Chest: Effort normal and breath sounds normal.  Abdominal: He exhibits no distension.  Musculoskeletal: He exhibits no  edema.  Skin: Skin is warm. No pallor.  Psychiatric: He has a normal mood and affect. His behavior is normal. Judgment and thought content normal.       Assessment & Plan:   Problem List Items Addressed This Visit      Respiratory   Stage 2 moderate COPD by GOLD classification ([place]) - Primary (Chronic)    Managed by Dr. Mortimer Fries      Moderate persistent asthma    Managed by Dr. Mortimer Fries        Other   Morbid obesity [facility])    Encouragement given; drink more water; walk more; watch portions, etc.; he already did nutrition counseling; see AVS      Hyperlipidemia    Patient prefers to work on diet and weight loss       Other Visit Diagnoses    Atypical chest pain       patient has seen cardiology and pulmonology; he will see GI next week, considering EGD; wife thinks hiatal hernia cause       Follow up plan: Return in about 5 months (around 12/24/2018) for follow-up visit with Dr. Sanda Klein.  An after-visit summary was printed and given to the patient at [facility].  Please see the patient instructions which may contain other information and recommendations beyond what is mentioned above in the assessment and plan.  Face-to-face time with patient was more than 25 minutes, >50% time spent counseling and coordination of care

## 2018-07-15 NOTE — Assessment & Plan Note (Signed)
Managed by Dr. Mortimer Fries

## 2018-07-15 NOTE — Patient Instructions (Addendum)
Check out the information at familydoctor.org entitled "Nutrition for Weight Loss: What You Need to Know about Fad Diets" Try to lose between 1-2 pounds per week by taking in fewer calories and burning off more calories You can succeed by limiting portions, limiting foods dense in calories and fat, becoming more active, and drinking 8 glasses of water a day (64 ounces) Don't skip meals, especially breakfast, as skipping meals may alter your metabolism Do not use over-the-counter weight loss pills or gimmicks that claim rapid weight loss A healthy BMI (or body mass index) is between 18.5 and 24.9 You can calculate your ideal BMI at the [employer] website ClubMonetize.fr  Caution: prolonged use of proton pump inhibitors like omeprazole (Prilosec), pantoprazole (Protonix), esomeprazole (Nexium), and others like Dexilant and Aciphex may increase your risk of pneumonia, Clostridium difficile colitis, osteoporosis, anemia and other health complications Try to limit or avoid triggers like coffee, caffeinated beverages, onions, chocolate, spicy foods, peppermint, acidic foods like pizza, spaghetti sauce, and orange juice Lose weight if you are overweight or obese Try elevating the head of your bed by placing a small wedge between your mattress and box springs to keep acid in the stomach at night instead of coming up into your esophagus  Let us know if you are not taking omeprazole when you get home Please do follow-up with gastroenterology   Preventing Unhealthy Weight Gain, Adult Staying at a healthy weight is important. When fat builds up in your body, you may become overweight or obese. These conditions put you at greater risk for developing certain health problems, such as heart disease, diabetes, sleeping problems, joint problems, and some cancers. Unhealthy weight gain is often the result of making unhealthy choices in what you eat. It is also a result of  not getting enough exercise. You can make changes to your lifestyle to prevent obesity and stay as healthy as possible. What nutrition changes can be made? To maintain a healthy weight and prevent obesity:  Eat only as much as your body needs. To do this: ? Pay attention to signs that you are hungry or full. Stop eating as soon as you feel full. ? If you feel hungry, try drinking water first. Drink enough water so your urine is clear or pale yellow. ? Eat smaller portions. ? Look at serving sizes on food labels. Most foods contain more than one serving per container. ? Eat the recommended amount of calories for your gender and activity level. While most active people should eat around 2,000 calories per day, if you are trying to lose weight or are not very active, you main need to eat less calories. Talk to your health care provider or dietitian about how many calories you should eat each day.  Choose healthy foods, such as: ? Fruits and vegetables. Try to fill at least half of your plate at each meal with fruits and vegetables. ? Whole grains, such as whole wheat bread, brown rice, and quinoa. ? Lean meats, such as chicken or fish. ? Other healthy proteins, such as beans, eggs, or tofu. ? Healthy fats, such as nuts, seeds, fatty fish, and olive oil. ? Low-fat or fat-free dairy.  Check food labels and avoid food and drinks that: ? Are high in calories. ? Have added sugar. ? Are high in sodium. ? Have saturated fats or trans fats.  Limit how much you eat of the following foods: ? Prepackaged meals. ? Fast food. ? Fried foods. ? Processed meat, such as bacon,  sausage, and deli meats. ? Fatty cuts of red meat and poultry with skin.  Cook foods in healthier ways, such as by baking, broiling, or grilling.  When grocery shopping, try to shop around the outside of the store. This helps you buy mostly fresh foods and avoid canned and prepackaged foods.  What lifestyle changes can be  made?  Exercise at least 30 minutes 5 or more days each week. Exercising includes brisk walking, yard work, biking, running, swimming, and team sports like basketball and soccer. Ask your health care provider which exercises are safe for you.  Do not use any products that contain nicotine or tobacco, such as cigarettes and e-cigarettes. If you need help quitting, ask your health care provider.  Limit alcohol intake to no more than 1 drink a day for nonpregnant women and 2 drinks a day for men. One drink equals 12 oz of beer, 5 oz of wine, or 1 oz of hard liquor.  Try to get 7-9 hours of sleep each night. What other changes can be made?  Keep a food and activity journal to keep track of: ? What you ate and how many calories you had. Remember to count sauces, dressings, and side dishes. ? Whether you were active, and what exercises you did. ? Your calorie, weight, and activity goals.  Check your weight regularly. Track any changes. If you notice you have gained weight, make changes to your diet or activity routine.  Avoid taking weight-loss medicines or supplements. Talk to your health care provider before starting any new medicine or supplement.  Talk to your health care provider before trying any new diet or exercise plan. Why are these changes important? Eating healthy, staying active, and having healthy habits not only help prevent obesity, they also:  Help you to manage stress and emotions.  Help you to connect with friends and family.  Improve your self-esteem.  Improve your sleep.  Prevent long-term health problems.  What can happen if changes are not made? Being obese or overweight can cause you to develop joint or bone problems, which can make it hard for you to stay active or do activities you enjoy. Being obese or overweight also puts stress on your heart and lungs and can lead to health problems like diabetes, heart disease, and some cancers. Where to find more  information: Talk with your health care provider or a dietitian about healthy eating and healthy lifestyle choices. You may also find other information through these resources:  [place] Department of [employer] MyPlate: FormerBoss.no  American Heart Association: www.heart.org  Centers for Disease Control and Prevention: http://www.wolf.info/  Summary  Staying at a healthy weight is important. It helps prevent certain diseases and health problems, such as heart disease, diabetes, joint problems, sleep disorders, and some cancers.  Being obese or overweight can cause you to develop joint or bone problems, which can make it hard for you to stay active or do activities you enjoy.  You can prevent unhealthy weight gain by eating a healthy diet, exercising regularly, not smoking, limiting alcohol, and getting enough sleep.  Talk with your health care provider or a dietitian for guidance about healthy eating and healthy lifestyle choices. This information is not intended to replace advice given to you by your health care provider. Make sure you discuss any questions you have with your health care provider. Document Released: 07/24/2016 Document Revised: 08/29/2016 Document Reviewed: 08/29/2016 Elsevier Interactive Patient Education  2018 [employer].  Obesity, Adult Obesity is the  condition of having too much total body fat. Being overweight or obese means that your weight is greater than what is considered healthy for your body size. Obesity is determined by a measurement called BMI. BMI is an estimate of body fat and is calculated from height and weight. For adults, a BMI of 30 or higher is considered obese. Obesity can eventually lead to other health concerns and major illnesses, including:  Stroke.  Coronary artery disease (CAD).  Type 2 diabetes.  Some types of cancer, including cancers of the colon, breast, uterus, and gallbladder.  Osteoarthritis.  High blood pressure  (hypertension).  High cholesterol.  Sleep apnea.  Gallbladder stones.  Infertility problems.  What are the causes? The main cause of obesity is taking in (consuming) more calories than your body uses for energy. Other factors that contribute to this condition may include:  Being born with genes that make you more likely to become obese.  Having a medical condition that causes obesity. These conditions include: ? Hypothyroidism. ? Polycystic ovarian syndrome (PCOS). ? Binge-eating disorder. ? Cushing syndrome.  Taking certain medicines, such as steroids, antidepressants, and seizure medicines.  Not being physically active (sedentary lifestyle).  Living where there are limited places to exercise safely or buy healthy foods.  Not getting enough sleep.  What increases the risk? The following factors may increase your risk of this condition:  Having a family history of obesity.  Being a woman of African-American descent.  Being a man of Hispanic descent.  What are the signs or symptoms? Having excessive body fat is the main symptom of this condition. How is this diagnosed? This condition may be diagnosed based on:  Your symptoms.  Your medical history.  A physical exam. Your health care provider may measure: ? Your BMI. If you are an adult with a BMI between 25 and less than 30, you are considered overweight. If you are an adult with a BMI of 30 or higher, you are considered obese. ? The distances around your hips and your waist (circumferences). These may be compared to each other to help diagnose your condition. ? Your skinfold thickness. Your health care provider may gently pinch a fold of your skin and measure it.  How is this treated? Treatment for this condition often includes changing your lifestyle. Treatment may include some or all of the following:  Dietary changes. Work with your health care provider and a dietitian to set a weight-loss goal that is  healthy and reasonable for you. Dietary changes may include eating: ? Smaller portions. A portion size is the amount of a particular food that is healthy for you to eat at one time. This varies from person to person. ? Low-calorie or low-fat options. ? More whole grains, fruits, and vegetables.  Regular physical activity. This may include aerobic activity (cardio) and strength training.  Medicine to help you lose weight. Your health care provider may prescribe medicine if you are unable to lose 1 pound a week after 6 weeks of eating more healthily and doing more physical activity.  Surgery. Surgical options may include gastric banding and gastric bypass. Surgery may be done if: ? Other treatments have not helped to improve your condition. ? You have a BMI of 40 or higher. ? You have life-threatening health problems related to obesity.  Follow these instructions at home:  Eating and drinking   Follow recommendations from your health care provider about what you eat and drink. Your health care provider may advise  you to: ? Limit fast foods, sweets, and processed snack foods. ? Choose low-fat options, such as low-fat milk instead of whole milk. ? Eat 5 or more servings of fruits or vegetables every day. ? Eat at home more often. This gives you more control over what you eat. ? Choose healthy foods when you eat out. ? Learn what a healthy portion size is. ? Keep low-fat snacks on hand. ? Avoid sugary drinks, such as soda, fruit juice, iced tea sweetened with sugar, and flavored milk. ? Eat a healthy breakfast.  Drink enough water to keep your urine clear or pale yellow.  Do not go without eating for long periods of time (do not fast) or follow a fad diet. Fasting and fad diets can be unhealthy and even dangerous. Physical Activity  Exercise regularly, as told by your health care provider. Ask your health care provider what types of exercise are safe for you and how often you should  exercise.  Warm up and stretch before being active.  Cool down and stretch after being active.  Rest between periods of activity. Lifestyle  Limit the time that you spend in front of your TV, computer, or video game system.  Find ways to reward yourself that do not involve food.  Limit alcohol intake to no more than 1 drink a day for nonpregnant women and 2 drinks a day for men. One drink equals 12 oz of beer, 5 oz of wine, or 1 oz of hard liquor. General instructions  Keep a weight loss journal to keep track of the food you eat and how much you exercise you get.  Take over-the-counter and prescription medicines only as told by your health care provider.  Take vitamins and supplements only as told by your health care provider.  Consider joining a support group. Your health care provider may be able to recommend a support group.  Keep all follow-up visits as told by your health care provider. This is important. Contact a health care provider if:  You are unable to meet your weight loss goal after 6 weeks of dietary and lifestyle changes. This information is not intended to replace advice given to you by your health care provider. Make sure you discuss any questions you have with your health care provider. Document Released: 08/30/2004 Document Revised: 12/26/2015 Document Reviewed: 05/11/2015 Elsevier Interactive Patient Education  2018 [employer].

## 2018-07-15 NOTE — Assessment & Plan Note (Signed)
Patient prefers to work on diet and weight loss

## 2018-07-15 NOTE — Assessment & Plan Note (Signed)
Encouragement given; drink more water; walk more; watch portions, etc.; he already did nutrition counseling; see AVS

## 2018-07-16 LAB — PSA: PROSTATE SPECIFIC AG, SERUM: 1.8 ng/mL (ref 0.0–4.0)

## 2018-07-17 ENCOUNTER — Encounter: Payer: Self-pay | Admitting: Urology

## 2018-07-17 ENCOUNTER — Ambulatory Visit (INDEPENDENT_AMBULATORY_CARE_PROVIDER_SITE_OTHER): Payer: Federal, State, Local not specified - PPO | Admitting: Urology

## 2018-07-17 ENCOUNTER — Ambulatory Visit
Admission: RE | Admit: 2018-07-17 | Discharge: 2018-07-17 | Disposition: A | Discharge status: Home / Self Care | Payer: Federal, State, Local not specified - PPO | Source: Ambulatory Visit | Attending: Urology | Admitting: Urology

## 2018-07-17 ENCOUNTER — Ambulatory Visit
Admission: RE | Admit: 2018-07-17 | Discharge: 2018-07-17 | Disposition: A | Discharge status: Home / Self Care | Payer: Federal, State, Local not specified - PPO | Attending: Urology | Admitting: Urology

## 2018-07-17 VITALS — BP 154/108 | HR 82 | Ht 66.0 in | Wt 244.1 lb

## 2018-07-17 DIAGNOSIS — N138 Other obstructive and reflux uropathy: Secondary | ICD-10-CM | POA: Diagnosis not present

## 2018-07-17 DIAGNOSIS — N200 Calculus of kidney: Secondary | ICD-10-CM

## 2018-07-17 DIAGNOSIS — N401 Enlarged prostate with lower urinary tract symptoms: Secondary | ICD-10-CM | POA: Diagnosis not present

## 2018-07-17 NOTE — Progress Notes (Signed)
07/17/2018  4:23 PM   Willie Crane 25-Oct-1945 [id number]  Referring provider: Arnetha Courser, MD [address] [place], [place] [address]  Chief Complaint  Patient presents with  . Nephrolithiasis    HPI: Willie Crane is a 72 y.o. Caucasian male who presents today for his annual follow up for benign prostatic hypertrophy. He has a history of bilateral nephrolithiasis.  BPH with LUTS (prostate and/or bladder) His most recent PSA was 1.8 ng/mL on 07/15/2018, it is stable.  Today, 07/17/2018, his I-PSS is 3, which is mild lower urinary tract symptomatology. Last year, his I-PSS was 13/2. He is currently delighted with his quality of life due to his urinary symptoms. His I-PSS is detailed below.  He admits he has no major complaints.  Historically, his major complaints were intermittency and a weak stream for a few years.  He is still taking finasteride.  He denies any dysuria, hematuria and suprapubic/flank pain. He also denies any recent fevers, chills, nausea or vomiting.  He does not have a family history of PCa. IPSS    Row Name 07/17/18 1500         International Prostate Symptom Score   How often have you had the sensation of not emptying your bladder?  Less than 1 in 5     How often have you had to urinate less than every two hours?  Not at All     How often have you found you stopped and started again several times when you urinated?  Less than 1 in 5 times     How often have you found it difficult to postpone urination?  Not at All     How often have you had a weak urinary stream?  Not at All     How often have you had to strain to start urination?  Not at All     How many times did you typically get up at night to urinate?  1 Time     Total IPSS Score  3       Quality of Life due to urinary symptoms   If you were to spend the rest of your life with your urinary condition just the way it is now how would you feel about that?  Delighted          Bilateral nephrolithiasis Contrast CT performed in 11/2016 noted small bilateral stones.  He has no flank pain or gross hematuria.    PMH: Past Medical History:  Diagnosis Date  . Anxiety   . Asthma   . Benign paroxysmal positional vertigo   . BPH (benign prostatic hypertrophy)   . Complication of anesthesia   . COPD (chronic obstructive pulmonary disease) ([place])   . Coronary artery disease    history of heart attack  . Degeneration of lumbar or lumbosacral intervertebral disc   . History of kidney stones   . Hyperlipidemia   . Hypertension   . MI (myocardial infarction) ([place])    1980s  . Obesity, unspecified   . PONV (postoperative nausea and vomiting)   . PTSD (post-traumatic stress disorder)   . Renal disorder    kidney stones  . Thoracic or lumbosacral neuritis or radiculitis, unspecified   . Unspecified congenital anomaly of the integument   . Unspecified gastritis and gastroduodenitis without mention of hemorrhage     Surgical History: Past Surgical History:  Procedure Laterality Date  . BACK SURGERY    . CARDIAC CATHETERIZATION  03/2009  [facility]: No significant coronary artery disease  . CARDIAC CATHETERIZATION  01/2014   [facility]  . CARPAL TUNNEL RELEASE Right   . CHOLECYSTECTOMY    . COLONOSCOPY    . COLONOSCOPY WITH PROPOFOL N/A 10/31/2015   Procedure: COLONOSCOPY WITH PROPOFOL;  Surgeon: Lucilla Lame, MD;  Location: [place];  Service: Endoscopy;  Laterality: N/A;  . CYST EXCISION    . CYSTOSCOPY W/ URETERAL STENT PLACEMENT Left 10/26/2016   Procedure: CYSTOSCOPY WITH STENT REPLACEMENT;  Surgeon: Nickie Retort, MD;  Location: [facility];  Service: Urology;  Laterality: Left;  . CYSTOSCOPY WITH STENT PLACEMENT Left 10/05/2016   Procedure: CYSTOSCOPY WITH STENT PLACEMENT;  Surgeon: Nickie Retort, MD;  Location: [facility];  Service: Urology;  Laterality: Left;  . HERNIA REPAIR    . INGUINAL HERNIA REPAIR Right   . INGUINAL HERNIA REPAIR Left 01/26/2016    Procedure: HERNIA REPAIR INGUINAL ADULT;  Surgeon: Christene Lye, MD;  Location: [facility];  Service: General;  Laterality: Left;  . KNEE ARTHROSCOPY    . LUMBAR LAMINECTOMY    . POLYPECTOMY  10/31/2015   Procedure: POLYPECTOMY;  Surgeon: Lucilla Lame, MD;  Location: [place];  Service: Endoscopy;;  . URETEROSCOPY Left 10/05/2016   Procedure: URETEROSCOPY;  Surgeon: Nickie Retort, MD;  Location: [facility];  Service: Urology;  Laterality: Left;  . URETEROSCOPY WITH HOLMIUM LASER LITHOTRIPSY Left 10/26/2016   Procedure: URETEROSCOPY WITH HOLMIUM LASER LITHOTRIPSY;  Surgeon: Nickie Retort, MD;  Location: [facility];  Service: Urology;  Laterality: Left;  Marland Kitchen VASECTOMY      Home Medications:  Allergies as of 07/17/2018      Reactions   Other Swelling   Lips swell - Potatoes, Kiwi, carrots, celery (raw) general anesthesia - nausea       Medication List       Accurate as of July 17, 2018  4:23 PM. Always use your most recent med list.        acetaminophen 325 MG tablet Commonly known as:  TYLENOL Take 2 tablets (650 mg total) by mouth every 6 (six) hours as needed for mild pain or fever.   albuterol 108 (90 Base) MCG/ACT inhaler Commonly known as:  PROVENTIL HFA Inhale 2 puffs into the lungs every 4 (four) hours as needed for wheezing or shortness of breath.   ALLERGY EYE DROPS OP Apply 1 drop to eye daily as needed (allergies).   aspirin EC 81 MG tablet Take 1 tablet (81 mg total) by mouth daily.   atorvastatin 10 MG tablet Commonly known as:  LIPITOR TAKE 1 TABLET BY MOUTH EVERYDAY AT BEDTIME   finasteride 5 MG tablet Commonly known as:  PROSCAR Take 1 tablet (5 mg total) by mouth daily.   FLUoxetine 20 MG capsule Commonly known as:  PROZAC TAKE 1 CAPSULE BY MOUTH EVERY DAY   MULTI ADULT GUMMIES Chew Chew 1 tablet by mouth daily.   olmesartan 20 MG tablet Commonly known as:  BENICAR Take 1 tablet (20 mg total) by mouth daily. Waiting on  lab results   omeprazole 20 MG capsule Commonly known as:  PRILOSEC Take 1 capsule (20 mg total) by mouth 2 (two) times daily before a meal.   SYMBICORT 160-4.5 MCG/ACT inhaler Generic drug:  budesonide-formoterol Inhale 2 puffs into the lungs 2 (two) times daily.   traZODone 50 MG tablet Commonly known as:  DESYREL Take 0.5-1 tablets (25-50 mg total) by mouth at bedtime as needed for sleep.  triamcinolone ointment 0.5 % Commonly known as:  KENALOG Apply 1 application topically 2 (two) times daily.   Vitamin C Chew Take 240 mgs once daily      Allergies:  Allergies  Allergen Reactions  . Other Swelling    Lips swell - Potatoes, Kiwi, carrots, celery (raw)  general anesthesia - nausea    Family History: Family History  Problem Relation Age of Onset  . Kidney failure Mother   . Cancer Mother   . Cancer Father        lung  . Prostate cancer Neg Hx    Social History:  reports that he quit smoking about 39 years ago. His smoking use included cigarettes. He has a 20.00 pack-year smoking history. He has quit using smokeless tobacco.  His smokeless tobacco use included chew. He reports current alcohol use. He reports that he does not use drugs.  ROS: UROLOGY Frequent Urination?: No Hard to postpone urination?: No Burning/pain with urination?: No Get up at night to urinate?: No Leakage of urine?: No Urine stream starts and stops?: No Trouble starting stream?: No Do you have to strain to urinate?: No Blood in urine?: No Urinary tract infection?: No Sexually transmitted disease?: No Injury to kidneys or bladder?: No Painful intercourse?: No Weak stream?: No Erection problems?: No Penile pain?: No  Gastrointestinal Nausea?: No Vomiting?: No Indigestion/heartburn?: No Diarrhea?: No Constipation?: No  Constitutional Fever: No Night sweats?: No Weight loss?: No Fatigue?: No  Skin Skin rash/lesions?: No Itching?: No  Eyes Blurred vision?: No Double  vision?: No  Ears/Nose/Throat Sore throat?: No Sinus problems?: No  Hematologic/Lymphatic Swollen glands?: No Easy bruising?: No  Cardiovascular Leg swelling?: No Chest pain?: No  Respiratory Cough?: Yes Shortness of breath?: No  Endocrine Excessive thirst?: No  Musculoskeletal Back pain?: No Joint pain?: No  Neurological Headaches?: No Dizziness?: No  Psychologic Depression?: No Anxiety?: No  Physical Exam: BP (!) 154/108 (BP Location: Left Arm, Patient Position: Sitting, Cuff Size: Normal)   Pulse 82   Ht 5\' 6"  (1.676 m)   Wt 244 lb 1.6 oz (110.7 kg)   BMI 39.40 kg/m   Constitutional:  Alert and oriented, No acute distress. Respiratory: Normal respiratory effort, no increased work of breathing. GI: Abdomen is soft, nontender, nondistended, no abdominal masses GU: No CVA tenderness.  No bladder fullness or masses.  Patient with buried phallus.  Urethral meatus is patent.  No penile discharge. No penile lesions or rashes. Scrotum without lesions, cysts, rashes and/or edema.  Testicles are located scrotally bilaterally. No masses are appreciated in the testicles. Left and right epididymis are normal. Rectal: Patient with normal sphincter tone. Anus and perineum without scarring or rashes. No rectal masses are appreciated. Could only palpate apex.No nodules are appreciated.  Skin: No rashes, bruises or suspicious lesions. Neurologic: Grossly intact, no focal deficits, moving all 4 extremities. Psychiatric: Normal mood and affect.  Laboratory Data: Lab Results  Component Value Date   WBC 8.2 06/24/2018   HGB 16.1 06/24/2018   HCT 48.1 06/24/2018   MCV 89.6 06/24/2018   PLT 223 06/24/2018   Lab Results  Component Value Date   CREATININE 1.13 06/24/2018   Results for ASON, HESLIN" (MRN [id number]) as of 07/17/2018 15:50  Ref. Range 07/11/2015 13:33 07/06/2016 13:41 07/09/2017 14:59 07/15/2018 15:42  Prostate Specific Ag, Serum Latest Ref Range: 0.0 -  4.0 ng/mL 1.4 (2.8) 1.3 (2.6) 1.5 (3.0) 1.8 (3.6)  I have reviewed the labs.  Pertinent Imaging CLINICAL DATA:  Kidney stones.  Follow-up  EXAM: ABDOMEN - 1 VIEW  COMPARISON:  CT 11/04/2016  FINDINGS: No visible renal or ureteral stones. Previously seen left ureteral stent on CT has been removed. Nonobstructive bowel gas pattern. Prior cholecystectomy.  IMPRESSION: No visible suspicious calcification.   Electronically Signed   By: Rolm Baptise M.D.   On: 07/18/2018 08:12  I have independently reviewed the films and do not seen stones.   Assessment & Plan:    1. BPH with LUTS (prostate and/or bladder)  - I-PSS today is 3/0, it has improved substantially.   - Patient states he is pleased with his quality of life due to his urinary symptoms. He admits that he has accepted certain urinary changes that have come with his age.  - Continue conservative management, avoiding bladder irritants and timed voidings  - His most recent PSA was 1.8 (07/15/2018), it is stable.  - Continue finasteride  - Discussed that there have been some recent studies associated with finasteride that have findings indicating the possibility of people taking it may lead to higher grade prostate cancers  - Discussed discontinuing PSA surveillance as currently the AUA panel does not recommend surveillance for men age 38+ years or any man with less than a 10-15 year life expectancy, although some men age 46+ years who are in excellent health may benefit from prostate cancer screening. Detailed the pros and cons of continuing screening. I recommend continued PSA surveillance.  - RTC in 1 year for IPSS, PSA, and exam   2. History of bilateral nephrolithiasis  - Surveillance KUB today, results pending. Will update patient with results.    Return in about 1 year (around 07/18/2019) for IPSS, KUB, PSA and exam.  Laneta Simmers  [facility] [address], [place], [place] [address] [phone]  I, Temidayo Atanda-Ogunleye , am acting as a scribe for Nori Riis, PA-C  I have reviewed the above documentation for accuracy and completeness, and I agree with the above.    Zara Council, PA-C

## 2018-07-18 ENCOUNTER — Telehealth: Payer: Self-pay

## 2018-07-18 NOTE — Telephone Encounter (Signed)
-----   Message from Nori Riis, PA-C sent at 07/18/2018  8:35 AM EST ----- Please let Mr. Hochstetler know that his xray was negative for stones.

## 2018-07-18 NOTE — Telephone Encounter (Signed)
Called pt informed him of results below. Pt gave verbal understanding.  

## 2018-07-19 ENCOUNTER — Other Ambulatory Visit: Payer: Self-pay | Admitting: Urology

## 2018-07-21 ENCOUNTER — Encounter

## 2018-07-21 ENCOUNTER — Ambulatory Visit: Payer: Federal, State, Local not specified - PPO | Admitting: Cardiovascular Disease

## 2018-07-22 ENCOUNTER — Ambulatory Visit: Payer: Federal, State, Local not specified - PPO | Admitting: Gastroenterology

## 2018-07-22 ENCOUNTER — Encounter: Payer: Self-pay | Admitting: Gastroenterology

## 2018-07-22 VITALS — BP 156/85 | HR 99 | Ht 66.0 in | Wt 243.4 lb

## 2018-07-22 DIAGNOSIS — R0789 Other chest pain: Secondary | ICD-10-CM

## 2018-07-22 NOTE — Progress Notes (Signed)
Gastroenterology Consultation  Referring Provider:     Arnetha Courser, MD Primary Care Physician:  Arnetha Courser, MD Primary Gastroenterologist:  Dr. Allen Norris     Reason for Consultation:     Atypical chest pain        HPI:   Willie Crane is a 72 y.o. y/o male referred for consultation & management of Atypical chest pain by Dr. Sanda Klein, Satira Anis, MD.  This patient comes in today with a history of chest pain.  The patient was sent to cardiology who had told the patient that it was unlikely his heart causing of the chest pain and was recommended to come see GI.  The patient reports he has had significant weight gain recently.  He then reports that he was started on a PPI. The patient states that since starting the PPI his symptoms have completely resolved.  He is no longer having chest pain.  There is no report of any dysphagia.  He also denies any unexplained weight loss, fevers, chills, nausea or vomiting.  The patient is devoid of any worrisome symptoms such as melena or hematemesis.  The patient also had a colonoscopy by me in 2017 with an adenomatous polyp found.  I'm now being asked to see the patient for atypical chest pain.  Past Medical History:  Diagnosis Date  . Anxiety   . Asthma   . Benign paroxysmal positional vertigo   . BPH (benign prostatic hypertrophy)   . Complication of anesthesia   . COPD (chronic obstructive pulmonary disease) ([place])   . Coronary artery disease    history of heart attack  . Degeneration of lumbar or lumbosacral intervertebral disc   . History of kidney stones   . Hyperlipidemia   . Hypertension   . MI (myocardial infarction) ([place])    1980s  . Obesity, unspecified   . PONV (postoperative nausea and vomiting)   . PTSD (post-traumatic stress disorder)   . Renal disorder    kidney stones  . Thoracic or lumbosacral neuritis or radiculitis, unspecified   . Unspecified congenital anomaly of the integument   . Unspecified gastritis and  gastroduodenitis without mention of hemorrhage     Past Surgical History:  Procedure Laterality Date  . BACK SURGERY    . CARDIAC CATHETERIZATION  03/2009   [facility]: No significant coronary artery disease  . CARD[facility]AC CAT[facility]IZATION  [facility]/2014   [facility]  . CARPAL TUNNEL RELEASE Right   . CHOLECYSTECTOMY    . COLONOSCOPY    . COLONOSCOPY WITH PROPOFOL N/A 10/31/2015   Procedure: COLONOSCOPY WITH PROPOFOL;  Surgeon: Lucilla Lame, MD;  Location: [place];  Service: Endoscopy;  Laterality: N/A;  . CYST EXCISION    . CYSTOSCOPY W/ URETERAL STENT PLACEMENT Left 10/26/2016   Procedure: CYSTOSCOPY WITH STENT REPLACEMENT;  Surgeon: Nickie Retort, MD;  Location: [facility];  Service: Urology;  Laterality: Left;  . CYSTOSCOPY WITH STENT PLACEMENT Left 10/05/2016   Procedure: CYSTOSCOPY WITH STENT PLACEMENT;  Surgeon: Nickie Retort, MD;  Location: [facility];  Service: Urology;  Laterality: Left;  . HERNIA REPAIR    . INGUINAL HERNIA REPAIR Right   . INGUINAL HERNIA REPAIR Left 01/26/2016   Procedure: HERNIA REPAIR INGUINAL ADULT;  Surgeon: Christene Lye, MD;  Location: [facility];  Service: General;  Laterality: Left;  . KNEE ARTHROSCOPY    . LUMBAR LAMINECTOMY    . POLYPECTOMY  10/31/2015   Procedure: POLYPECTOMY;  Surgeon: Lucilla Lame, MD;  Location: [place];  Service: Endoscopy;;  . URETEROSCOPY Left 10/05/2016   Procedure: URETEROSCOPY;  Surgeon: Nickie Retort, MD;  Location: [facility];  Service: Urology;  Laterality: Left;  . URETEROSCOPY WITH HOLMIUM LASER LITHOTRIPSY Left 10/26/2016   Procedure: URETEROSCOPY WITH HOLMIUM LASER LITHOTRIPSY;  Surgeon: Nickie Retort, MD;  Location: [facility];  Service: Urology;  Laterality: Left;  Marland Kitchen VASECTOMY      Prior to Admission medications   Medication Sig Start Date End Date Taking? Authorizing Provider  albuterol (PROVENTIL HFA) 108 (90 Base) MCG/ACT inhaler Inhale 2 puffs into the lungs every 4 (four) hours as needed for  wheezing or shortness of breath. 10/11/17  Yes Flora Lipps, MD  aspirin EC 81 MG tablet Take 1 tablet (81 mg total) by mouth daily. 06/24/18  Yes Lada, Satira Anis, MD  atorvastatin (LIPITOR) 10 MG tablet TAKE 1 TABLET BY MOUTH EVERYDAY AT BEDTIME 07/14/18  Yes Lada, Satira Anis, MD  Bioflavonoid Products (VITAMIN C) CHEW Take 240 mgs once daily   Yes [provider]  finasteride (PROSCAR) 5 MG tablet TAKE 1 TABLET BY MOUTH EVERY DAY 07/21/18  Yes McGowan, Shannon A, PA-C  FLUoxetine (PROZAC) 20 MG capsule TAKE 1 CAPSULE BY MOUTH EVERY DAY 06/17/18  Yes Lada, Satira Anis, MD  Ketotifen Fumarate (ALLERGY EYE DROPS OP) Apply 1 drop to eye daily as needed (allergies).   Yes [provider]  olmesartan (BENICAR) 20 MG tablet Take 1 tablet (20 mg total) by mouth daily. Waiting on lab results 03/25/18  Yes Lada, Satira Anis, MD  omeprazole (PRILOSEC) 20 MG capsule Take 1 capsule (20 mg total) by mouth 2 (two) times daily before a meal. 06/30/18  Yes Gollan, Kathlene November, MD  SYMBICORT 160-4.5 MCG/ACT inhaler Inhale 2 puffs into the lungs 2 (two) times daily. 10/21/17  Yes Flora Lipps, MD  acetaminophen (TYLENOL) 325 MG tablet Take 2 tablets (650 mg total) by mouth every 6 (six) hours as needed for mild pain or fever. Patient not taking: Reported on 07/22/2018 11/07/16   Nicholes Mango, MD  Multiple Vitamins-Minerals (MULTI ADULT GUMMIES) CHEW Chew 1 tablet by mouth daily.    [provider]  traZODone (DESYREL) 50 MG tablet Take 0.5-1 tablets (25-50 mg total) by mouth at bedtime as needed for sleep. Patient not taking: Reported on 07/22/2018 01/13/18   Arnetha Courser, MD  triamcinolone ointment (KENALOG) 0.5 % Apply 1 application topically 2 (two) times daily. Patient not taking: Reported on 07/22/2018 01/13/18   Arnetha Courser, MD    Family History  Problem Relation Age of Onset  . Kidney failure Mother   . Cancer Mother   . Cancer Father        lung  . Prostate cancer Neg Hx       Social History   Tobacco Use  . Smoking status: Former Smoker    Packs/day: 1.00    Years: 20.00    Pack years: 20.00    Types: Cigarettes    Last attempt to quit: 01/19/1979    Years since quitting: 39.5  . Smokeless tobacco: Former Systems developer    Types: Chew  Substance Use Topics  . Alcohol use: Yes    Alcohol/week: 0.0 standard drinks    Comment: Occasional Wine  . Drug use: No    Allergies as of 07/22/2018 - Review Complete 07/22/2018  Allergen Reaction Noted  . Other Swelling 02/04/2014    Review of Systems:    All systems reviewed and  negative except where noted in HPI.   Physical Exam:  BP (!) 156/85   Pulse 99   Ht 5\' 6"  (1.676 m)   Wt 243 lb 6.4 oz (110.4 kg)   BMI 39.29 kg/m  No LMP for male patient. General:   Alert,  Well-developed, well-nourished, pleasant and cooperative in NAD Head:  Normocephalic and atraumatic. Eyes:  Sclera clear, no icterus.   Conjunctiva pink. Ears:  Normal auditory acuity. Nose:  No deformity, discharge, or lesions. Mouth:  No deformity or lesions,oropharynx pink & moist. Neck:  Supple; no masses or thyromegaly. Lungs:  Respirations even and unlabored.  Clear throughout to auscultation.   No wheezes, crackles, or rhonchi. No acute distress. Heart:  Regular rate and rhythm; no murmurs, clicks, rubs, or gallops. Abdomen:  Normal bowel sounds.  No bruits.  Soft, non-tender and non-distended without masses, hepatosplenomegaly or hernias noted.  No guarding or rebound tenderness.  Negative Carnett sign.   Rectal:  Deferred.  Msk:  Symmetrical without gross deformities.  Good, equal movement & strength bilaterally. Pulses:  Normal pulses noted. Extremities:  No clubbing or edema.  No cyanosis. Neurologic:  Alert and oriented x3;  grossly normal neurologically. Skin:  Intact without significant lesions or rashes.  No jaundice. Lymph Nodes:  No significant cervical adenopathy. Psych:  Alert and cooperative. Normal mood and  affect.  Imaging Studies: Dg Chest 2 View  Result Date: 06/24/2018 CLINICAL DATA:  Chest pain and shortness of breath for several weeks. EXAM: CHEST - 2 VIEW COMPARISON:  02/12/2017 FINDINGS: The heart size and mediastinal contours are within normal limits. Pulmonary hyperinflation again seen. Pleural-parenchymal scarring at left lung base is unchanged. No evidence of pulmonary infiltrate or edema. The visualized skeletal structures are unremarkable. IMPRESSION: Stable exam.  COPD.  No active cardiopulmonary disease. Electronically Signed   By: Earle Gell M.D.   On: 06/24/2018 22:09   Abdomen 1 View (kub)  Result Date: 07/18/2018 CLINICAL DATA:  Kidney stones.  Follow-up EXAM: ABDOMEN - 1 VIEW COMPARISON:  CT 11/04/2016 FINDINGS: No visible renal or ureteral stones. Previously seen left ureteral stent on CT has been removed. Nonobstructive bowel gas pattern. Prior cholecystectomy. IMPRESSION: No visible suspicious calcification. Electronically Signed   By: Rolm Baptise M.D.   On: 07/18/2018 08:12    Assessment and Plan:   SYRUS NAKAMA is a 72 y.o. y/o male who comes in with atypical chest pain that has been treated with a PPI.  The patient states that his symptoms have completely resolved.  In light of this patient having no further chest pain and has responded well to a PPI there is no indication for any endoscopy at this time since there is a lack of watery symptoms. The patient has been told that if his symptoms return or if he has the development of any worrisome symptoms such as black stools dysphagia or unexplained weight loss he should contact my office and then should be set up for a upper endoscopy.  The patient has also been encouraged to try and lose weight which may also help with his reflux.  The patient has been explained the plan and agrees with it.  Lucilla Lame, MD. Marval Regal    Note: This dictation was prepared with Dragon dictation along with smaller phrase technology. Any  transcriptional errors that result from this process are unintentional.

## 2018-07-24 ENCOUNTER — Other Ambulatory Visit: Payer: Self-pay | Admitting: Cardiovascular Disease

## 2018-07-25 ENCOUNTER — Other Ambulatory Visit: Payer: Self-pay | Admitting: Family Medicine

## 2018-08-14 ENCOUNTER — Other Ambulatory Visit: Payer: Self-pay | Admitting: Family Medicine

## 2018-08-29 ENCOUNTER — Other Ambulatory Visit: Payer: Self-pay | Admitting: Family Medicine

## 2018-09-05 ENCOUNTER — Telehealth: Payer: Self-pay | Admitting: Internal Medicine

## 2018-09-05 ENCOUNTER — Encounter: Payer: Self-pay | Admitting: Internal Medicine

## 2018-09-05 ENCOUNTER — Ambulatory Visit: Payer: Federal, State, Local not specified - PPO | Admitting: Internal Medicine

## 2018-09-05 VITALS — BP 146/82 | HR 92 | Ht 66.0 in | Wt 254.4 lb

## 2018-09-05 DIAGNOSIS — J441 Chronic obstructive pulmonary disease with (acute) exacerbation: Secondary | ICD-10-CM

## 2018-09-05 MED ORDER — IPRATROPIUM-ALBUTEROL 0.5-2.5 (3) MG/3ML IN SOLN
3.0000 mL | Freq: Once | RESPIRATORY_TRACT | Status: AC
  Administered 2018-09-05: 3 mL via RESPIRATORY_TRACT

## 2018-09-05 MED ORDER — PREDNISONE 20 MG PO TABS: 20.0000 mg | ORAL_TABLET | Freq: Every day | ORAL | 0 refills | Status: DC

## 2018-09-05 MED ORDER — AZITHROMYCIN 250 MG PO TABS: ORAL_TABLET | ORAL | 0 refills | Status: DC

## 2018-09-05 NOTE — Telephone Encounter (Signed)
OV scheduled this pm with DK.

## 2018-09-05 NOTE — Addendum Note (Signed)
Addended by: Stephanie Coup on: 09/05/2018 04:31 PM   Modules accepted: Orders

## 2018-09-05 NOTE — Progress Notes (Signed)
[facility] Consultation      Date: 09/05/2018,   MRN# [id number] VENIAMIN KINCAID August 31, 1945     Admission                  Current  Willie Crane is a 73 y.o. old male seen in consultation for cough/congestion at the request of Dr. Sanda Klein.  Previous history 73 yo white male with dx of ASTHMA in his 23's-he is [place] Vet Patient has been on inhaled steroids for many years. Patient has chronic fatigue and SOB and DOE for very long time Quit smoking 40 years ago Smoked 1 ppd for 20 years Worked as Gaffer, retired 5 years now Has gained 60 pounds in 4 years  Arley shows ratio 68% Fev1 1.8L 64% fef25/75 50% Moderate Obstructive and Restrictive lung disease   CHIEF COMPLAINT:   Follow-up COPD Follow up Moderate COPD  HISTORY OF PRESENT ILLNESS   Positive for chest congestion Positive for cough Positive for wheezing  increased shortness of breath Positive signs of COPD exacerbation  No lower extremity swelling No signs of CHF at this time  Patient does not want sleep study and refuses oxygen therapy if needed  Will give DOUNEB in office now for wheezing   Current Medication:  Current Outpatient Medications:  .  acetaminophen (TYLENOL) 325 MG tablet, Take 2 tablets (650 mg total) by mouth every 6 (six) hours as needed for mild pain or fever. (Patient not taking: Reported on 07/22/2018), Disp: , Rfl:  .  albuterol (PROVENTIL HFA) 108 (90 Base) MCG/ACT inhaler, Inhale 2 puffs into the lungs every 4 (four) hours as needed for wheezing or shortness of breath., Disp: 3 Inhaler, Rfl: 10 .  aspirin EC 81 MG tablet, Take 1 tablet (81 mg total) by mouth daily., Disp: , Rfl:  .  atorvastatin (LIPITOR) 10 MG tablet, TAKE 1 TABLET BY MOUTH EVERYDAY AT BEDTIME, Disp: 30 tablet, Rfl: 1 .  Bioflavonoid Products (VITAMIN C) CHEW, Take 240 mgs once daily, Disp: , Rfl:  .  finasteride (PROSCAR) 5 MG tablet, TAKE 1 TABLET BY MOUTH EVERY DAY, Disp: 90  tablet, Rfl: 3 .  FLUoxetine (PROZAC) 20 MG capsule, TAKE 1 CAPSULE BY MOUTH EVERY DAY, Disp: 90 capsule, Rfl: 1 .  Ketotifen Fumarate (ALLERGY EYE DROPS OP), Apply 1 drop to eye daily as needed (allergies)., Disp: , Rfl:  .  Multiple Vitamins-Minerals (MULTI ADULT GUMMIES) CHEW, Chew 1 tablet by mouth daily., Disp: , Rfl:  .  olmesartan (BENICAR) 20 MG tablet, Take 1 tablet (20 mg total) by mouth daily. Waiting on lab results, Disp: 90 tablet, Rfl: 3 .  omeprazole (PRILOSEC) 20 MG capsule, TAKE 1 CAPSULE (20 MG TOTAL) BY MOUTH 2 (TWO) TIMES DAILY BEFORE A MEAL., Disp: 60 capsule, Rfl: 3 .  SYMBICORT 160-4.5 MCG/ACT inhaler, Inhale 2 puffs into the lungs 2 (two) times daily., Disp: 3 Inhaler, Rfl: 2 .  traZODone (DESYREL) 50 MG tablet, TAKE 0.5-1 TABLETS (25-50 MG TOTAL) BY MOUTH AT BEDTIME AS NEEDED FOR SLEEP., Disp: 30 tablet, Rfl: 5 .  triamcinolone ointment (KENALOG) 0.5 %, Apply 1 application topically 2 (two) times daily. (Patient not taking: Reported on 07/22/2018), Disp: 30 g, Rfl: 2    ALLERGIES   Other     REVIEW OF SYSTEMS   Review of Systems  Constitutional: Positive for malaise/fatigue. Negative for chills, diaphoresis, fever and weight loss.  HENT: Negative for congestion and hearing loss.   Eyes: Negative for  blurred vision and double vision.  Respiratory: Positive for cough, shortness of breath and wheezing. Negative for hemoptysis and sputum production.   Cardiovascular: Negative for chest pain, palpitations, orthopnea and leg swelling.  Gastrointestinal: Negative for abdominal pain, heartburn, nausea and vomiting.  Skin: Negative for rash.  Neurological: Negative for weakness and headaches.  All other systems reviewed and are negative.   BP (!) 146/82 (BP Location: Left Arm, Cuff Size: Large)   Pulse 92   Ht 5\' 6"  (1.676 m)   Wt 254 lb 6.4 oz (115.4 kg)   SpO2 94%   BMI 41.06 kg/m    PHYSICAL EXAM  Physical Exam  Constitutional: He is oriented to person,  place, and time. He appears well-developed and well-nourished. No distress.  HENT:  Mouth/Throat: No oropharyngeal exudate.  Neck: Neck supple.  Cardiovascular: Normal rate, regular rhythm and normal heart sounds.  No murmur heard. Pulmonary/Chest: Effort normal. No stridor. No respiratory distress. He has wheezes.  Musculoskeletal: Normal range of motion.        General: No edema.  Neurological: He is alert and oriented to person, place, and time.  Skin: Skin is warm. He is not diaphoretic.  Psychiatric: He has a normal mood and affect.       ASSESSMENT/PLAN   73 year old pleasant overweight white male with moderate COPD Gold stage B with signs and symptoms of sleep apnea but patient refusing to obtain sleep study at this time Patient with mild COPD exacerbation at this time   #1 shortness of breath and dyspnea on exertion related to multifactorial etiologies including COPD, obesity, and deconditioned state  #2 moderate COPD Gold stage B Continue inhalers as needed Patient with mild COPD exacerbation at this time Will give douneb in office today  #3 COPD exacerbation Start prednisone 20 mg daily for 10 days  start Z-Pak  #4 signs symptoms of sleep apnea Patient refuses any further work-up at this time He refuses to wear any oxygen therapy at this time   #5 obesity -recommend significant weight loss -recommend changing diet  #6 deconditioned state -Recommend increased daily activity and exercise      Patient satisfied with Plan of action and management. All questions answered  Corrin Parker, M.D.  [facility]  Medical Director [place] Director [facility] Cardio-Pulmonary Department

## 2018-09-05 NOTE — Patient Instructions (Signed)
Prednisone 20 mg daily for 10 days  Z pak

## 2018-09-27 ENCOUNTER — Other Ambulatory Visit: Payer: Self-pay | Admitting: Family Medicine

## 2018-09-28 NOTE — Telephone Encounter (Signed)
Rx request for trazodone WAY too soon for refills DENIED

## 2018-09-30 ENCOUNTER — Telehealth: Payer: Self-pay | Admitting: Internal Medicine

## 2018-09-30 MED ORDER — BENZONATATE 200 MG PO CAPS: 200.0000 mg | ORAL_CAPSULE | Freq: Three times a day (TID) | ORAL | 0 refills | Status: AC | PRN

## 2018-09-30 NOTE — Telephone Encounter (Signed)
Call made to patient, made aware of recommendations. Confirmed pharmacy. Tessalon sent to pharmacy. Voiced understanding. Nothing further is needed at this time.

## 2018-09-30 NOTE — Telephone Encounter (Signed)
Call made to patient, patient states he has developed a cough that keeps him up all night. He states it is mostly dry and he has occasional very light mucous. States if he falls asleep he does not stay asleep due to the cough. He is requesting something for his cough.   Dr. Mortimer Fries please advise. Thanks.

## 2018-09-30 NOTE — Telephone Encounter (Signed)
tesselon perles 200 mg tid prn cough

## 2018-10-17 ENCOUNTER — Other Ambulatory Visit: Payer: Self-pay | Admitting: Internal Medicine

## 2018-10-30 ENCOUNTER — Other Ambulatory Visit: Payer: Self-pay | Admitting: Cardiovascular Disease

## 2018-11-13 ENCOUNTER — Other Ambulatory Visit: Payer: Self-pay | Admitting: Family Medicine

## 2018-11-27 ENCOUNTER — Encounter: Payer: Self-pay | Admitting: Nurse Practitioner

## 2018-11-27 ENCOUNTER — Encounter: Payer: Self-pay | Admitting: Family Medicine

## 2018-12-12 ENCOUNTER — Ambulatory Visit: Payer: Federal, State, Local not specified - PPO | Admitting: Family Medicine

## 2018-12-23 ENCOUNTER — Other Ambulatory Visit: Payer: Self-pay | Admitting: Family Medicine

## 2018-12-23 NOTE — Telephone Encounter (Signed)
Please schedule patient for follow up in the next 30 days.  

## 2018-12-23 NOTE — Telephone Encounter (Signed)
Lmessage  To call and schedule appt in 30 days and meds are at [facility]

## 2019-01-28 ENCOUNTER — Other Ambulatory Visit: Payer: Self-pay | Admitting: Family Medicine

## 2019-01-28 NOTE — Telephone Encounter (Signed)
Please schedule patient for follow up in the next 15 days.

## 2019-02-06 ENCOUNTER — Other Ambulatory Visit: Payer: Self-pay | Admitting: Family Medicine

## 2019-02-06 ENCOUNTER — Other Ambulatory Visit: Payer: Self-pay | Admitting: Internal Medicine

## 2019-02-10 ENCOUNTER — Other Ambulatory Visit: Payer: Self-pay

## 2019-02-10 ENCOUNTER — Ambulatory Visit (INDEPENDENT_AMBULATORY_CARE_PROVIDER_SITE_OTHER): Payer: Federal, State, Local not specified - PPO | Admitting: Nurse Practitioner

## 2019-02-10 ENCOUNTER — Encounter: Payer: Self-pay | Admitting: Nurse Practitioner

## 2019-02-10 VITALS — BP 138/89 | HR 77

## 2019-02-10 DIAGNOSIS — J4540 Moderate persistent asthma, uncomplicated: Secondary | ICD-10-CM | POA: Diagnosis not present

## 2019-02-10 DIAGNOSIS — N138 Other obstructive and reflux uropathy: Secondary | ICD-10-CM

## 2019-02-10 DIAGNOSIS — E782 Mixed hyperlipidemia: Secondary | ICD-10-CM

## 2019-02-10 DIAGNOSIS — R040 Epistaxis: Secondary | ICD-10-CM

## 2019-02-10 DIAGNOSIS — F4310 Post-traumatic stress disorder, unspecified: Secondary | ICD-10-CM

## 2019-02-10 DIAGNOSIS — I10 Essential (primary) hypertension: Secondary | ICD-10-CM

## 2019-02-10 DIAGNOSIS — I252 Old myocardial infarction: Secondary | ICD-10-CM

## 2019-02-10 DIAGNOSIS — G4700 Insomnia, unspecified: Secondary | ICD-10-CM

## 2019-02-10 DIAGNOSIS — I208 Other forms of angina pectoris: Secondary | ICD-10-CM | POA: Diagnosis not present

## 2019-02-10 DIAGNOSIS — J449 Chronic obstructive pulmonary disease, unspecified: Secondary | ICD-10-CM | POA: Diagnosis not present

## 2019-02-10 DIAGNOSIS — R0981 Nasal congestion: Secondary | ICD-10-CM

## 2019-02-10 DIAGNOSIS — N401 Enlarged prostate with lower urinary tract symptoms: Secondary | ICD-10-CM

## 2019-02-10 MED ORDER — ATORVASTATIN CALCIUM 10 MG PO TABS: ORAL_TABLET | ORAL | 1 refills | Status: DC

## 2019-02-10 MED ORDER — TRAZODONE HCL 50 MG PO TABS: 25.0000 mg | ORAL_TABLET | Freq: Every evening | ORAL | 1 refills | Status: DC | PRN

## 2019-02-10 MED ORDER — FLUTICASONE PROPIONATE 50 MCG/ACT NA SUSP: 2.0000 | Freq: Every day | NASAL | 6 refills | Status: DC | PRN

## 2019-02-10 MED ORDER — FLUOXETINE HCL 20 MG PO CAPS: ORAL_CAPSULE | ORAL | 1 refills | Status: DC

## 2019-02-10 MED ORDER — OLMESARTAN MEDOXOMIL 20 MG PO TABS: 20.0000 mg | ORAL_TABLET | Freq: Every day | ORAL | 1 refills | Status: DC

## 2019-02-10 NOTE — Progress Notes (Signed)
Virtual Visit via Telephone Note  I connected with Willie Crane on 02/10/19 at  2:00 PM EDT by telephone and verified that I am speaking with the correct person using two identifiers.   Staff discussed the limitations, risks, security and privacy concerns of performing an evaluation and management service by telephone and the availability of in person appointments. Staff also discussed with the patient that there may be a patient responsible charge related to this service. The patient expressed understanding and agreed to proceed.  Patients location: home My location: work office  Other people in meeting: wife- Benjy Kana    HPI States over the past few months has been becoming more frequent in the last 3 weeks nose bleeds. And has pressure behind eyes and the top of his nose. Endorses nasal congestion. States nose bleeds last maybe 15 minutes- packed nose with tissue to get it to resolve. Its typically left nares that bleeds.  Does have allergies- takes OTC allergy medicine as needed- has not needed it since the beginning of June. Does not take any nasal sprays.   Hypertension Patient is on olmesartan 20mg  daily. Takes medications as prescribed with no missed doses a month.  He sees cardiology- Dr. Rockey Situ and follows-up just as needed, has had MI in the past and is taking daily aspirin. Had episodes of chest pain last year that were attributed to GERD.  He is non-compliant with low-salt diet. Eating a lot of chips and salted nuts.  Denies chest pain, blurry vision. Endorses mild headaches intermittently- self-resolve.  BP Readings from Last 3 Encounters:  02/10/19 138/89  09/05/18 (!) 146/82  07/22/18 (!) 156/85   Hyperlipidemia Patient rx atorvastatin 10mg  nightly. Takes medications as prescribed with a few missed doses a month.  Diet: rarely eats fried foods, eats vegetables daily- eats a lot of broccoli. Does not eat red meats often. Denies myalgias CT chest 09/2016- No  coronary calcification noted, no aortic atherosclerosis Lab Results  Component Value Date   CHOL 157 06/24/2018   HDL 53 06/24/2018   LDLCALC 74 06/24/2018   TRIG 152 (H) 06/24/2018   CHOLHDL 3.0 06/24/2018   GERD Takes Prilosec with resolution of symptoms- has no taken it in awhile but has not had acid-reflux.  If symptom return is to contact Dr. Lynnell Jude office for upper endoscopy.   COPD & Asthma Follows up with pulmonology- Dr. Mortimer Fries Last exacerbation in 08/2018- resolved with z-pack and prednisone.  Takes Symbicort 2 puffs BID and albuterol PRN Former smoker with 20 ppd.   BPH Follows up with [facility] urology He is taking finasteride 5mg   Helps with urinary flow, has to go to the bathroom less often.   Insomnia Patient is taking trazodone 0.5-1 tablets to help with sleep, states he takes this nightly. 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   PTSD Patient is taking prozac for PTSD related to [place] war- was replaying situations, no longer doing that on the prozac.   PHQ2/9: Depression screen [facility] 2/9 02/10/2019 07/15/2018 06/24/2018 01/13/2018 11/16/2016  Decreased Interest 0 0 0 0 0  Down, Depressed, Hopeless 0 0 0 1 0  PHQ - 2 Score 0 0 0 1 0  Altered sleeping 0 0 0 3 -  Tired, decreased energy 0 0 0 1 -  Change in appetite 0 0 0 1 -  Feeling bad or failure about yourself  0 0 0 0 -  Trouble concentrating 0 0  0 1 -  Moving slowly or fidgety/restless 0 0 0 0 -  Suicidal thoughts 0 0 0 0 -  PHQ-9 Score 0 0 0 7 -  Difficult doing work/chores Not difficult at all Not difficult at all Not difficult at all Not difficult at all -     PHQ reviewed. Negative  Patient Active Problem List   Diagnosis Date Noted  . PTSD (post-traumatic stress disorder) 02/10/2019  . Morbid obesity ([place]) 06/30/2018  . Coronary artery disease 06/30/2018  . Benign essential HTN 05/19/2018  . Stable angina pectoris ([place]) 05/19/2018  . Stage 2  moderate COPD by GOLD classification ([place]) 02/12/2017  . Hypomagnesemia 11/16/2016  . Hypokalemia 11/16/2016  . History of acute myocardial infarction 09/27/2016  . Prolonged Q-T interval on ECG 09/27/2016  . Moderate persistent asthma 09/28/2015  . 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 Hydrocele, bilateral 07/12/2015  . Hyperlipidemia     Past Medical History:  Diagnosis Date  . Anxiety   . Asthma   . Benign neoplasm of transverse colon   . Benign paroxysmal positional vertigo   . BPH (benign prostatic hypertrophy)   . Complication of anesthesia   . COPD (chronic obstructive pulmonary disease) ([place])   . Coronary artery disease    history of heart attack  . Degeneration of lumbar or lumbosacral intervertebral disc   . History of kidney stones   . Hyperlipidemia   . Hypertension   . MI (myocardial infarction) ([place])    1980s  . Nephrolithiasis 09/28/2016  . Obesity, unspecified   . PONV (postoperative nausea and vomiting)   . PTSD (post-traumatic stress disorder)   . Renal disorder    kidney stones  . Thoracic or lumbosacral neuritis or radiculitis, unspecified   . Unspecified congenital anomaly of the integument   . Unspecified gastritis and gastroduodenitis without mention of hemorrhage     Past Surgical History:  Procedure Laterality Date  . BACK SURGERY    . CARDIAC CATHETERIZATION  03/2009   [facility]: No significant coronary artery disease  . CARDIAC CATHETERIZATION  01/2014   [facility]  . CARPAL TUNNEL RELEASE Right   . CHOLECYSTECTOMY    . COLONOSCOPY    . COLONOSCOPY WITH PROPOFOL N/A 10/31/2015   Procedure: COLONOSCOPY WITH PROPOFOL;  Surgeon: Lucilla Lame, MD;  Location: [facility];  Service: Endoscopy;  Laterality: N/A;  . CYST EXCISION    . CYSTOSCOPY W/ URETERAL STENT PLACEMENT Left 10/26/2016   Procedure: CYSTOSCOPY WITH STENT REPLACEMENT;  Surgeon: Nickie Retort, MD;  Location: [facility];  Service: Urology;  Laterality: Left;   . CYSTOSCOPY WITH STENT PLACEMENT Left 10/05/2016   Procedure: CYSTOSCOPY WITH STENT PLACEMENT;  Surgeon: Nickie Retort, MD;  Location: [facility];  Service: Urology;  Laterality: Left;  . HERNIA REPAIR    . INGUINAL HERNIA REPAIR Right   . INGUINAL HERNIA REPAIR Left 01/26/2016   Procedure: HERNIA REPAIR INGUINAL ADULT;  Surgeon: Christene Lye, MD;  Location: [facility];  Service: General;  Laterality: Left;  . KNEE ARTHROSCOPY    . LUMBAR LAMINECTOMY    . POLYPECTOMY  10/31/2015   Procedure: POLYPECTOMY;  Surgeon: Lucilla Lame, MD;  Location: [facility];  Service: Endoscopy;;  . URETEROSCOPY Left 10/05/2016   Procedure: URETEROSCOPY;  Surgeon: Nickie Retort, MD;  Location: [facility];  Service: Urology;  Laterality: Left;  . URETEROSCOPY WITH HOLMIUM LASER LITHOTRIPSY Left 10/26/2016   Procedure: URETEROSCOPY WITH HOLMIUM LASER LITHOTRIPSY;  Surgeon: Aaron Edelman  Harolyn Rutherford, MD;  Location: [facility];  Service: Urology;  Laterality: Left;  Marland Kitchen VASECTOMY      Social History   Tobacco Use  . Smoking status: Former Smoker    Packs/day: 1.00    Years: 20.00    Pack years: 20.00    Types: Cigarettes    Quit date: 01/19/1979    Years since quitting: 40.0  . Smokeless tobacco: Former Systems developer    Types: Chew  Substance Use Topics  . Alcohol use: Yes    Alcohol/week: 0.0 standard drinks    Comment: Occasional Wine     Current Outpatient Medications:  .  acetaminophen (TYLENOL) 325 MG tablet, Take 2 tablets (650 mg total) by mouth every 6 (six) hours as needed for mild pain or fever., Disp: , Rfl:  .  albuterol (PROVENTIL HFA;VENTOLIN HFA) 108 (90 Base) MCG/ACT inhaler, INHALE 2 PUFFS INTO THE LUNGS EVERY 4 (FOUR) HOURS AS NEEDED FOR WHEEZING OR SHORTNESS OF BREATH., Disp: 25.5 Inhaler, Rfl: 10 .  aspirin EC 81 MG tablet, Take 1 tablet (81 mg total) by mouth daily., Disp: , Rfl:  .  Bioflavonoid Products (VITAMIN C) CHEW, Take 240 mgs once daily, Disp: , Rfl:  .  finasteride  (PROSCAR) 5 MG tablet, TAKE 1 TABLET BY MOUTH EVERY DAY, Disp: 90 tablet, Rfl: 3 .  Multiple Vitamins-Minerals (MULTI ADULT GUMMIES) CHEW, Chew 1 tablet by mouth daily., Disp: , Rfl:  .  omeprazole (PRILOSEC) 20 MG capsule, TAKE 1 CAPSULE (20 MG TOTAL) BY MOUTH 2 (TWO) TIMES DAILY BEFORE A MEAL., Disp: 180 capsule, Rfl: 0 .  SYMBICORT 160-4.5 MCG/ACT inhaler, USE 2 INHALATIONS ORALLY   TWICE DAILY, Disp: 10.2 g, Rfl: 0 .  traZODone (DESYREL) 50 MG tablet, Take 0.5-1 tablets (25-50 mg total) by mouth at bedtime as needed for sleep., Disp: 90 tablet, Rfl: 1 .  triamcinolone ointment (KENALOG) 0.5 %, Apply 1 application topically 2 (two) times daily., Disp: 30 g, Rfl: 2 .  atorvastatin (LIPITOR) 10 MG tablet, TAKE 1 TABLET BY MOUTH EVERYDAY AT BEDTIME, Disp: 90 tablet, Rfl: 1 .  FLUoxetine (PROZAC) 20 MG capsule, TAKE 1 CAPSULE BY MOUTH EVERY DAY, Disp: 90 capsule, Rfl: 1 .  Ketotifen Fumarate (ALLERGY EYE DROPS OP), Apply 1 drop to eye daily as needed (allergies)., Disp: , Rfl:  .  olmesartan (BENICAR) 20 MG tablet, Take 1 tablet (20 mg total) by mouth daily., Disp: 90 tablet, Rfl: 1  Allergies  Allergen Reactions  . Other Swelling    Lips swell - Potatoes, Kiwi, carrots, celery (raw)  general anesthesia - nausea     ROS   No other specific complaints in a complete review of systems (except as listed in HPI above).  Objective  Vitals:   02/10/19 1243 02/10/19 1319  BP: (!) 153/94 138/89  Pulse: 81 77     There is no height or weight on file to calculate BMI.  Patient is alert, able to speak in full sentences without difficulty.   Assessment & Plan  1. Essential hypertension Stable  - olmesartan (BENICAR) 20 MG tablet; Take 1 tablet (20 mg total) by mouth daily.  Dispense: 90 tablet; Refill: 1  2. Stable angina pectoris ([facility]) Asymptomatic, not on BB, follow-up with cards as needed.   3. Stage 2 moderate COPD by GOLD classification ([place]) Stable, follow up with pulmonology    4. Moderate persistent asthma without complication Stable  5. BPH with obstruction/lower urinary tract symptoms Stable follow up with urology  6. Mixed hyperlipidemia stable - atorvastatin (LIPITOR) 10 MG tablet; TAKE 1 TABLET BY MOUTH EVERYDAY AT BEDTIME  Dispense: 90 tablet; Refill: 1  7. History of acute myocardial infarction Continue ASA at this time   8. Morbid obesity ([place]) Discussed diet, plans to follow DASH  9. Insomnia, unspecified type stable - traZODone (DESYREL) 50 MG tablet; Take 0.5-1 tablets (25-50 mg total) by mouth at bedtime as needed for sleep.  Dispense: 90 tablet; Refill: 1  10. PTSD (post-traumatic stress disorder) stable - FLUoxetine (PROZAC) 20 MG capsule; TAKE 1 CAPSULE BY MOUTH EVERY DAY  Dispense: 90 capsule; Refill: 1  11. Epistaxis BP controlled- discussed DASH and monitoring routinely to see if this is a cause. Due to MI history will continue ASA at this time.  Use saline to keep nasal passages moist for 4 days Then add on flonase Discussed proper epistaxis care If continuing in 2 weeks and BP elevated- adjust meds, if BP normal and continuing refer to ENT   Follow up in 2 weeks for nose bleed and bp I discussed the assessment and treatment plan with the patient. The patient was provided an opportunity to ask questions and all were answered. The patient agreed with the plan and demonstrated an understanding of the instructions.   The patient was advised to call back or seek an in-person evaluation if the symptoms worsen or if the condition fails to improve as anticipated.  I provided 28 minutes of non-face-to-face time during this encounter.   Fredderick Severance, NP

## 2019-02-13 ENCOUNTER — Encounter: Payer: Self-pay | Admitting: Internal Medicine

## 2019-02-13 ENCOUNTER — Ambulatory Visit (INDEPENDENT_AMBULATORY_CARE_PROVIDER_SITE_OTHER): Payer: Federal, State, Local not specified - PPO | Admitting: Internal Medicine

## 2019-02-13 DIAGNOSIS — J449 Chronic obstructive pulmonary disease, unspecified: Secondary | ICD-10-CM

## 2019-02-13 DIAGNOSIS — J441 Chronic obstructive pulmonary disease with (acute) exacerbation: Secondary | ICD-10-CM | POA: Diagnosis not present

## 2019-02-13 MED ORDER — SYMBICORT 160-4.5 MCG/ACT IN AERO: INHALATION_SPRAY | RESPIRATORY_TRACT | 0 refills | Status: DC

## 2019-02-13 MED ORDER — AZITHROMYCIN 250 MG PO TABS: ORAL_TABLET | ORAL | 0 refills | Status: DC

## 2019-02-13 MED ORDER — PREDNISONE 20 MG PO TABS: 20.0000 mg | ORAL_TABLET | Freq: Every day | ORAL | 0 refills | Status: DC

## 2019-02-13 MED ORDER — ALBUTEROL SULFATE HFA 108 (90 BASE) MCG/ACT IN AERS: 2.0000 | INHALATION_SPRAY | RESPIRATORY_TRACT | 10 refills | Status: DC | PRN

## 2019-02-13 NOTE — Patient Instructions (Addendum)
Continue inhalers as prescribed  Prednisone 20 mg daily for 10 days Z pak

## 2019-02-13 NOTE — Progress Notes (Signed)
[facility] Consultation      Date: 02/13/2019,   MRN# [id number] Willie Crane [birth year]   I connected with the patient by video/telephone enabled telemedicine visit and verified that I am speaking with the correct person using two identifiers.    I discussed the limitations, risks, security and privacy concerns of performing an evaluation and management service by telemedicine and the availability of in-person appointments. I also discussed with the patient that there may be a patient responsible charge related to this service. The patient expressed understanding and agreed to proceed.  PATIENT AGREES AND CONFIRMS -YES   Other persons participating in the visit and their role in the encounter: Patient, nursing   Patient's location: Home Provider's location: Clinic   I discussed the limitations, risks, security and privacy concerns of performing an evaluation and management service by telephone and the availability of in person appointments. I also discussed with the patient that there may be a patient responsible charge related to this service. The patient expressed understanding and agreed to proceed.  This visit type was conducted due to national recommendations for restrictions regarding the COVID-19 Pandemic (e.g. social distancing).  This format is felt to be most appropriate for this patient at this time.  All issues noted in this document were discussed and addressed.       PREVIOUS HISTORY 73 yo white male with dx of ASTHMA in his 30's-he is [place] Vet Patient has been on inhaled steroids for many years. Patient has chronic fatigue and SOB and DOE for very long time Quit smoking 40 years ago Smoked 1 ppd for 20 years Worked as Gaffer, retired 5 years now Has gained 60 pounds in 4 years  [place] shows ratio 68% Fev1 1.8L 64% fef25/75 50% Moderate Obstructive and Restrictive lung disease   CHIEF COMPLAINT:   Follow up COPD Follow  up Moderate COPD  HISTORY OF PRESENT ILLNESS  +productive cough +chest congestion +wheezing Increased WOB and SOB No fevers at this time  +COPD exacerbation  Patient refuses to obtain sleep study at this time   Current Medication:  Current Outpatient Medications:  .  acetaminophen (TYLENOL) 325 MG tablet, Take 2 tablets (650 mg total) by mouth every 6 (six) hours as needed for mild pain or fever., Disp: , Rfl:  .  albuterol (PROVENTIL HFA;VENTOLIN HFA) 108 (90 Base) MCG/ACT inhaler, INHALE 2 PUFFS INTO THE LUNGS EVERY 4 (FOUR) HOURS AS NEEDED FOR WHEEZING OR SHORTNESS OF BREATH., Disp: 25.5 Inhaler, Rfl: 10 .  aspirin EC 81 MG tablet, Take 1 tablet (81 mg total) by mouth daily., Disp: , Rfl:  .  atorvastatin (LIPITOR) 10 MG tablet, TAKE 1 TABLET BY MOUTH EVERYDAY AT BEDTIME, Disp: 90 tablet, Rfl: 1 .  Bioflavonoid Products (VITAMIN C) CHEW, Take 240 mgs once daily, Disp: , Rfl:  .  finasteride (PROSCAR) 5 MG tablet, TAKE 1 TABLET BY MOUTH EVERY DAY, Disp: 90 tablet, Rfl: 3 .  FLUoxetine (PROZAC) 20 MG capsule, TAKE 1 CAPSULE BY MOUTH EVERY DAY, Disp: 90 capsule, Rfl: 1 .  fluticasone (FLONASE) 50 MCG/ACT nasal spray, Place 2 sprays into both nostrils daily as needed for allergies or rhinitis., Disp: 16 g, Rfl: 6 .  Ketotifen Fumarate (ALLERGY EYE DROPS OP), Apply 1 drop to eye daily as needed (allergies)., Disp: , Rfl:  .  Multiple Vitamins-Minerals (MULTI ADULT GUMMIES) CHEW, Chew 1 tablet by mouth daily., Disp: , Rfl:  .  olmesartan (BENICAR) 20 MG tablet, Take 1  tablet (20 mg total) by mouth daily., Disp: 90 tablet, Rfl: 1 .  omeprazole (PRILOSEC) 20 MG capsule, TAKE 1 CAPSULE (20 MG TOTAL) BY MOUTH 2 (TWO) TIMES DAILY BEFORE A MEAL., Disp: 180 capsule, Rfl: 0 .  SYMBICORT 160-4.5 MCG/ACT inhaler, USE 2 INHALATIONS ORALLY   TWICE DAILY, Disp: 10.2 g, Rfl: 0 .  traZODone (DESYREL) 50 MG tablet, Take 0.5-1 tablets (25-50 mg total) by mouth at bedtime as needed for sleep., Disp: 90  tablet, Rfl: 1 .  triamcinolone ointment (KENALOG) 0.5 %, Apply 1 application topically 2 (two) times daily., Disp: 30 g, Rfl: 2    ALLERGIES   Other    REVIEW OF SYSTEMS   Review of Systems  Constitutional: Positive for malaise/fatigue. Negative for chills, diaphoresis, fever and weight loss.  HENT: Negative for congestion and hearing loss.   Eyes: Negative for blurred vision and double vision.  Respiratory: Positive for cough, shortness of breath and wheezing. Negative for hemoptysis and sputum production.   Cardiovascular: Negative for chest pain, palpitations, orthopnea and leg swelling.  Gastrointestinal: Negative for abdominal pain, heartburn, nausea and vomiting.  Skin: Negative for rash.  Neurological: Negative for weakness and headaches.  All other systems reviewed and are negative.    ASSESSMENT/PLAN   73 year old pleasant obese white male with moderate COPD Gold stage D with signs and symptoms of sleep apnea but patient refusing to obtain sleep study at this time in the setting of COPD exacerbation  Shortness of breath and dyspnea exertion related to multifactorial etiologies including COPD obesity and deconditioned state Patient would benefit from pulmonary rehab  COPD exacerbation Prednisone 20 mg daily for 10 days Z-Pak  COPD Continue Symbicort as prescribed Albuterol as needed   Signs symptoms of sleep apnea Patient refuses any further work-up at this time Refuses to wear any oxygen therapy at this time   Obesity -recommend significant weight loss -recommend changing diet  Deconditioned state -Recommend increased daily activity and exercise      COVID-19 EDUCATION: The signs and symptoms of COVID-19 were discussed with the patient and how to seek care for testing.  The importance of social distancing was discussed today. Hand Washing Techniques and avoid touching face was advised.  MEDICATION ADJUSTMENTS/LABS AND TESTS ORDERED: Continue  inhalers as prescribed Prednisone 20 mg daily for 10 days Z pak   CURRENT MEDICATIONS REVIEWED AT LENGTH WITH PATIENT TODAY   Patient satisfied with Plan of action and management. All questions answered  Follow up in 6 months Total time spent 27 minutes   Corrin Parker, M.D.  [facility]  Medical Director [place] Director [facility] Cardio-Pulmonary Department

## 2019-02-21 ENCOUNTER — Other Ambulatory Visit: Payer: Self-pay | Admitting: Family Medicine

## 2019-03-25 ENCOUNTER — Other Ambulatory Visit: Payer: Self-pay | Admitting: Internal Medicine

## 2019-03-25 MED ORDER — BUDESONIDE-FORMOTEROL FUMARATE 160-4.5 MCG/ACT IN AERO: 2.0000 | INHALATION_SPRAY | Freq: Two times a day (BID) | RESPIRATORY_TRACT | 3 refills | Status: DC

## 2019-03-25 NOTE — Telephone Encounter (Signed)
Rx for generic symbicort 160 has been sent to preferred pharmacy.  Pt is aware and voiced his understanding. Nothing further is needed.

## 2019-03-26 MED ORDER — BUDESONIDE-FORMOTEROL FUMARATE 160-4.5 MCG/ACT IN AERO: INHALATION_SPRAY | RESPIRATORY_TRACT | 3 refills | Status: DC

## 2019-03-26 NOTE — Addendum Note (Signed)
Addended by: Maryanna Shape A on: 03/26/2019 08:07 AM   Modules accepted: Orders

## 2019-07-07 ENCOUNTER — Other Ambulatory Visit: Payer: Self-pay | Admitting: Family Medicine

## 2019-07-07 DIAGNOSIS — N138 Other obstructive and reflux uropathy: Secondary | ICD-10-CM

## 2019-07-14 ENCOUNTER — Other Ambulatory Visit: Payer: Federal, State, Local not specified - PPO

## 2019-07-21 ENCOUNTER — Ambulatory Visit: Payer: Federal, State, Local not specified - PPO | Admitting: Urology

## 2019-07-25 ENCOUNTER — Other Ambulatory Visit: Payer: Self-pay | Admitting: Urology

## 2019-07-26 ENCOUNTER — Other Ambulatory Visit: Payer: Self-pay | Admitting: Internal Medicine

## 2019-08-10 ENCOUNTER — Telehealth: Payer: Self-pay | Admitting: Internal Medicine

## 2019-08-11 ENCOUNTER — Telehealth: Payer: Self-pay | Admitting: Internal Medicine

## 2019-08-11 MED ORDER — BENZONATATE 200 MG PO CAPS: 200.0000 mg | ORAL_CAPSULE | Freq: Three times a day (TID) | ORAL | 0 refills | Status: DC | PRN

## 2019-08-11 NOTE — Telephone Encounter (Signed)
It appears that [facility] contacted pt.

## 2019-08-11 NOTE — Telephone Encounter (Signed)
Rx for Tessalon 200mg  has been sent to preferred pharmacy. Pt is aware and voiced his understanding.  Nothing further is needed.

## 2019-08-11 NOTE — Telephone Encounter (Signed)
Please refill.

## 2019-08-11 NOTE — Telephone Encounter (Signed)
Called patient in regards to a recall for his 6 month f/u. Pt wants to wait at this time due to covid, I let him know we offer phone visits as well BL

## 2019-08-11 NOTE — Telephone Encounter (Signed)
DK, please advise on refill. Thanks

## 2019-08-12 ENCOUNTER — Telehealth: Payer: Self-pay

## 2019-08-12 DIAGNOSIS — E782 Mixed hyperlipidemia: Secondary | ICD-10-CM

## 2019-08-12 DIAGNOSIS — I10 Essential (primary) hypertension: Secondary | ICD-10-CM

## 2019-08-12 MED ORDER — ATORVASTATIN CALCIUM 10 MG PO TABS: ORAL_TABLET | ORAL | 0 refills | Status: DC

## 2019-08-12 MED ORDER — OLMESARTAN MEDOXOMIL 20 MG PO TABS: 20.0000 mg | ORAL_TABLET | Freq: Every day | ORAL | 0 refills | Status: DC

## 2019-08-13 NOTE — Telephone Encounter (Signed)
Pt is scheduled °

## 2019-08-19 ENCOUNTER — Encounter: Payer: Self-pay | Admitting: Family Medicine

## 2019-08-19 ENCOUNTER — Ambulatory Visit (INDEPENDENT_AMBULATORY_CARE_PROVIDER_SITE_OTHER): Payer: Federal, State, Local not specified - PPO | Admitting: Family Medicine

## 2019-08-19 ENCOUNTER — Other Ambulatory Visit: Payer: Self-pay

## 2019-08-19 VITALS — BP 151/103 | HR 74 | Ht 67.0 in | Wt 240.0 lb

## 2019-08-19 DIAGNOSIS — F4310 Post-traumatic stress disorder, unspecified: Secondary | ICD-10-CM | POA: Diagnosis not present

## 2019-08-19 DIAGNOSIS — E782 Mixed hyperlipidemia: Secondary | ICD-10-CM

## 2019-08-19 DIAGNOSIS — Z5181 Encounter for therapeutic drug level monitoring: Secondary | ICD-10-CM

## 2019-08-19 DIAGNOSIS — J449 Chronic obstructive pulmonary disease, unspecified: Secondary | ICD-10-CM

## 2019-08-19 DIAGNOSIS — G4700 Insomnia, unspecified: Secondary | ICD-10-CM | POA: Diagnosis not present

## 2019-08-19 DIAGNOSIS — I10 Essential (primary) hypertension: Secondary | ICD-10-CM | POA: Diagnosis not present

## 2019-08-19 MED ORDER — ALBUTEROL SULFATE HFA 108 (90 BASE) MCG/ACT IN AERS: 2.0000 | INHALATION_SPRAY | RESPIRATORY_TRACT | 11 refills | Status: DC | PRN

## 2019-08-19 MED ORDER — FLUOXETINE HCL 20 MG PO CAPS: ORAL_CAPSULE | ORAL | 1 refills | Status: DC

## 2019-08-19 MED ORDER — ATORVASTATIN CALCIUM 10 MG PO TABS: ORAL_TABLET | ORAL | 3 refills | Status: DC

## 2019-08-19 MED ORDER — TRAZODONE HCL 50 MG PO TABS: 25.0000 mg | ORAL_TABLET | Freq: Every evening | ORAL | 1 refills | Status: DC | PRN

## 2019-08-19 MED ORDER — OLMESARTAN MEDOXOMIL 40 MG PO TABS: 20.0000 mg | ORAL_TABLET | Freq: Every day | ORAL | 3 refills | Status: DC

## 2019-08-19 MED ORDER — BUDESONIDE-FORMOTEROL FUMARATE 160-4.5 MCG/ACT IN AERO: INHALATION_SPRAY | RESPIRATORY_TRACT | 3 refills | Status: DC

## 2019-08-19 NOTE — Progress Notes (Signed)
Name: Willie Crane   MRN: [id number]    DOB: 01-22-1946   Date:08/19/2019       Progress Note  Subjective:    I connected with  Willie Crane  on 08/19/19 at 10:00 AM EST by a video enabled telemedicine application and verified that I am speaking with the correct person using two identifiers.  I discussed the limitations of evaluation and management by telemedicine and the availability of in person appointments. The patient expressed understanding and agreed to proceed. Staff also discussed with the patient that there may be a patient responsible charge related to this service. Patient Location: home Provider Location: [facility] clinic Additional Individuals present: none  Chief Complaint  Patient presents with  . Follow-up  . Medication Refill  . Hypertension  . Hyperlipidemia  . Gastroesophageal Reflux  . Post-Traumatic Stress Disorder    BEJAN JACOBS is a 74 y.o. male, presents for virtual visit for routine follow up on the conditions listed above.  Hypertension:  Currently managed on benicar Pt reports good med compliance and denies any SE.  No lightheadedness, hypotension, syncope. Blood pressure today is elevated today BP Readings from Last 3 Encounters:  08/19/19 (!) 151/103  02/10/19 138/89  09/05/18 (!) 146/82  Pt denies CP, SOB, exertional sx, LE edema, palpitation, Ha's, visual disturbances Dietary efforts for BP?  Generally healthy, low fat, low salt Has CAD, MI and stable angina - sees cardiology,  Also hx of prolonged QTc  Hyperlipidemia: Current Medication Regimen:  Prescribed lipitor 10 mg but pt has not taken, he was concerned about SE, previous lengthy discussions with PCP and Cardiology Last Lipids: Lab Results  Component Value Date   CHOL 157 06/24/2018   HDL 53 06/24/2018   LDLCALC 74 06/24/2018   TRIG 152 (H) 06/24/2018   CHOLHDL 3.0 06/24/2018  - Current Diet:  See above   - Denies: Chest pain, shortness of breath, myalgias. - Documented  aortic atherosclerosis? Yes - Risk factors for atherosclerosis: arteriosclerotic heart disease, hypercholesterolemia and hypertension  Obesity:  Some weight loss 14 lbs over the past year  Wt Readings from Last 5 Encounters:  08/19/19 240 lb (108.9 kg)  09/05/18 254 lb 6.4 oz (115.4 kg)  07/22/18 243 lb 6.4 oz (110.4 kg)  07/17/18 244 lb 1.6 oz (110.7 kg)  07/15/18 243 lb 1.6 oz (110.3 kg)   BMI Readings from Last 5 Encounters:  08/19/19 37.59 kg/m  09/05/18 41.06 kg/m  07/22/18 39.29 kg/m  07/17/18 39.40 kg/m  07/15/18 39.24 kg/m   Hx of PTSD Depression screen [facility] 2/9 08/19/2019 02/10/2019 07/15/2018  Decreased Interest 0 0 0  Down, Depressed, Hopeless 0 0 0  PHQ - 2 Score 0 0 0  Altered sleeping 1 0 0  Tired, decreased energy 1 0 0  Change in appetite 0 0 0  Feeling bad or failure about yourself  0 0 0  Trouble concentrating 0 0 0  Moving slowly or fidgety/restless 0 0 0  Suicidal thoughts 0 0 0  PHQ-9 Score 2 0 0  Difficult doing work/chores Not difficult at all Not difficult at all Not difficult at all   COPD - using symbicort daily and using albuterol rescue sometimes 1-2x a day  Nose bleeds which is new, last one was a couple weeks ago, had a few nose bleeds in the past 6 months  Patient Active Problem List   Diagnosis Date Noted  . PTSD (post-traumatic stress disorder) 02/10/2019  . Morbid obesity ([place])  06/30/2018  . Coronary artery disease 06/30/2018  . Benign essential HTN 05/19/2018  . Stable angina pectoris ([place]) 05/19/2018  . Stage 2 moderate COPD by GOLD classification ([place]) 02/12/2017  . Hypomagnesemia 11/16/2016  . Hypokalemia 11/16/2016  . History of acute myocardial infarction 09/27/2016  . Prolonged Q-T interval on ECG 09/27/2016  . Moderate persistent asthma 09/28/2015  . 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 Hydrocele, bilateral 07/12/2015  . Hyperlipidemia     Current Outpatient Medications:  .  acetaminophen  (TYLENOL) 325 MG tablet, Take 2 tablets (650 mg total) by mouth every 6 (six) hours as needed for mild pain or fever., Disp: , Rfl:  .  albuterol (VENTOLIN HFA) 108 (90 Base) MCG/ACT inhaler, Inhale 2 puffs into the lungs every 4 (four) hours as needed for wheezing or shortness of breath., Disp: 1 g, Rfl: 10 .  aspirin EC 81 MG tablet, Take 1 tablet (81 mg total) by mouth daily., Disp: , Rfl:  .  atorvastatin (LIPITOR) 10 MG tablet, TAKE 1 TABLET BY MOUTH EVERYDAY AT BEDTIME, Disp: 30 tablet, Rfl: 0 .  benzonatate (TESSALON) 200 MG capsule, Take 1 capsule (200 mg total) by mouth 3 (three) times daily as needed for cough., Disp: 30 capsule, Rfl: 0 .  Bioflavonoid Products (VITAMIN C) CHEW, Take 240 mgs once daily, Disp: , Rfl:  .  budesonide-formoterol (SYMBICORT) 160-4.5 MCG/ACT inhaler, 2 puffs BID, Disp: 3 Inhaler, Rfl: 3 .  finasteride (PROSCAR) 5 MG tablet, TAKE 1 TABLET BY MOUTH EVERY DAY, Disp: 90 tablet, Rfl: 3 .  FLUoxetine (PROZAC) 20 MG capsule, TAKE 1 CAPSULE BY MOUTH EVERY DAY, Disp: 90 capsule, Rfl: 1 .  Multiple Vitamins-Minerals (MULTI ADULT GUMMIES) CHEW, Chew 1 tablet by mouth daily., Disp: , Rfl:  .  olmesartan (BENICAR) 20 MG tablet, Take 1 tablet (20 mg total) by mouth daily., Disp: 30 tablet, Rfl: 0 .  SYMBICORT 160-4.5 MCG/ACT inhaler, USE 2 INHALATIONS ORALLY   TWICE DAILY, Disp: 10.2 g, Rfl: 0 .  traZODone (DESYREL) 50 MG tablet, Take 0.5-1 tablets (25-50 mg total) by mouth at bedtime as needed for sleep., Disp: 90 tablet, Rfl: 1 .  triamcinolone ointment (KENALOG) 0.5 %, APPLY TO AFFECTED AREA TWICE A DAY, Disp: 30 g, Rfl: 2 Allergies  Allergen Reactions  . Other Swelling    Lips swell - Potatoes, Kiwi, carrots, celery (raw)  general anesthesia - nausea     Past Surgical History:  Procedure Laterality Date  . BACK SURGERY    . CARDIAC CATHETERIZATION  03/2009   [facility]: No significant coronary artery disease  . CARDIAC CATHETERIZATION  01/2014   [facility]  . CARPAL TUNNEL  RELEASE Right   . CHOLECYSTECTOMY    . COLONOSCOPY    . COLONOSCOPY WITH PROPOFOL N/A 10/31/2015   Procedure: COLONOSCOPY WITH PROPOFOL;  Surgeon: Lucilla Lame, MD;  Location: [facility];  Service: Endoscopy;  Laterality: N/A;  . CYST EXCISION    . CYSTOSCOPY W/ URETERAL STENT PLACEMENT Left 10/26/2016   Procedure: CYSTOSCOPY WITH STENT REPLACEMENT;  Surgeon: Nickie Retort, MD;  Location: [facility];  Service: Urology;  Laterality: Left;  . CYSTOSCOPY WITH STENT PLACEMENT Left 10/05/2016   Procedure: CYSTOSCOPY WITH STENT PLACEMENT;  Surgeon: Nickie Retort, MD;  Location: [facility];  Service: Urology;  Laterality: Left;  . HERNIA REPAIR    . INGUINAL HERNIA REPAIR Right   . INGUINAL HERNIA REPAIR Left 01/26/2016   Procedure: HERNIA REPAIR INGUINAL ADULT;  Surgeon: Daryel Gerald  Robinette Haines, MD;  Location: [facility];  Service: General;  Laterality: Left;  . KNEE ARTHROSCOPY    . LUMBAR LAMINECTOMY    . POLYPECTOMY  10/31/2015   Procedure: POLYPECTOMY;  Surgeon: Lucilla Lame, MD;  Location: [facility];  Service: Endoscopy;;  . URETEROSCOPY Left 10/05/2016   Procedure: URETEROSCOPY;  Surgeon: Nickie Retort, MD;  Location: [facility];  Service: Urology;  Laterality: Left;  . URETEROSCOPY WITH HOLMIUM LASER LITHOTRIPSY Left 10/26/2016   Procedure: URETEROSCOPY WITH HOLMIUM LASER LITHOTRIPSY;  Surgeon: Nickie Retort, MD;  Location: [facility];  Service: Urology;  Laterality: Left;  Marland Kitchen VASECTOMY     Family History  Problem Relation Age of Onset  . Kidney failure Mother   . Cancer Mother   . Cancer Father        lung  . Prostate cancer Neg Hx    Social History   Socioeconomic History  . Marital status: Married    Spouse name: Not on file  . Number of children: Not on file  . Years of education: Not on file  . Highest education level: Not on file  Occupational History  . Not on file  Tobacco Use  . Smoking status: Former Smoker    Packs/day: 1.00    Years: 20.00     Pack years: 20.00    Types: Cigarettes    Quit date: 01/19/1979    Years since quitting: 40.6  . Smokeless tobacco: Former Systems developer    Types: Chew  Substance and Sexual Activity  . Alcohol use: Yes    Alcohol/week: 0.0 standard drinks    Comment: Occasional Wine  . Drug use: No  . Sexual activity: Yes  Other Topics Concern  . Not on file  Social History Narrative  . Not on file   Social Determinants of Health   Financial Resource Strain:   . Difficulty of Paying Living Expenses: Not on file  Food Insecurity:   . Worried About Charity fundraiser in the Last Year: Not on file  . Ran Out of Food in the Last Year: Not on file  Transportation Needs:   . Lack of Transportation (Medical): Not on file  . Lack of Transportation (Non-Medical): Not on file  Physical Activity:   . Days of Exercise per Week: Not on file  . Minutes of Exercise per Session: Not on file  Stress:   . Feeling of Stress : Not on file  Social Connections:   . Frequency of Communication with Friends and Family: Not on file  . Frequency of Social Gatherings with Friends and Family: Not on file  . Attends Religious Services: Not on file  . Active Member of Clubs or Organizations: Not on file  . Attends Archivist Meetings: Not on file  . Marital Status: Not on file  Intimate Partner Violence:   . Fear of Current or Ex-Partner: Not on file  . Emotionally Abused: Not on file  . Physically Abused: Not on file  . Sexually Abused: Not on file    Chart Review Today: I personally reviewed active problem list, medication list, allergies, family history, social history, health maintenance, notes from last encounter, lab results, imaging with the patient/caregiver today. Reviewed care everywhere cardiology visits and testing in 2019/2020  Review of Systems  Constitutional: Negative.   HENT: Negative.   Eyes: Negative.   Respiratory: Negative.   Cardiovascular: Negative.   Gastrointestinal: Negative.     Endocrine: Negative.   Genitourinary: Negative.  Musculoskeletal: Negative.   Skin: Negative.   Allergic/Immunologic: Negative.   Neurological: Negative.   Hematological: Negative.   Psychiatric/Behavioral: Negative.   All other systems reviewed and are negative.     Objective:    Virtual encounter, vitals limited, only able to obtain the following Today's Vitals   08/19/19 0854  BP: (!) 151/103  Pulse: 74  Weight: 240 lb (108.9 kg)  Height: 5\' 7"  (1.702 m)   Body mass index is 37.59 kg/m. Nursing Note and Vital Signs reviewed.  Physical Exam Vitals and nursing note reviewed.  Pulmonary:     Comments: Able to speak in full and complete sentences, no audible stridor or wheeze, no coughing Neurological:     Mental Status: He is alert.  Psychiatric:        Mood and Affect: Mood normal.        Behavior: Behavior normal.    PE limited by telephone encounter  No results found for this or any previous visit (from the past 72 hour(s)).  PHQ2/9: Depression screen [facility] 2/9 08/19/2019 02/10/2019 07/15/2018 06/24/2018 01/13/2018  Decreased Interest 0 0 0 0 0  Down, Depressed, Hopeless 0 0 0 0 1  PHQ - 2 Score 0 0 0 0 1  Altered sleeping 1 0 0 0 3  Tired, decreased energy 1 0 0 0 1  Change in appetite 0 0 0 0 1  Feeling bad or failure about yourself  0 0 0 0 0  Trouble concentrating 0 0 0 0 1  Moving slowly or fidgety/restless 0 0 0 0 0  Suicidal thoughts 0 0 0 0 0  PHQ-9 Score 2 0 0 0 7  Difficult doing work/chores Not difficult at all Not difficult at all Not difficult at all Not difficult at all Not difficult at all   PHQ-2/9 Result is negative, reviewed today   Fall Risk: Fall Risk  08/19/2019 02/10/2019 07/15/2018 06/24/2018 01/13/2018  Falls in the past year? 0 0 0 1 No  Number falls in past yr: 0 0 - 0 -  Injury with Fall? 0 0 - 0 -    Assessment and Plan:     ICD-10-CM   1. Essential hypertension  I10 olmesartan (BENICAR) 40 MG tablet    CMP w GFR    unstable, BP elevated, increase benicar dose, needs, labs, monitor BP at home, and close f/up in 1 month  2. Mixed hyperlipidemia  E78.2 atorvastatin (LIPITOR) 10 MG tablet    CMP w GFR    Lipid Panel   untreated despite rx multiple times in the past, discussed at length ASCVD risk of MI/stroke, with med hx and risk, encouraged him to try statin, will try 3x/wk  3. PTSD (post-traumatic stress disorder)  F43.10 FLUoxetine (PROZAC) 20 MG capsule   stable, well controlled with prozac and trazodone  4. Insomnia, unspecified type  G47.00 traZODone (DESYREL) 50 MG tablet   stable, well controlled with trazodone, no concerns or SE  5. Chronic obstructive pulmonary disease, unspecified COPD type (HCC)  J44.9 albuterol (VENTOLIN HFA) 108 (90 Base) MCG/ACT inhaler    budesonide-formoterol (SYMBICORT) 160-4.5 MCG/ACT inhaler   may be undermanaged with symbicort, using SABA most days, will recheck with f/up appt in office and get better hx, may try trelegy/breo etc  6. Encounter for medication monitoring  Z51.81 CBC w/ Diff    CMP w GFR    Lipid Panel   I discussed the assessment and treatment plan with the patient. The patient was provided  an opportunity to ask questions and all were answered. The patient agreed with the plan and demonstrated an understanding of the instructions.  The patient was advised to call back or seek an in-person evaluation if the symptoms worsen or if the condition fails to improve as anticipated.  I provided 20 minutes of non-face-to-face time during this encounter.  Delsa Grana, PA-C 08/19/19 10:21 AM

## 2019-08-26 LAB — COMPLETE METABOLIC PANEL WITH GFR
AG Ratio: 1.8 (calc) (ref 1.0–2.5)
ALT: 41 U/L (ref 9–46)
AST: 29 U/L (ref 10–35)
Albumin: 4 g/dL (ref 3.6–5.1)
Alkaline phosphatase (APISO): 81 U/L (ref 35–144)
BUN: 17 mg/dL (ref 7–25)
CO2: 26 mmol/L (ref 20–32)
Calcium: 9 mg/dL (ref 8.6–10.3)
Chloride: 105 mmol/L (ref 98–110)
Creat: 1.06 mg/dL (ref 0.70–1.18)
GFR, Est African American: 80 mL/min/{1.73_m2} (ref >=60)
GFR, Est Non African American: 69 mL/min/{1.73_m2} (ref >=60)
Globulin: 2.2 g/dL (calc) (ref 1.9–3.7)
Glucose, Bld: 81 mg/dL (ref 65–99)
Potassium: 4.1 mmol/L (ref 3.5–5.3)
Sodium: 142 mmol/L (ref 135–146)
Total Bilirubin: 0.7 mg/dL (ref 0.2–1.2)
Total Protein: 6.2 g/dL (ref 6.1–8.1)

## 2019-08-26 LAB — CBC WITH DIFFERENTIAL/PLATELET
Absolute Monocytes: 587 cells/uL (ref 200–950)
Basophils Absolute: 59 cells/uL (ref 0–200)
Basophils Relative: 0.9 %
Eosinophils Absolute: 350 cells/uL (ref 15–500)
Eosinophils Relative: 5.3 %
HCT: 46.6 % (ref 38.5–50.0)
Hemoglobin: 15.8 g/dL (ref 13.2–17.1)
Lymphs Abs: 1835 cells/uL (ref 850–3900)
MCH: 30.2 pg (ref 27.0–33.0)
MCHC: 33.9 g/dL (ref 32.0–36.0)
MCV: 88.9 fL (ref 80.0–100.0)
MPV: 10 fL (ref 7.5–12.5)
Monocytes Relative: 8.9 %
Neutro Abs: 3769 cells/uL (ref 1500–7800)
Neutrophils Relative %: 57.1 %
Platelets: 213 10*3/uL (ref 140–400)
RBC: 5.24 10*6/uL (ref 4.20–5.80)
RDW: 12.5 % (ref 11.0–15.0)
Total Lymphocyte: 27.8 %
WBC: 6.6 10*3/uL (ref 3.8–10.8)

## 2019-08-26 LAB — LIPID PANEL
Cholesterol: 145 mg/dL (ref <200)
HDL: 52 mg/dL (ref >=40)
LDL Cholesterol (Calc): 73 mg/dL (calc)
Non-HDL Cholesterol (Calc): 93 mg/dL (calc) (ref <130)
Total CHOL/HDL Ratio: 2.8 (calc) (ref <5.0)
Triglycerides: 116 mg/dL (ref <150)

## 2019-08-28 ENCOUNTER — Other Ambulatory Visit: Payer: Self-pay | Admitting: Urology

## 2019-08-28 ENCOUNTER — Other Ambulatory Visit: Payer: Self-pay | Admitting: Family Medicine

## 2019-08-28 DIAGNOSIS — I10 Essential (primary) hypertension: Secondary | ICD-10-CM

## 2019-08-28 MED ORDER — OLMESARTAN MEDOXOMIL 40 MG PO TABS: 40.0000 mg | ORAL_TABLET | Freq: Every day | ORAL | 3 refills | Status: DC

## 2019-08-28 NOTE — Telephone Encounter (Signed)
Called pharmacy to cancel 20mg , but pt had already picked up.  They did have new one on file and we fill.  Pt notified to double up on the 20mg  he picked up.

## 2019-08-28 NOTE — Telephone Encounter (Signed)
I dont see on med list?

## 2019-09-21 ENCOUNTER — Encounter: Payer: Self-pay | Admitting: Family Medicine

## 2019-09-21 ENCOUNTER — Other Ambulatory Visit: Payer: Self-pay

## 2019-09-21 ENCOUNTER — Ambulatory Visit (INDEPENDENT_AMBULATORY_CARE_PROVIDER_SITE_OTHER): Payer: Federal, State, Local not specified - PPO | Admitting: Family Medicine

## 2019-09-21 VITALS — BP 132/80 | HR 93 | Temp 97.9°F | Resp 14 | Ht 67.0 in | Wt 243.0 lb

## 2019-09-21 DIAGNOSIS — R040 Epistaxis: Secondary | ICD-10-CM

## 2019-09-21 DIAGNOSIS — R42 Dizziness and giddiness: Secondary | ICD-10-CM | POA: Diagnosis not present

## 2019-09-21 DIAGNOSIS — I10 Essential (primary) hypertension: Secondary | ICD-10-CM

## 2019-09-21 MED ORDER — MECLIZINE HCL 12.5 MG PO TABS: 12.5000 mg | ORAL_TABLET | Freq: Three times a day (TID) | ORAL | 1 refills | Status: DC | PRN

## 2019-09-21 NOTE — Patient Instructions (Addendum)
Thank you for your request for information about the COVID vaccine We strongly believe that every patient should get vaccinated who is able to. Please reference the [place] department of health and human services website for up to date information about vaccine roll out decisions and availablility.   You may also reference [facility] website for up to date local information regarding vaccine availability in our communities  [email or web address]  [email or web address]  How do people who are 38 and older get their vaccine? Because vaccine supplies are still limited, those 65 and over may have to wait, but they have one of the first spots to take their shot. If you are 50 or over--or assisting someone who is--here is how to take your shot against COVID-19:  Supplies are very limited. Right now, very few vaccine doses are available. You will likely need an appointment to get vaccinated. You may have to wait to schedule your appointment to get your vaccine. A variety of providers can help you get your shot. Because supplies are very limited right now, most doctors cannot provide vaccinations in their offices. Find your local COVID-19 vaccine provider. Visit [employer]. Because vaccine supplies are very limited, providers on this list may have very little to no vaccine doses available when you contact them. You can also call the COVID-19 Line [phone]. It's a free call. The COVID-19 vaccine will be available to everyone for free, whether or not you have health insurance. You will need two shots to build up your immunity. You will get a printed card and email to remind you to come back 3 to 4 weeks later for your second dose. Your personal information is private and strictly confidential.     Department of Health and Human Services  Phase 1a  July 28, 2019   [email or web address]  COVID-19 Vaccinations: Slow the spread and save lives.  A tested, safe and effective vaccine will be available to all who want it, but supplies will be limited at first. Independent state and federal public health advisory committees have determined that the best way to fight COVID-19 is to start first with vaccinations for those most at risk, reaching more people as the vaccine supply increases.  Keep practicing the 3W's--wear a mask, wait six feet apart, wash your hands--until everyone has a chance to be vaccinated.  PHASE 1a:  HEALTH CARE WORKERS FIGHTING COVID-19 & LONG-TERM CARE  The goal is to protect the health care workers who care for patients with COVID-19, those working on the vaccination rollout, and [employer] who are at the highest risk of being hospitalized or dying from COVID-19.   Health care workers at high risk for exposure to COVID-19 are defined as those: . caring for patients with COVID-19 . working directly in areas where patients with COVID-19 are cared for, including staff responsible for cleaning, providing food service, and maintenance in those areas . performing procedures on patients with COVID-19 that put them at risk, such as intubation, bronchoscopy, suctioning, invasive dental procedures, invasive specimen collection, and CPR . handling people for who have died from [place] include people and staff in skilled nursing facilities and in adult, family and group homes: . adult care homes . family care homes . group homes . skilled nursing facilities . group homes for people with intellectual and developmental disabilities who receive home or communitybased services . in-patient hospice facilities

## 2019-09-21 NOTE — Progress Notes (Signed)
Patient ID: Willie Crane, male    DOB: Oct 15, 1945, 74 y.o.   MRN: [id number]  PCP: Delsa Grana, PA-C  Chief Complaint  Patient presents with  . Follow-up  . Hypertension  . Hyperlipidemia    Subjective:   Willie Crane is a 74 y.o. male, presents to clinic with CC of the following:  Pt here for blood pressure recheck. Lab OV was one month ago - virtual encounter- and home blood pressure reading was elevated to 151/103  BP Readings from Last 5 Encounters:  09/21/19 132/80  08/19/19 (!) 151/103  02/10/19 138/89  09/05/18 (!) 146/82  07/22/18 (!) 156/85    Meds were changed from benicar 20 mg daily to 40 mg daily. Pt reports home BP readings are as follows: 146/96, 153/97, 143/106, 111/81, 141/952, 160/98, 151/95, 138/90  Taking meds as directed w/o problems, reports he is compliant with med changes - but only started a few days ago. Pt denies chest pain, SOB or heart palpitations.  Denies medication side effects.   He has COPD/asthma hx, no change to his chronic respiratory sx  Having some dizzy episodes when rolliing over in bed with vertigo sx last a few seconds,. Has had in the past.   Last visit 08/19/2019-Nose bleeds which is new, last one was a couple weeks ago, had a few nose bleeds in the past 6 months Today states in the last month he's had 2 nose bleeds  Patient Active Problem List   Diagnosis Date Noted  . PTSD (post-traumatic stress disorder) 02/10/2019  . Morbid obesity ([place]) 06/30/2018  . Coronary artery disease 06/30/2018  . Benign essential HTN 05/19/2018  . Stable angina pectoris ([place]) 05/19/2018  . Stage 2 moderate COPD by GOLD classification ([place]) 02/12/2017  . Hypomagnesemia 11/16/2016  . Hypokalemia 11/16/2016  . History of acute myocardial infarction 09/27/2016  . Prolonged Q-T interval on ECG 09/27/2016  . Moderate persistent asthma 09/28/2015  . 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 Hydrocele, bilateral  07/12/2015  . Hyperlipidemia       Current Outpatient Medications:  .  acetaminophen (TYLENOL) 325 MG tablet, Take 2 tablets (650 mg total) by mouth every 6 (six) hours as needed for mild pain or fever., Disp: , Rfl:  .  albuterol (VENTOLIN HFA) 108 (90 Base) MCG/ACT inhaler, Inhale 2 puffs into the lungs every 4 (four) hours as needed for wheezing or shortness of breath., Disp: 18 g, Rfl: 11 .  ascorbic acid (VITAMIN C) 250 MG tablet, Take by mouth., Disp: , Rfl:  .  aspirin EC 81 MG tablet, Take 1 tablet (81 mg total) by mouth daily., Disp: , Rfl:  .  atorvastatin (LIPITOR) 10 MG tablet, TAKE 1 TABLET BY MOUTH EVERYDAY AT BEDTIME, Disp: 90 tablet, Rfl: 3 .  budesonide-formoterol (SYMBICORT) 160-4.5 MCG/ACT inhaler, 2 puffs BID, Disp: 3 Inhaler, Rfl: 3 .  FLUoxetine (PROZAC) 20 MG capsule, TAKE 1 CAPSULE BY MOUTH EVERY DAY, Disp: 90 capsule, Rfl: 1 .  Multiple Vitamins-Minerals (MULTI ADULT GUMMIES) CHEW, Chew 1 tablet by mouth daily., Disp: , Rfl:  .  traZODone (DESYREL) 50 MG tablet, Take 0.5-1 tablets (25-50 mg total) by mouth at bedtime as needed for sleep., Disp: 90 tablet, Rfl: 1 .  triamcinolone ointment (KENALOG) 0.5 %, APPLY TO AFFECTED AREA TWICE A DAY, Disp: 30 g, Rfl: 2 .  finasteride (PROSCAR) 5 MG tablet, TAKE 1 TABLET BY MOUTH EVERY DAY (Patient not taking: Reported on 09/21/2019), Disp: 90 tablet,  Rfl: 3 .  olmesartan (BENICAR) 40 MG tablet, Take 1 tablet (40 mg total) by mouth daily., Disp: 90 tablet, Rfl: 3   Allergies  Allergen Reactions  . Other Swelling    Lips swell - Potatoes, Kiwi, carrots, celery (raw)  general anesthesia - nausea      Family History  Problem Relation Age of Onset  . Kidney failure Mother   . Cancer Mother   . Cancer Father        lung  . Prostate cancer Neg Hx     Chart Review Today: I personally reviewed active problem list, medication list, allergies, family history, social history, health maintenance, notes from last encounter, lab  results, imaging with the patient/caregiver today.  Review of Systems  Constitutional: Negative.   HENT: Negative.   Eyes: Negative.   Respiratory: Negative.   Cardiovascular: Negative.   Gastrointestinal: Negative.   Endocrine: Negative.   Genitourinary: Negative.   Musculoskeletal: Negative.   Skin: Negative.   Allergic/Immunologic: Negative.   Neurological: Negative.   Hematological: Negative.   Psychiatric/Behavioral: Negative.   All other systems reviewed and are negative.      Objective:   Vitals:   09/21/19 1025  Pulse: 93  Resp: 14  Temp: 97.9 F (36.6 C)  SpO2: 96%  Weight: 243 lb (110.2 kg)  Height: 5\' 7"  (1.702 m)    Body mass index is 38.06 kg/m.  Physical Exam Vitals and nursing note reviewed.  Constitutional:      General: He is not in acute distress.    Appearance: Normal appearance. He is well-developed. He is obese. He is not ill-appearing, toxic-appearing or diaphoretic.     Interventions: Face mask in place.  HENT:     Head: Normocephalic and atraumatic.     Jaw: No trismus.     Right Ear: External ear normal.     Left Ear: External ear normal.     Nose: Congestion and rhinorrhea present.     Comments: B/l nasal mucosa edematous and erythematous, left nare turbinate enlarged Eyes:     General: Lids are normal. No scleral icterus.    Conjunctiva/sclera: Conjunctivae normal.     Pupils: Pupils are equal, round, and reactive to light.  Neck:     Trachea: Trachea and phonation normal. No tracheal deviation.  Cardiovascular:     Rate and Rhythm: Normal rate and regular rhythm.     Pulses: Normal pulses.          Radial pulses are 2+ on the right side and 2+ on the left side.       Posterior tibial pulses are 2+ on the right side and 2+ on the left side.     Heart sounds: Normal heart sounds. No murmur. No friction rub. No gallop.   Pulmonary:     Effort: Pulmonary effort is normal. No respiratory distress.     Breath sounds: Normal breath  sounds. No stridor. No wheezing, rhonchi or rales.  Abdominal:     General: Bowel sounds are normal. There is no distension.     Palpations: Abdomen is soft.     Tenderness: There is no abdominal tenderness. There is no guarding or rebound.  Musculoskeletal:        General: Normal range of motion.     Cervical back: Normal range of motion and neck supple.     Right lower leg: Edema present.     Left lower leg: Edema present.  Skin:    General: Skin  is warm and dry.     Capillary Refill: Capillary refill takes less than 2 seconds.     Coloration: Skin is not jaundiced.     Findings: No rash.     Nails: There is no clubbing.  Neurological:     Mental Status: He is alert.     Cranial Nerves: No dysarthria or facial asymmetry.     Motor: No tremor or abnormal muscle tone.     Gait: Gait normal.  Psychiatric:        Mood and Affect: Mood normal.        Speech: Speech normal.        Behavior: Behavior normal. Behavior is cooperative.      Results for orders placed or performed in visit on 08/19/19  CBC w/ Diff  Result Value Ref Range   WBC 6.6 3.8 - 10.8 Thousand/uL   RBC 5.24 4.20 - 5.80 Million/uL   Hemoglobin 15.8 13.2 - 17.1 g/dL   HCT 46.6 38.5 - 50.0 %   MCV 88.9 80.0 - 100.0 fL   MCH 30.2 27.0 - 33.0 pg   MCHC 33.9 32.0 - 36.0 g/dL   RDW 12.5 11.0 - 15.0 %   Platelets 213 140 - 400 Thousand/uL   MPV 10.0 7.5 - 12.5 fL   Neutro Abs 3,769 1,500 - 7,800 cells/uL   Lymphs Abs 1,835 850 - 3,900 cells/uL   Absolute Monocytes 587 200 - 950 cells/uL   Eosinophils Absolute 350 15 - 500 cells/uL   Basophils Absolute 59 0 - 200 cells/uL   Neutrophils Relative % 57.1 %   Total Lymphocyte 27.8 %   Monocytes Relative 8.9 %   Eosinophils Relative 5.3 %   Basophils Relative 0.9 %  CMP w GFR  Result Value Ref Range   Glucose, Bld 81 65 - 99 mg/dL   BUN 17 7 - 25 mg/dL   Creat 1.06 0.70 - 1.18 mg/dL   GFR, Est Non African American 69 > OR = 60 mL/min/1.61m2   GFR, Est African  American 80 > OR = 60 mL/min/1.23m2   BUN/Creatinine Ratio NOT APPLICABLE 6 - 22 (calc)   Sodium 142 135 - 146 mmol/L   Potassium 4.1 3.5 - 5.3 mmol/L   Chloride 105 98 - 110 mmol/L   CO2 26 20 - 32 mmol/L   Calcium 9.0 8.6 - 10.3 mg/dL   Total Protein 6.2 6.1 - 8.1 g/dL   Albumin 4.0 3.6 - 5.1 g/dL   Globulin 2.2 1.9 - 3.7 g/dL (calc)   AG Ratio 1.8 1.0 - 2.5 (calc)   Total Bilirubin 0.7 0.2 - 1.2 mg/dL   Alkaline phosphatase (APISO) 81 35 - 144 U/L   AST 29 10 - 35 U/L   ALT 41 9 - 46 U/L  Lipid Panel  Result Value Ref Range   Cholesterol 145 <200 mg/dL   HDL 52 > OR = 40 mg/dL   Triglycerides 116 <150 mg/dL   LDL Cholesterol (Calc) 73 mg/dL (calc)   Total CHOL/HDL Ratio 2.8 <5.0 (calc)   Non-HDL Cholesterol (Calc) 93 <130 mg/dL (calc)        Assessment & Plan:      ICD-10-CM   1. Essential hypertension  I10    stable, controlled, continue 40 mg benicar dose, f/up with cardiology, 4 month f/up here or sooner if BP >140/90  2. Bleeding nose  R04.0 Ambulatory referral to ENT   intermittent nose bleeds to left nare x 6-7 months  3. Vertigo  R42 Ambulatory referral to ENT   clinically consistent with BPPV, reproducible with head movements and rolling over in bed, trial of meclizine, ENT f/up         Delsa Grana, PA-C 09/21/19 10:29 AM

## 2019-10-29 ENCOUNTER — Other Ambulatory Visit: Payer: Self-pay | Admitting: Internal Medicine

## 2019-10-29 DIAGNOSIS — J449 Chronic obstructive pulmonary disease, unspecified: Secondary | ICD-10-CM

## 2019-11-19 ENCOUNTER — Other Ambulatory Visit: Payer: Self-pay | Admitting: Internal Medicine

## 2019-11-24 ENCOUNTER — Other Ambulatory Visit: Payer: Self-pay | Admitting: Internal Medicine

## 2019-12-17 ENCOUNTER — Other Ambulatory Visit: Payer: Self-pay | Admitting: Internal Medicine

## 2019-12-26 ENCOUNTER — Other Ambulatory Visit: Payer: Self-pay | Admitting: Urology

## 2019-12-28 NOTE — Telephone Encounter (Signed)
Pt last seen 2019, needs appt, refill denied

## 2020-01-05 NOTE — Telephone Encounter (Signed)
Patient called to check on the status of his refill request.  He already has an appt. Schedule for 6/15 and only has 2 pills left of his medication.  Would like to get enough pills to last him until his appt.  Please advise.

## 2020-01-05 NOTE — Telephone Encounter (Signed)
Pt states no longer see's [facility]?

## 2020-01-05 NOTE — Addendum Note (Signed)
Addended by: Docia Furl on: 01/05/2020 03:17 PM   Modules accepted: Orders

## 2020-01-05 NOTE — Telephone Encounter (Signed)
Pt last appt was 09/21/2019

## 2020-01-06 NOTE — Telephone Encounter (Signed)
Please clarify with pt that this med was from urology and THEY are requesting a f/up visit to refill

## 2020-01-07 NOTE — Addendum Note (Signed)
Addended by: Docia Furl on: 01/07/2020 09:07 AM   Modules accepted: Orders

## 2020-01-15 ENCOUNTER — Other Ambulatory Visit: Payer: Self-pay | Admitting: Urology

## 2020-01-19 ENCOUNTER — Encounter: Payer: Self-pay | Admitting: Family Medicine

## 2020-01-19 ENCOUNTER — Other Ambulatory Visit: Payer: Self-pay

## 2020-01-19 ENCOUNTER — Ambulatory Visit: Payer: Federal, State, Local not specified - PPO | Admitting: Family Medicine

## 2020-01-19 VITALS — BP 136/86 | HR 82 | Temp 97.9°F | Resp 16 | Ht 67.0 in | Wt 235.4 lb

## 2020-01-19 DIAGNOSIS — E782 Mixed hyperlipidemia: Secondary | ICD-10-CM | POA: Diagnosis not present

## 2020-01-19 DIAGNOSIS — F4310 Post-traumatic stress disorder, unspecified: Secondary | ICD-10-CM | POA: Diagnosis not present

## 2020-01-19 DIAGNOSIS — G4700 Insomnia, unspecified: Secondary | ICD-10-CM

## 2020-01-19 DIAGNOSIS — I10 Essential (primary) hypertension: Secondary | ICD-10-CM

## 2020-01-19 DIAGNOSIS — J449 Chronic obstructive pulmonary disease, unspecified: Secondary | ICD-10-CM

## 2020-01-19 DIAGNOSIS — I25119 Atherosclerotic heart disease of native coronary artery with unspecified angina pectoris: Secondary | ICD-10-CM

## 2020-01-19 MED ORDER — TRAZODONE HCL 50 MG PO TABS: 50.0000 mg | ORAL_TABLET | Freq: Every evening | ORAL | 3 refills | Status: DC | PRN

## 2020-01-19 MED ORDER — FLUOXETINE HCL 20 MG PO CAPS: ORAL_CAPSULE | ORAL | 3 refills | Status: DC

## 2020-01-19 NOTE — Progress Notes (Signed)
Name: NYGEL PROKOP   MRN: [id number]    DOB: 07-Jul-1946   Date:01/19/2020       Progress Note  Chief Complaint  Patient presents with  . Follow-up  . Hyperlipidemia  . Depression  . Insomnia  . Hypertension     Subjective:   Willie Crane is a 74 y.o. male, presents to clinic for routine follow up on the conditions listed above.  Pt reports he is trying to change his diet, loose some weight.  HTN on benicar takes daily, good compliance, no SE or concerns, he was checking his BP at home more regularly a while ago, doesn't check it as much any more, but it used to run around 140/90 He has lost some weight, today BP a little better and well controlled for age-he states is getting better because of his lifestyle changes and weight loss. BP Readings from Last 3 Encounters:  01/19/20 136/86  09/21/19 132/80  08/19/19 (!) 151/103  Labs done previously were reviewed today patient has normal GFR and electrolytes, no recent electrolyte abnormality though it is noted on his chart.  Previously had low calcium and hyperkalemia but labs have been normal since 2019  HLD -he takes atorvastatin 10 mg daily at bedtime, notes good compliance, denies any myalgias or side effects.  Denies any claudication type symptoms He does have history of coronary artery disease, he currently denies any chest pain Lab Results  Component Value Date   CHOL 145 08/25/2019   HDL 52 08/25/2019   LDLCALC 73 08/25/2019   TRIG 116 08/25/2019   CHOLHDL 2.8 08/25/2019  Lipid panel has been done earlier this year LDL is well controlled and at goal total cholesterol normal, hepatic function is normal   COPD-he reports stable symptoms, managed by pulmonology, on Symbicort and uses rescue inhaler only about once a week  Insomnia-he has been using trazodone for some time for sleep the prior dosing is noted to be 25 mg to 50 mg at bedtime, he states that he always takes 50 mg tablet at bedtime and it helps him get  some sleep.  He does ask if he can increase the dose.  He currently needs a refill  MDD? /PTSD Med refill for Prozac-he is on 20 mg Prozac daily Depression screen [facility] 2/9 01/19/2020 09/21/2019 08/19/2019 02/10/2019 07/15/2018  Decreased Interest 0 0 0 0 0  Down, Depressed, Hopeless 0 0 0 0 0  PHQ - 2 Score 0 0 0 0 0  Altered sleeping 1 1 1  0 0  Tired, decreased energy 0 0 1 0 0  Change in appetite 0 0 0 0 0  Feeling bad or failure about yourself  0 0 0 0 0  Trouble concentrating 0 0 0 0 0  Moving slowly or fidgety/restless 0 0 0 0 0  Suicidal thoughts 0 0 0 0 0  PHQ-9 Score 1 1 2  0 0  Difficult doing work/chores Not difficult at all Not difficult at all Not difficult at all Not difficult at all Not difficult at all  Some recent data might be hidden   PHQ reviewed today, not getting his go to sleep as he would like but otherwise he denies any depressive thoughts, SI, decreased interest or mood swings He reports that Prozac is mostly used for his diagnosis of PTSD  Reviewed past OV note from 02/2019 Benjamine Mola [place]:   "PTSD Patient is taking prozac for PTSD related to [place] war- was replaying situations, no longer  doing that on the prozac. "     Patient Active Problem List   Diagnosis Date Noted  . PTSD (post-traumatic stress disorder) 02/10/2019  . Morbid obesity ([place]) 06/30/2018  . Coronary artery disease 06/30/2018  . Benign essential HTN 05/19/2018  . Stable angina pectoris ([place]) 05/19/2018  . Stage 2 moderate COPD by GOLD classification ([place]) 02/12/2017  . Hypomagnesemia 11/16/2016  . Hypokalemia 11/16/2016  . History of acute myocardial infarction 09/27/2016  . Prolonged Q-T interval on ECG 09/27/2016  . Moderate persistent asthma 09/28/2015  . 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 Hydrocele, bilateral 07/12/2015  . Hyperlipidemia     Past Surgical History:  Procedure Laterality Date  . BACK SURGERY    . CARDIAC CATHETERIZATION  03/2009    [facility]: No significant coronary artery disease  . CARDIAC CAT[facility]IZATION  01/2014   [facility]  . CARPAL TUNNEL RELEASE Right   . CHOLECYSTECTOMY    . COLONOSCOPY    . COLONOSCOPY WITH PROPOFOL N/A 10/31/2015   Procedure: COLONOSCOPY WITH PROPOFOL;  Surgeon: Lucilla Lame, MD;  Location: [place];  Service: Endoscopy;  Laterality: N/A;  . CYST EXCISION    . CYSTOSCOPY W/ URETERAL STENT PLACEMENT Left 10/26/2016   Procedure: CYSTOSCOPY WITH STENT REPLACEMENT;  Surgeon: Nickie Retort, MD;  Location: [facility];  Service: Urology;  Laterality: Left;  . CYSTOSCOPY WITH STENT PLACEMENT Left 10/05/2016   Procedure: CYSTOSCOPY WITH STENT PLACEMENT;  Surgeon: Nickie Retort, MD;  Location: [facility];  Service: Urology;  Laterality: Left;  . HERNIA REPAIR    . INGUINAL HERNIA REPAIR Right   . INGUINAL HERNIA REPAIR Left 01/26/2016   Procedure: HERNIA REPAIR INGUINAL ADULT;  Surgeon: Christene Lye, MD;  Location: [facility];  Service: General;  Laterality: Left;  . KNEE ARTHROSCOPY    . LUMBAR LAMINECTOMY    . POLYPECTOMY  10/31/2015   Procedure: POLYPECTOMY;  Surgeon: Lucilla Lame, MD;  Location: [place];  Service: Endoscopy;;  . URETEROSCOPY Left 10/05/2016   Procedure: URETEROSCOPY;  Surgeon: Nickie Retort, MD;  Location: [facility];  Service: Urology;  Laterality: Left;  . URETEROSCOPY WITH HOLMIUM LASER LITHOTRIPSY Left 10/26/2016   Procedure: URETEROSCOPY WITH HOLMIUM LASER LITHOTRIPSY;  Surgeon: Nickie Retort, MD;  Location: [facility];  Service: Urology;  Laterality: Left;  Marland Kitchen VASECTOMY      Family History  Problem Relation Age of Onset  . Kidney failure Mother   . Cancer Mother   . Cancer Father        lung  . Prostate cancer Neg Hx     Social History   Tobacco Use  . Smoking status: Former Smoker    Packs/day: 1.00    Years: 20.00    Pack years: 20.00    Types: Cigarettes    Quit date: 01/19/1979    Years since quitting: 41.0  . Smokeless tobacco:  Former Systems developer    Types: Secondary school teacher  . Vaping Use: Never used  Substance Use Topics  . Alcohol use: Yes    Alcohol/week: 0.0 standard drinks    Comment: Occasional Wine  . Drug use: No      Current Outpatient Medications:  .  acetaminophen (TYLENOL) 325 MG tablet, Take 2 tablets (650 mg total) by mouth every 6 (six) hours as needed for mild pain or fever., Disp: , Rfl:  .  albuterol (VENTOLIN HFA) 108 (90 Base) MCG/ACT inhaler, INHALE 2 PUFFS INTO THE LUNGS EVERY 4 (  FOUR) HOURS AS NEEDED FOR WHEEZING OR SHORTNESS OF BREATH., Disp: 18 g, Rfl: 2 .  ascorbic acid (VITAMIN C) 250 MG tablet, Take by mouth., Disp: , Rfl:  .  atorvastatin (LIPITOR) 10 MG tablet, TAKE 1 TABLET BY MOUTH EVERYDAY AT BEDTIME, Disp: 90 tablet, Rfl: 3 .  budesonide-formoterol (SYMBICORT) 160-4.5 MCG/ACT inhaler, 2 puffs BID, Disp: 3 Inhaler, Rfl: 3 .  FLUoxetine (PROZAC) 20 MG capsule, TAKE 1 CAPSULE BY MOUTH EVERY DAY, Disp: 90 capsule, Rfl: 1 .  Multiple Vitamins-Minerals (MULTI ADULT GUMMIES) CHEW, Chew 1 tablet by mouth daily., Disp: , Rfl:  .  olmesartan (BENICAR) 40 MG tablet, Take 1 tablet (40 mg total) by mouth daily., Disp: 90 tablet, Rfl: 3 .  traZODone (DESYREL) 50 MG tablet, Take 0.5-1 tablets (25-50 mg total) by mouth at bedtime as needed for sleep., Disp: 90 tablet, Rfl: 1 .  finasteride (PROSCAR) 5 MG tablet, TAKE 1 TABLET BY MOUTH EVERY DAY (Patient not taking: Reported on 09/21/2019), Disp: 90 tablet, Rfl: 3  Allergies  Allergen Reactions  . Other Swelling    Lips swell - Potatoes, Kiwi, carrots, celery (raw)  general anesthesia - nausea     Chart Review Today: I personally reviewed active problem list, medication list, allergies, family history, social history, health maintenance, notes from last encounter, lab results, imaging with the patient/caregiver today.   Review of Systems  10 Systems reviewed and are negative for acute change except as noted in the HPI.  Objective:    Vitals:    01/19/20 1547  BP: 136/86  Pulse: 82  Resp: 16  Temp: 97.9 F (36.6 C)  TempSrc: Temporal  SpO2: 98%  Weight: 235 lb 6.4 oz (106.8 kg)  Height: 5\' 7"  (1.702 m)    Body mass index is 36.87 kg/m.  Physical Exam Vitals and nursing note reviewed.  Constitutional:      General: He is not in acute distress.    Appearance: Normal appearance. He is well-developed. He is obese. He is not ill-appearing, toxic-appearing or diaphoretic.  HENT:     Head: Normocephalic and atraumatic.  Eyes:     General:        Right eye: No discharge.        Left eye: No discharge.     Conjunctiva/sclera: Conjunctivae normal.  Neck:     Trachea: No tracheal deviation.  Cardiovascular:     Rate and Rhythm: Normal rate and regular rhythm.     Pulses: Normal pulses.     Heart sounds: Normal heart sounds. No murmur heard.  No gallop.   Pulmonary:     Effort: Pulmonary effort is normal. No respiratory distress.     Breath sounds: Normal breath sounds. No stridor.  Abdominal:     General: Bowel sounds are normal.     Palpations: Abdomen is soft.  Musculoskeletal:     Right lower leg: No edema.     Left lower leg: No edema.  Skin:    General: Skin is warm and dry.     Findings: No rash.  Neurological:     Mental Status: He is alert.     Motor: No abnormal muscle tone.     Coordination: Coordination normal.  Psychiatric:        Mood and Affect: Mood normal.        Behavior: Behavior normal.       Fall Risk: Fall Risk  01/19/2020 09/21/2019 08/19/2019 02/10/2019 07/15/2018  Falls in the past year? 0 0  0 0 0  Number falls in past yr: 0 0 0 0 -  Injury with Fall? 0 0 0 0 -  Fall risk assessment reviewed today, negative  Functional Status Survey: Is the patient deaf or have difficulty hearing?: No Does the patient have difficulty seeing, even when wearing glasses/contacts?: No Does the patient have difficulty concentrating, remembering, or making decisions?: No Does the patient have  difficulty walking or climbing stairs?: No Does the patient have difficulty dressing or bathing?: No Does the patient have difficulty doing errands alone such as visiting a doctor's office or shopping?: No   Assessment & Plan:     ICD-10-CM   1. Essential hypertension  I10    Well-controlled on Benicar, last labs reviewed -he has had good and stable renal function and normal electrolytes for the past couple years  2. Mixed hyperlipidemia  E78.2    Last lipid panel reviewed and at goal, labs will be due in about 6 months, doing well with Lipitor 10 mg, stable and controlled no side effects or concerns  3. Insomnia, unspecified type  G47.00 traZODone (DESYREL) 50 MG tablet   Fairly well controlled on trazodone 50 mg at bedtime, patient asked for dose increase but for age prefer to keep him at 50 mg, CCM pharmacy consult?  4. PTSD (post-traumatic stress disorder)  F43.10 FLUoxetine (PROZAC) 20 MG capsule   Symptoms well controlled on Prozac 20 mg daily, no medication side effects or concerns, has been stable for several years, PHQ reviewed and negative today  5. Stage 2 moderate COPD by GOLD classification ([place])  J44.9    Per pulmonology, patient sees Dr.Kasa, patient notes well-controlled COPD and asthma symptoms on Symbicort maintenance inhaler and SABA  6. Morbid obesity ([facility])  E66.01    With CAD, COPD, HTN, HLD - he is working on diet changes and has lost some weight, congratulated, encouraged continued healthy diet and increasing exercise  7. Coronary artery disease involving native coronary artery of native heart with angina pectoris [facility])  I25.119    Sees cardiology, Dr. Rockey Situ, prior MI, on aspirin and ARB not on beta-blocker, no current symptoms, some past symptoms were treated with PPI   All the patient's medications were reviewed today, for many of his meds and diagnoses I previously sent in a year supply in January of this year. Reviewed his past labs which were normal and done only  5 months ago did not feel that we needed to repeat labs for his current conditions and medications.  Will do so at his next follow-up   Return for 5 month f/up HTN, HLD, MDD, insomnia.   Delsa Grana, PA-C 01/19/20 4:16 PM

## 2020-01-20 DIAGNOSIS — G4700 Insomnia, unspecified: Secondary | ICD-10-CM | POA: Insufficient documentation

## 2020-01-21 ENCOUNTER — Encounter: Payer: Self-pay | Admitting: Urology

## 2020-01-21 ENCOUNTER — Other Ambulatory Visit: Payer: Self-pay

## 2020-01-21 ENCOUNTER — Ambulatory Visit (INDEPENDENT_AMBULATORY_CARE_PROVIDER_SITE_OTHER): Payer: Federal, State, Local not specified - PPO | Admitting: Urology

## 2020-01-21 VITALS — BP 162/102 | HR 83 | Ht 67.0 in | Wt 230.0 lb

## 2020-01-21 DIAGNOSIS — Z87442 Personal history of urinary calculi: Secondary | ICD-10-CM | POA: Diagnosis not present

## 2020-01-21 DIAGNOSIS — N401 Enlarged prostate with lower urinary tract symptoms: Secondary | ICD-10-CM | POA: Diagnosis not present

## 2020-01-21 DIAGNOSIS — N138 Other obstructive and reflux uropathy: Secondary | ICD-10-CM | POA: Diagnosis not present

## 2020-01-21 MED ORDER — FINASTERIDE 5 MG PO TABS: 5.0000 mg | ORAL_TABLET | Freq: Every day | ORAL | 3 refills | Status: DC

## 2020-01-21 NOTE — Progress Notes (Signed)
01/21/2020  8:05 AM   [name] 05-Dec-1945 [id number]  Referring provider: Delsa Grana, PA-C [address] [place],  [place] [address]  Chief Complaint  Patient presents with  . Benign Prostatic Hypertrophy    HPI: Willie Crane is a 74 y.o.  male who presents today for his annual follow up for benign prostatic hypertrophy. He has a history of bilateral nephrolithiasis.  BPH WITH LUTS  (prostate and/or bladder) IPSS score: 5/1    Previous score: 3/0     Major complaint(s): None.  Denies any dysuria, hematuria or suprapubic pain.   Currently taking: finasteride 5 mg daily  Denies any recent fevers, chills, nausea or vomiting.  He does not have a family history of PCa.   IPSS    Row Name 01/21/20 1500         International Prostate Symptom Score   How often have you had the sensation of not emptying your bladder? Less than 1 in 5     How often have you had to urinate less than every two hours? Not at All     How often have you found you stopped and started again several times when you urinated? Less than half the time     How often have you found it difficult to postpone urination? Not at All     How often have you had a weak urinary stream? Less than 1 in 5 times     How often have you had to strain to start urination? Not at All     How many times did you typically get up at night to urinate? 1 Time     Total IPSS Score 5       Quality of Life due to urinary symptoms   If you were to spend the rest of your life with your urinary condition just the way it is now how would you feel about that? Pleased            Score:  1-7 Mild 8-19 Moderate 20-35 Severe   Bilateral nephrolithiasis Contrast CT performed in 11/2016 noted small bilateral stones.    He denied any passage of fragments or flank pain.  No stones have been seen on prior x-rays.   His UA today is negative.     PMH: Past Medical History:  Diagnosis Date  . Anxiety   . Asthma    . Benign neoplasm of transverse colon   . Benign paroxysmal positional vertigo   . BPH (benign prostatic hypertrophy)   . Complication of anesthesia   . COPD (chronic obstructive pulmonary disease) ([place])   . Coronary artery disease    history of heart attack  . Degeneration of lumbar or lumbosacral intervertebral disc   . History of kidney stones   . Hyperlipidemia   . Hypertension   . MI (myocardial infarction) ([place])    1980s  . Nephrolithiasis 09/28/2016  . Obesity, unspecified   . PONV (postoperative nausea and vomiting)   . PTSD (post-traumatic stress disorder)   . Renal disorder    kidney stones  . Thoracic or lumbosacral neuritis or radiculitis, unspecified   . Unspecified congenital anomaly of the integument   . Unspecified gastritis and gastroduodenitis without mention of hemorrhage     Surgical History: Past Surgical History:  Procedure Laterality Date  . BACK SURGERY    . CARDIAC CATHETERIZATION  03/2009   [facility]: No significant coronary artery disease  . [facility]ATION  [facility]  . CARPAL TUNNEL RELEASE Right   . CHOLECYSTECTOMY    . COLONOSCOPY    . COLONOSCOPY WITH PROPOFOL N/A 10/31/2015   Procedure: COLONOSCOPY WITH PROPOFOL;  Surgeon: Lucilla Lame, MD;  Location: [facility];  Service: Endoscopy;  Laterality: N/A;  . CYST EXCISION    . CYSTOSCOPY W/ URETERAL STENT PLACEMENT Left 10/26/2016   Procedure: CYSTOSCOPY WITH STENT REPLACEMENT;  Surgeon: Nickie Retort, MD;  Location: [facility];  Service: Urology;  Laterality: Left;  . CYSTOSCOPY WITH STENT PLACEMENT Left 10/05/2016   Procedure: CYSTOSCOPY WITH STENT PLACEMENT;  Surgeon: Nickie Retort, MD;  Location: [facility];  Service: Urology;  Laterality: Left;  . HERNIA REPAIR    . INGUINAL HERNIA REPAIR Right   . INGUINAL HERNIA REPAIR Left 01/26/2016   Procedure: HERNIA REPAIR INGUINAL ADULT;  Surgeon: Christene Lye, MD;  Location: [facility];  Service: General;  Laterality: Left;   . KNEE ARTHROSCOPY    . LUMBAR LAMINECTOMY    . POLYPECTOMY  10/31/2015   Procedure: POLYPECTOMY;  Surgeon: Lucilla Lame, MD;  Location: [place];  Service: Endoscopy;;  . URETEROSCOPY Left 10/05/2016   Procedure: URETEROSCOPY;  Surgeon: Nickie Retort, MD;  Location: [facility];  Service: Urology;  Laterality: Left;  . URETEROSCOPY WITH HOLMIUM LASER LITHOTRIPSY Left 10/26/2016   Procedure: URETEROSCOPY WITH HOLMIUM LASER LITHOTRIPSY;  Surgeon: Nickie Retort, MD;  Location: [facility];  Service: Urology;  Laterality: Left;  Marland Kitchen VASECTOMY      Home Medications:  Allergies as of 01/21/2020      Reactions   Other Swelling   Lips swell - Potatoes, Kiwi, carrots, celery (raw) general anesthesia - nausea       Medication List       Accurate as of January 21, 2020 11:59 PM. If you have any questions, ask your nurse or doctor.        acetaminophen 325 MG tablet Commonly known as: TYLENOL Take 2 tablets (650 mg total) by mouth every 6 (six) hours as needed for mild pain or fever.   albuterol 108 (90 Base) MCG/ACT inhaler Commonly known as: VENTOLIN HFA INHALE 2 PUFFS INTO THE LUNGS EVERY 4 (FOUR) HOURS AS NEEDED FOR WHEEZING OR SHORTNESS OF BREATH.   ascorbic acid 250 MG tablet Commonly known as: VITAMIN C Take by mouth.   atorvastatin 10 MG tablet Commonly known as: LIPITOR TAKE 1 TABLET BY MOUTH EVERYDAY AT BEDTIME   budesonide-formoterol 160-4.5 MCG/ACT inhaler Commonly known as: Symbicort 2 puffs BID   finasteride 5 MG tablet Commonly known as: PROSCAR Take 1 tablet (5 mg total) by mouth daily.   FLUoxetine 20 MG capsule Commonly known as: PROZAC TAKE 1 CAPSULE BY MOUTH EVERY DAY   Multi Adult Gummies Chew Chew 1 tablet by mouth daily.   olmesartan 40 MG tablet Commonly known as: BENICAR Take 1 tablet (40 mg total) by mouth daily.   traZODone 50 MG tablet Commonly known as: DESYREL Take 1 tablet (50 mg total) by mouth at bedtime as needed for sleep.       Allergies:  Allergies  Allergen Reactions  . Other Swelling    Lips swell - Potatoes, Kiwi, carrots, celery (raw)  general anesthesia - nausea    Family History: Family History  Problem Relation Age of Onset  . Kidney failure Mother   . Cancer Mother   . Cancer Father        lung  . Prostate cancer Neg Hx  Social History:  reports that he quit smoking about 41 years ago. His smoking use included cigarettes. He has a 20.00 pack-year smoking history. He has quit using smokeless tobacco.  His smokeless tobacco use included chew. He reports current alcohol use. He reports that he does not use drugs.  ROS: For pertinent review of systems please refer to history of present illness  Physical Exam: BP (!) 162/102   Pulse 83   Ht 5\' 7"  (1.702 m)   Wt 230 lb (104.3 kg)   BMI 36.02 kg/m   Constitutional:  Well nourished. Alert and oriented, No acute distress. HEENT: [place] AT, mask in place  Trachea midline Cardiovascular: No clubbing, cyanosis, or edema. Respiratory: Normal respiratory effort, no increased work of breathing. GI: Abdomen is soft, non tender, non distended, no abdominal masses. Liver and spleen not palpable.  No hernias appreciated.  Stool sample for occult testing is not indicated.   GU: No CVA tenderness.  No bladder fullness or masses.  Patient with buried phallus. Foreskin easily retracted  Urethral meatus is patent.  No penile discharge. No penile lesions or rashes. Scrotum without lesions, cysts, rashes and/or edema.  Testicles are located scrotally bilaterally. No masses are appreciated in the testicles. Left and right epididymis are normal.  Small left hydrocele noted.  Rectal: Patient with  normal sphincter tone. Anus and perineum without scarring or rashes. No rectal masses are appreciated. Prostate is approximately 60 grams, could only palpate the apex, no nodules are appreciated. Seminal vesicles could not be palpated.  Skin: No rashes, bruises or suspicious  lesions. Lymph: No inguinal adenopathy. Neurologic: Grossly intact, no focal deficits, moving all 4 extremities. Psychiatric: Normal mood and affect.  Laboratory Data: Urinalysis Component     Latest Ref Rng & Units 01/21/2020          Specific Gravity, UA     1.005 - 1.030 1.025  pH, UA     5.0 - 7.5 5.5  Color, UA     Yellow Yellow  Appearance Ur     Clear Clear  Leukocytes,UA     Negative Negative  Protein,UA     Negative/Trace Negative  Glucose, UA     Negative Negative  Ketones, UA     Negative Negative  RBC, UA     Negative Trace (A)  Bilirubin, UA     Negative Negative  Urobilinogen, Ur     0.2 - 1.0 mg/dL 0.2  Nitrite, UA     Negative Negative  Microscopic Examination      See below:   Component     Latest Ref Rng & Units 01/21/2020          WBC, UA     0 - 5 /hpf 0-5  RBC     0 - 2 /hpf 0-2  Epithelial Cells (non renal)     0 - 10 /hpf None seen  Bacteria, UA     None seen/Few None seen   Lab Results  Component Value Date   WBC 6.6 08/25/2019   HGB 15.8 08/25/2019   HCT 46.6 08/25/2019   MCV 88.9 08/25/2019   PLT 213 08/25/2019   Lab Results  Component Value Date   CREATININE 1.06 08/25/2019   Results for INES, WARF" (MRN [id number]) as of 07/17/2018 15:50  Ref. Range 07/11/2015 13:33 07/06/2016 13:41 07/09/2017 14:59 07/15/2018 15:42  Prostate Specific Ag, Serum Latest Ref Range: 0.0 - 4.0 ng/mL 1.4 (2.8) 1.3 (2.6) 1.5 (3.0) 1.8 (3.6)  I have reviewed the labs.  Pertinent Imaging CLINICAL DATA:  Kidney stones.  Follow-up  EXAM: ABDOMEN - 1 VIEW  COMPARISON:  CT 11/04/2016  FINDINGS: No visible renal or ureteral stones. Previously seen left ureteral stent on CT has been removed. Nonobstructive bowel gas pattern. Prior cholecystectomy.  IMPRESSION: No visible suspicious calcification.   Electronically Signed   By: Rolm Baptise M.D.   On: 07/18/2018 08:12  Assessment & Plan:    1. BPH with LUTS (prostate  and/or bladder) I-PSS today is 5/1, it has worsened - but pleased with his quality of life due to his urinary symptoms. He admits that he has accepted certain urinary changes that have come with his age. Continue conservative management, avoiding bladder irritants and timed voidings Patient has decided he no longer wants to undergo prostate cancer screening Continue finasteride - refill given RTC in one year for I PSS and exam  2. History of nephrolithiasis No stones seen on KUB in 2019 Patient does not want to have KUB today as he is not having any signs of stone issues UA today is negative for micro heme   Return in about 1 year (around 01/20/2021) for IPSS, PVR and exam.  Laneta Simmers  [place] [address] [place], [place] [address] [phone]

## 2020-01-25 LAB — MICROSCOPIC EXAMINATION
Bacteria, UA: NONE SEEN
Epithelial Cells (non renal): NONE SEEN /hpf (ref 0–10)

## 2020-01-25 LAB — URINALYSIS, COMPLETE
Bilirubin, UA: NEGATIVE
Glucose, UA: NEGATIVE
Ketones, UA: NEGATIVE
Leukocytes,UA: NEGATIVE
Nitrite, UA: NEGATIVE
Protein,UA: NEGATIVE
Specific Gravity, UA: 1.025 (ref 1.005–1.030)
Urobilinogen, Ur: 0.2 mg/dL (ref 0.2–1.0)
pH, UA: 5.5 (ref 5.0–7.5)

## 2020-04-24 ENCOUNTER — Other Ambulatory Visit: Payer: Self-pay | Admitting: Internal Medicine

## 2020-04-24 DIAGNOSIS — J449 Chronic obstructive pulmonary disease, unspecified: Secondary | ICD-10-CM

## 2020-05-31 ENCOUNTER — Other Ambulatory Visit: Payer: Self-pay | Admitting: Internal Medicine

## 2020-05-31 DIAGNOSIS — J449 Chronic obstructive pulmonary disease, unspecified: Secondary | ICD-10-CM

## 2020-06-20 NOTE — Progress Notes (Signed)
Name: Willie Crane   MRN: [id number]    DOB: [birth year]   Date:06/21/2020       Progress Note  Subjective  Chief Complaint  Follow up  HPI  Angina Pectoris: he states possible MI after surgery at the [place], he had two cardiac caths since last one by Dr. Nehemiah Massed and no significant disease but being treated for angina with medical management, last LDL not at goal and we will adjust Atorvastatin to 20 mg and add Metoprolol since bp not at goal and also add aspirin 81 mg.   COPD/ Asthma: he has been using Symbicort , he has a morning cough but not daily, cough is worse now at night and usually dry. He usually sees Dr. Mortimer Fries, taking Symbicort BID, he uses Ventolin multiple times a week, he states since he has not been as active he has noticed decrease in exercise tolerance   Morbid Obesity: BMI is up again , discussed importance of going back to the gym to swim, he is vaccinated. Discussed low portions.   Senile purpura: on arms , gave him reassurance  Insomnia: he takes Trazodone, it is taking him 40 minutes to fall asleep . Discussed going up on the dose, or adding melatonin , he chose to go up to 1.5 for now and if no improvement he will take 2 - total of 100 mg at night   PTSD: he takes prozac and states it works well for him, gets jumpy with loud noises   Skin lesion: both arms, he seems to have old scars, may be picking, but he states the one on right arm has been going on for the past 6 months and not healing.   Patient Active Problem List   Diagnosis Date Noted  . COPD with asthma ([place]) 06/21/2020  . Senile purpura ([place]) 06/21/2020  . Insomnia 01/20/2020  . PTSD (post-traumatic stress disorder) 02/10/2019  . Morbid obesity ([place]) 06/30/2018  . Coronary artery disease 06/30/2018  . Essential hypertension 05/19/2018  . Stable angina pectoris ([place]) 05/19/2018  . Stage 2 moderate COPD by GOLD classification ([place]) 02/12/2017  . History of acute myocardial infarction 09/27/2016  .  Prolonged Q-T interval on ECG 09/27/2016  . Moderate persistent asthma 09/28/2015  . 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 Hydrocele, bilateral 07/12/2015  . Hyperlipidemia     Past Surgical History:  Procedure Laterality Date  . BACK SURGERY    . CARDIAC CATHETERIZATION  03/2009   [facility]: No significant coronary artery disease  . CARDIAC [facility]IZATION  [facility]2014   [facility]  . CARPAL TUNNEL RELEASE Right   . CHOLECYSTECTOMY    . COLONOSCOPY    . COLONOSCOPY WITH PROPOFOL N/A 10/31/2015   Procedure: COLONOSCOPY WITH PROPOFOL;  Surgeon: Lucilla Lame, MD;  Location: [place];  Service: Endoscopy;  Laterality: N/A;  . CYST EXCISION    . CYSTOSCOPY W/ URETERAL STENT PLACEMENT Left 10/26/2016   Procedure: CYSTOSCOPY WITH STENT REPLACEMENT;  Surgeon: Nickie Retort, MD;  Location: [facility];  Service: Urology;  Laterality: Left;  . CYSTOSCOPY WITH STENT PLACEMENT Left 10/05/2016   Procedure: CYSTOSCOPY WITH STENT PLACEMENT;  Surgeon: Nickie Retort, MD;  Location: [facility];  Service: Urology;  Laterality: Left;  . HERNIA REPAIR    . INGUINAL HERNIA REPAIR Right   . INGUINAL HERNIA REPAIR Left 01/26/2016   Procedure: HERNIA REPAIR INGUINAL ADULT;  Surgeon: Christene Lye, MD;  Location: [facility];  Service: General;  Laterality: Left;  .  KNEE ARTHROSCOPY    . LUMBAR LAMINECTOMY    . POLYPECTOMY  10/31/2015   Procedure: POLYPECTOMY;  Surgeon: Lucilla Lame, MD;  Location: [place];  Service: Endoscopy;;  . URETEROSCOPY Left 10/05/2016   Procedure: URETEROSCOPY;  Surgeon: Nickie Retort, MD;  Location: [facility];  Service: Urology;  Laterality: Left;  . URETEROSCOPY WITH HOLMIUM LASER LITHOTRIPSY Left 10/26/2016   Procedure: URETEROSCOPY WITH HOLMIUM LASER LITHOTRIPSY;  Surgeon: Nickie Retort, MD;  Location: [facility];  Service: Urology;  Laterality: Left;  [facility] VASECTOMY      Family History  Problem Relation Age of Onset  . Kidney failure  Mother   . Cancer Mother   . Cancer Father        lung  . Prostate cancer Neg Hx     Social History   Tobacco Use  . Smoking status: Former Smoker    Packs/day: 1.00    Years: 20.00    Pack years: 20.00    Types: Cigarettes    Quit date: 01/19/1979    Years since quitting: 41.4  . Smokeless tobacco: Former Systems developer    Types: Chew  Substance Use Topics  . Alcohol use: Yes    Alcohol/week: 0.0 standard drinks    Comment: Occasional Wine     Current Outpatient Medications:  .  albuterol (VENTOLIN HFA) 108 (90 Base) MCG/ACT inhaler, INHALE 2 PUFFS INTO THE LUNGS EVERY 4 (FOUR) HOURS AS NEEDED FOR WHEEZING OR SHORTNESS OF BREATH., Disp: 18 g, Rfl: 2 .  ascorbic acid (VITAMIN C) 250 MG tablet, Take by mouth., Disp: , Rfl:  .  atorvastatin (LIPITOR) 20 MG tablet, Take 1 tablet (20 mg total) by mouth daily. TAKE 1 TABLET BY MOUTH EVERYDAY AT BEDTIME, Disp: 90 tablet, Rfl: 1 .  budesonide-formoterol (SYMBICORT) 160-4.5 MCG/ACT inhaler, 2 puffs BID, Disp: 3 Inhaler, Rfl: 3 .  finasteride (PROSCAR) 5 MG tablet, Take 1 tablet (5 mg total) by mouth daily., Disp: 90 tablet, Rfl: 3 .  FLUoxetine (PROZAC) 20 MG capsule, TAKE 1 CAPSULE BY MOUTH EVERY DAY, Disp: 90 capsule, Rfl: 3 .  fluticasone (FLONASE) 50 MCG/ACT nasal spray, SMARTSIG:2 Spray(s) Both Nares Daily PRN, Disp: , Rfl:  .  Multiple Vitamins-Minerals (MULTI ADULT GUMMIES) CHEW, Chew 1 tablet by mouth daily., Disp: , Rfl:  .  olmesartan (BENICAR) 40 MG tablet, Take 1 tablet (40 mg total) by mouth daily., Disp: 90 tablet, Rfl: 1 .  traZODone (DESYREL) 50 MG tablet, Take 1 tablet (50 mg total) by mouth at bedtime as needed for sleep., Disp: 90 tablet, Rfl: 3 .  aspirin EC 81 MG tablet, Take 1 tablet (81 mg total) by mouth daily., Disp: 30 tablet, Rfl: 0 .  metoprolol succinate (TOPROL-XL) 25 MG 24 hr tablet, Take 1 tablet (25 mg total) by mouth daily., Disp: 90 tablet, Rfl: 0  Allergies  Allergen Reactions  . Other Swelling    Lips  swell - Potatoes, Kiwi, carrots, celery (raw)  general anesthesia - nausea     I personally reviewed active problem list, medication list, allergies, family history, social history, health maintenance, notes from last encounter with the patient/caregiver today.   ROS  Constitutional: Negative for fever or weight change.  Respiratory: Negative for cough and shortness of breath.   Cardiovascular: Negative for chest pain or palpitations.  Gastrointestinal: Negative for abdominal pain, no bowel changes.  Musculoskeletal: Negative for gait problem or joint swelling.  Skin: Negative for rash.  Neurological: Negative for dizziness or headache.  No other specific complaints in a complete review of systems (except as listed in HPI above).  Objective  Vitals:   06/21/20 1507  BP: (!) 148/88  Pulse: 84  Resp: 20  Temp: 98 F (36.7 C)  TempSrc: Oral  SpO2: 95%  Weight: 234 lb 4.8 oz (106.3 kg)  Height: 5\' 7"  (1.702 m)    Body mass index is 36.7 kg/m.  Physical Exam  Constitutional: Patient appears well-developed and well-nourished. Obese  No distress.  HEENT: head atraumatic, normocephalic, pupils equal and reactive to light,  neck supple Cardiovascular: Normal rate, regular rhythm and normal heart sounds.  No murmur heard. No BLE edema. Pulmonary/Chest: Effort normal and breath sounds normal. No respiratory distress. Abdominal: Soft.  There is no tenderness. Skin: senile purpura  Psychiatric: Patient has a normal mood and affect. behavior is normal. Judgment and thought content normal.  PHQ2/9: Depression screen [facility] 2/9 06/21/2020 06/21/2020 01/19/2020 09/21/2019 08/19/2019  Decreased Interest 0 0 0 0 0  Down, Depressed, Hopeless 0 0 0 0 0  PHQ - 2 Score 0 0 0 0 0  Altered sleeping 1 - 1 1 1   Tired, decreased energy 1 - 0 0 1  Change in appetite 0 - 0 0 0  Feeling bad or failure about yourself  0 - 0 0 0  Trouble concentrating 0 - 0 0 0  Moving slowly or fidgety/restless 0  - 0 0 0  Suicidal thoughts 0 - 0 0 0  PHQ-9 Score 2 - 1 1 2   Difficult doing work/chores Not difficult at all - Not difficult at all Not difficult at all Not difficult at all  Some recent data might be hidden    phq 9 is negative   Fall Risk: Fall Risk  06/21/2020 01/19/2020 09/21/2019 08/19/2019 02/10/2019  Falls in the past year? 0 0 0 0 0  Number falls in past yr: 0 0 0 0 0  Injury with Fall? 0 0 0 0 0      Functional Status Survey: Is the patient deaf or have difficulty hearing?: No Does the patient have difficulty seeing, even when wearing glasses/contacts?: No Does the patient have difficulty concentrating, remembering, or making decisions?: No Does the patient have difficulty dressing or bathing?: No Does the patient have difficulty doing errands alone such as visiting a doctor's office or shopping?: No    Assessment & Plan  1. Essential hypertension  - olmesartan (BENICAR) 40 MG tablet; Take 1 tablet (40 mg total) by mouth daily.  Dispense: 90 tablet; Refill: 1  2. Coronary artery disease involving native coronary artery of native heart with angina pectoris (HCC)  Adjusting dose of Atorvastatin to 20 mg   3. COPD with asthma ([place])   4. BPH with obstruction/lower urinary tract symptoms   5. Stable angina pectoris (HCC)  - aspirin EC 81 MG tablet; Take 1 tablet (81 mg total) by mouth daily.  Dispense: 30 tablet; Refill: 0 - metoprolol succinate (TOPROL-XL) 25 MG 24 hr tablet; Take 1 tablet (25 mg total) by mouth daily.  Dispense: 90 tablet; Refill: 0  6. Insomnia, unspecified type   7. Mixed hyperlipidemia  - atorvastatin (LIPITOR) 20 MG tablet; Take 1 tablet (20 mg total) by mouth daily. TAKE 1 TABLET BY MOUTH EVERYDAY AT BEDTIME  Dispense: 90 tablet; Refill: 1  8. Essential hypertension  - olmesartan (BENICAR) 40 MG tablet; Take 1 tablet (40 mg total) by mouth daily.  Dispense: 90 tablet; Refill: 1  9. PTSD (post-traumatic  stress disorder)  Continue  Prozac   10. Senile purpura ([place])   11. Skin lesion of right arm  - Surgical pathology   Consent signed: YES  Procedure: Skin Mass Removal Location: right  forarm Equipment used: derma-blade, high temperature cautery Anesthesia: 1% Lidocaine w/o Epinephrine  Cleaned and prepped: Betadine  After consent signed, are of skin prepped with betadine. Lidocaine w/o epinephrine injected into skin underneath skin mass. After properly numbed sterile equipment used to remove tag.  Specimen sent for pathology analysis. Instructed on proper care to allow for proper healing. F/U for nursing visit if needed.

## 2020-06-21 ENCOUNTER — Other Ambulatory Visit (HOSPITAL_COMMUNITY)
Admission: RE | Admit: 2020-06-21 | Discharge: 2020-06-21 | Disposition: A | Discharge status: Home / Self Care | Payer: Federal, State, Local not specified - PPO | Source: Ambulatory Visit | Attending: Family Medicine | Admitting: Family Medicine

## 2020-06-21 ENCOUNTER — Encounter: Payer: Self-pay | Admitting: Family Medicine

## 2020-06-21 ENCOUNTER — Ambulatory Visit: Payer: Federal, State, Local not specified - PPO | Admitting: Family Medicine

## 2020-06-21 ENCOUNTER — Other Ambulatory Visit: Payer: Self-pay

## 2020-06-21 VITALS — BP 136/82 | HR 84 | Temp 98.0°F | Resp 20 | Ht 67.0 in | Wt 234.3 lb

## 2020-06-21 DIAGNOSIS — J449 Chronic obstructive pulmonary disease, unspecified: Secondary | ICD-10-CM

## 2020-06-21 DIAGNOSIS — E782 Mixed hyperlipidemia: Secondary | ICD-10-CM

## 2020-06-21 DIAGNOSIS — I25119 Atherosclerotic heart disease of native coronary artery with unspecified angina pectoris: Secondary | ICD-10-CM | POA: Diagnosis not present

## 2020-06-21 DIAGNOSIS — D692 Other nonthrombocytopenic purpura: Secondary | ICD-10-CM | POA: Insufficient documentation

## 2020-06-21 DIAGNOSIS — N138 Other obstructive and reflux uropathy: Secondary | ICD-10-CM

## 2020-06-21 DIAGNOSIS — L989 Disorder of the skin and subcutaneous tissue, unspecified: Secondary | ICD-10-CM | POA: Diagnosis present

## 2020-06-21 DIAGNOSIS — F4310 Post-traumatic stress disorder, unspecified: Secondary | ICD-10-CM

## 2020-06-21 DIAGNOSIS — I10 Essential (primary) hypertension: Secondary | ICD-10-CM

## 2020-06-21 DIAGNOSIS — N401 Enlarged prostate with lower urinary tract symptoms: Secondary | ICD-10-CM

## 2020-06-21 DIAGNOSIS — I2089 Other forms of angina pectoris: Secondary | ICD-10-CM

## 2020-06-21 DIAGNOSIS — G4700 Insomnia, unspecified: Secondary | ICD-10-CM

## 2020-06-21 DIAGNOSIS — I208 Other forms of angina pectoris: Secondary | ICD-10-CM

## 2020-06-21 DIAGNOSIS — J4489 Other specified chronic obstructive pulmonary disease: Secondary | ICD-10-CM

## 2020-06-21 MED ORDER — ATORVASTATIN CALCIUM 20 MG PO TABS: 20.0000 mg | ORAL_TABLET | Freq: Every day | ORAL | 1 refills | Status: DC

## 2020-06-21 MED ORDER — ASPIRIN EC 81 MG PO TBEC: 81.0000 mg | DELAYED_RELEASE_TABLET | Freq: Every day | ORAL | 0 refills | Status: DC

## 2020-06-21 MED ORDER — OLMESARTAN MEDOXOMIL 40 MG PO TABS: 40.0000 mg | ORAL_TABLET | Freq: Every day | ORAL | 1 refills | Status: DC

## 2020-06-21 MED ORDER — METOPROLOL SUCCINATE ER 25 MG PO TB24: 25.0000 mg | ORAL_TABLET | Freq: Every day | ORAL | 0 refills | Status: DC

## 2020-06-23 LAB — SURGICAL PATHOLOGY

## 2020-07-19 ENCOUNTER — Other Ambulatory Visit: Payer: Self-pay | Admitting: Family Medicine

## 2020-07-19 DIAGNOSIS — I208 Other forms of angina pectoris: Secondary | ICD-10-CM

## 2020-08-16 ENCOUNTER — Other Ambulatory Visit: Payer: Self-pay | Admitting: Family Medicine

## 2020-08-16 DIAGNOSIS — I208 Other forms of angina pectoris: Secondary | ICD-10-CM

## 2020-08-18 ENCOUNTER — Other Ambulatory Visit: Payer: Self-pay | Admitting: Family Medicine

## 2020-08-18 DIAGNOSIS — J449 Chronic obstructive pulmonary disease, unspecified: Secondary | ICD-10-CM

## 2020-09-18 ENCOUNTER — Other Ambulatory Visit: Payer: Self-pay | Admitting: Family Medicine

## 2020-09-18 DIAGNOSIS — I208 Other forms of angina pectoris: Secondary | ICD-10-CM

## 2020-10-17 NOTE — Progress Notes (Signed)
Name: Willie Crane   MRN: [id number]    DOB: 1946/06/17   Date:10/19/2020       Progress Note  Subjective  Chief Complaint  Follow up   HPI   Angina Pectoris: he states possible MI after surgery at the [place], he was seen by Dr. Nehemiah Massed back in 2019, I cannot see the studies done at the time. He has been on medical management t. We adjusted dose of Atorvastatin, he is also taking Metoprolol and Benicar and also aspirin 81 mg. He denies any chest pain. Discussed follow up with cardiologist but he does not seem interested.   COPD/ Asthma: he has been using Symbicort , he has a morning cough but not daily. He usually sees Dr. Mortimer Fries, taking Symbicort BID, he uses Ventolin multiple times a week, he states symptoms worse this time of the year due to increase in pollen. , he takes otc allergy medications prn   Morbid Obesity: BMI is stable , he has not been back to the gym to swim. His BMI is above 35 with co-morbidities such as CAD, dyslipidemia and HTN   Senile purpura: on arms , he is on aspirin, reassurance given again to the patient   BPH: sees Urologist and we will check PSA today   Insomnia: he goes to bed late but wakes up around 10 am getting about 8 hours of sleep with Trazodone 100 mg per night. No side effects of medications.   PTSD: he takes prozac and states it works well for him, gets jumpy with loud noises , [facility] but not [facility]   He could not remember his medications,his wife came in and did medication reconciliation with [facility] CMA  Patient Active Problem List   Diagnosis Date Noted  . COPD with asthma ([place]) 06/21/2020  . Senile purpura ([place]) 06/21/2020  . Insomnia 01/20/2020  . PTSD (post-traumatic stress disorder) 02/10/2019  . Morbid obesity ([place]) 06/30/2018  . Coronary artery disease 06/30/2018  . Essential hypertension 05/19/2018  . Stable angina pectoris ([place]) 05/19/2018  . Stage 2 moderate COPD by GOLD classification ([place]) 02/12/2017  .  History of acute myocardial infarction 09/27/2016  . Prolonged Q-T interval on ECG 09/27/2016  . Moderate persistent asthma 09/28/2015  . 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 Hydrocele, bilateral 07/12/2015  . Hyperlipidemia     Past Surgical History:  Procedure Laterality Date  . BACK SURGERY    . CARDIAC CATHETERIZATION  03/2009   [facility]: No significant coronary artery disease  . [facility]ATION  [facility]   [facility]  . CARPAL TUNNEL RELEASE Right   . CHOLECYSTECTOMY    . COLONOSCOPY    . COLONOSCOPY WITH PROPOFOL N/A 10/31/2015   Procedure: COLONOSCOPY WITH PROPOFOL;  Surgeon: Lucilla Lame, MD;  Location: [place];  Service: Endoscopy;  Laterality: N/A;  . CYST EXCISION    . CYSTOSCOPY W/ URETERAL STENT PLACEMENT Left 10/26/2016   Procedure: CYSTOSCOPY WITH STENT REPLACEMENT;  Surgeon: Nickie Retort, MD;  Location: [facility];  Service: Urology;  Laterality: Left;  . CYSTOSCOPY WITH STENT PLACEMENT Left 10/05/2016   Procedure: CYSTOSCOPY WITH STENT PLACEMENT;  Surgeon: Nickie Retort, MD;  Location: [facility];  Service: Urology;  Laterality: Left;  . HERNIA REPAIR    . INGUINAL HERNIA REPAIR Right   . INGUINAL HERNIA REPAIR Left 01/26/2016   Procedure: HERNIA REPAIR INGUINAL ADULT;  Surgeon: Christene Lye, MD;  Location: [facility];  Service: General;  Laterality: Left;  . KNEE ARTHROSCOPY    . LUMBAR LAMINECTOMY    . POLYPECTOMY  10/31/2015   Procedure: POLYPECTOMY;  Surgeon: Lucilla Lame, MD;  Location: [place];  Service: Endoscopy;;  . URETEROSCOPY Left 10/05/2016   Procedure: URETEROSCOPY;  Surgeon: Nickie Retort, MD;  Location: [facility];  Service: Urology;  Laterality: Left;  . URETEROSCOPY WITH HOLMIUM LASER LITHOTRIPSY Left 10/26/2016   Procedure: URETEROSCOPY WITH HOLMIUM LASER LITHOTRIPSY;  Surgeon: Nickie Retort, MD;  Location: [facility];  Service: Urology;  Laterality: Left;  [facility] VASECTOMY      Family  History  Problem Relation Age of Onset  . Kidney failure Mother   . Cancer Mother   . Cancer Father        lung  . Prostate cancer Neg Hx     Social History   Tobacco Use  . Smoking status: Former Smoker    Packs/day: 1.00    Years: 20.00    Pack years: 20.00    Types: Cigarettes    Quit date: 01/19/1979    Years since quitting: 41.7  . Smokeless tobacco: Former Systems developer    Types: Chew  Substance Use Topics  . Alcohol use: Yes    Alcohol/week: 0.0 standard drinks    Comment: Occasional Wine     Current Outpatient Medications:  .  albuterol (VENTOLIN HFA) 108 (90 Base) MCG/ACT inhaler, INHALE 2 PUFFS INTO THE LUNGS EVERY 4 (FOUR) HOURS AS NEEDED FOR WHEEZING OR SHORTNESS OF BREATH., Disp: 18 g, Rfl: 2 .  ascorbic acid (VITAMIN C) 250 MG tablet, Take by mouth., Disp: , Rfl:  .  aspirin 81 MG EC tablet, TAKE 1 TABLET BY MOUTH EVERY DAY, Disp: 90 tablet, Rfl: 1 .  atorvastatin (LIPITOR) 20 MG tablet, Take 1 tablet (20 mg total) by mouth daily. TAKE 1 TABLET BY MOUTH EVERYDAY AT BEDTIME, Disp: 90 tablet, Rfl: 1 .  budesonide-formoterol (SYMBICORT) 160-4.5 MCG/ACT inhaler, USE 2 INHALATIONS ORALLY   TWICE DAILY FOR CHRONIC    OBSTRUCTIVE PULMONARY      DISEASE, Disp: 30.6 g, Rfl: 3 .  finasteride (PROSCAR) 5 MG tablet, Take 1 tablet (5 mg total) by mouth daily., Disp: 90 tablet, Rfl: 3 .  FLUoxetine (PROZAC) 20 MG capsule, TAKE 1 CAPSULE BY MOUTH EVERY DAY, Disp: 90 capsule, Rfl: 3 .  fluticasone (FLONASE) 50 MCG/ACT nasal spray, SMARTSIG:2 Spray(s) Both Nares Daily PRN, Disp: , Rfl:  .  metoprolol succinate (TOPROL-XL) 25 MG 24 hr tablet, TAKE 1 TABLET BY MOUTH EVERY DAY, Disp: 90 tablet, Rfl: 0 .  Multiple Vitamins-Minerals (MULTI ADULT GUMMIES) CHEW, Chew 1 tablet by mouth daily., Disp: , Rfl:  .  olmesartan (BENICAR) 40 MG tablet, Take 1 tablet (40 mg total) by mouth daily., Disp: 90 tablet, Rfl: 1 .  traZODone (DESYREL) 50 MG tablet, Take 1 tablet (50 mg total) by mouth at bedtime  as needed for sleep., Disp: 90 tablet, Rfl: 3  Allergies  Allergen Reactions  . Other Swelling    Lips swell - Potatoes, Kiwi, carrots, celery (raw)  general anesthesia - nausea     I personally reviewed active problem list, medication list, allergies, family history, social history, health maintenance, notes from last encounter with the patient/caregiver today.   ROS  Constitutional: Negative for fever or weight change.  Respiratory: Negative for cough and shortness of breath.   Cardiovascular: Negative for chest pain or palpitations.  Gastrointestinal: Negative for abdominal pain, no bowel changes.  Musculoskeletal: Negative  for gait problem or joint swelling.  Skin: Negative for rash.  Neurological: Negative for dizziness or headache.  No other specific complaints in a complete review of systems (except as listed in HPI above).  Objective  Vitals:   10/19/20 1504  BP: 140/86  Pulse: 80  Resp: 16  Temp: 97.6 F (36.4 C)  TempSrc: Oral  SpO2: 99%  Weight: 233 lb (105.7 kg)  Height: 5\' 7"  (1.702 m)    Body mass index is 36.49 kg/m.  Physical Exam  Constitutional: Patient appears well-developed and well-nourished. Obese  No distress.  HEENT: head atraumatic, normocephalic, pupils equal and reactive to light, neck supple Cardiovascular: Normal rate, regular rhythm and normal heart sounds.  No murmur heard. No BLE edema. Pulmonary/Chest: Effort normal and breath sounds normal. No respiratory distress. Abdominal: Soft.  There is no tenderness. Psychiatric: Patient has a normal mood and affect. behavior is normal. Judgment and thought content normal.   PHQ2/9: Depression screen [facility] 2/9 10/19/2020 06/21/2020 06/21/2020 01/19/2020 09/21/2019  Decreased Interest 0 0 0 0 0  Down, Depressed, Hopeless 0 0 0 0 0  PHQ - 2 Score 0 0 0 0 0  Altered sleeping - 1 - 1 1  Tired, decreased energy - 1 - 0 0  Change in appetite - 0 - 0 0  Feeling bad or failure about yourself  - 0  - 0 0  Trouble concentrating - 0 - 0 0  Moving slowly or fidgety/restless - 0 - 0 0  Suicidal thoughts - 0 - 0 0  PHQ-9 Score - 2 - 1 1  Difficult doing work/chores - Not difficult at all - Not difficult at all Not difficult at all  Some recent data might be hidden    phq 9 is negative   Fall Risk: Fall Risk  10/19/2020 06/21/2020 01/19/2020 09/21/2019 08/19/2019  Falls in the past year? 0 0 0 0 0  Number falls in past yr: 0 0 0 0 0  Injury with Fall? 0 0 0 0 0    Functional Status Survey: Is the patient deaf or have difficulty hearing?: No Does the patient have difficulty seeing, even when wearing glasses/contacts?: No Does the patient have difficulty concentrating, remembering, or making decisions?: No Does the patient have difficulty walking or climbing stairs?: Yes Does the patient have difficulty dressing or bathing?: No Does the patient have difficulty doing errands alone such as visiting a doctor's office or shopping?: No    Assessment & Plan  1. Coronary artery disease involving native coronary artery of native heart with angina pectoris ([place])  - Lipid panel  2. COPD with asthma ([place])   3. Stable angina pectoris (HCC)  - metoprolol succinate (TOPROL-XL) 25 MG 24 hr tablet; Take 1 tablet (25 mg total) by mouth daily.  Dispense: 90 tablet; Refill: 0  4. Essential hypertension  - COMPLETE METABOLIC PANEL WITH GFR - CBC with Differential/Platelet - olmesartan (BENICAR) 40 MG tablet; Take 1 tablet (40 mg total) by mouth daily.  Dispense: 90 tablet; Refill: 1  5. PTSD (post-traumatic stress disorder)   6. Senile purpura (HCC) \ Stable   7. Morbid obesity ([place])   Discussed with the patient the risk posed by an increased BMI. Discussed importance of portion control, calorie counting and at least 150 minutes of physical activity weekly. Avoid sweet beverages and drink more water. Eat at least 6 servings of fruit and vegetables daily   8. BPH with  obstruction/lower urinary tract symptoms  -  PSA  9. PSA elevation   10. Insomnia, unspecified type  - traZODone (DESYREL) 100 MG tablet; Take 1 tablet (100 mg total) by mouth at bedtime as needed for sleep. New dose take only one pill  Dispense: 90 tablet; Refill: 1

## 2020-10-19 ENCOUNTER — Other Ambulatory Visit: Payer: Self-pay

## 2020-10-19 ENCOUNTER — Encounter: Payer: Self-pay | Admitting: Family Medicine

## 2020-10-19 ENCOUNTER — Ambulatory Visit: Payer: Federal, State, Local not specified - PPO | Admitting: Family Medicine

## 2020-10-19 VITALS — BP 136/84 | HR 80 | Temp 97.6°F | Resp 16 | Ht 67.0 in | Wt 233.0 lb

## 2020-10-19 DIAGNOSIS — F4310 Post-traumatic stress disorder, unspecified: Secondary | ICD-10-CM

## 2020-10-19 DIAGNOSIS — I10 Essential (primary) hypertension: Secondary | ICD-10-CM

## 2020-10-19 DIAGNOSIS — J449 Chronic obstructive pulmonary disease, unspecified: Secondary | ICD-10-CM

## 2020-10-19 DIAGNOSIS — N138 Other obstructive and reflux uropathy: Secondary | ICD-10-CM

## 2020-10-19 DIAGNOSIS — I25119 Atherosclerotic heart disease of native coronary artery with unspecified angina pectoris: Secondary | ICD-10-CM | POA: Diagnosis not present

## 2020-10-19 DIAGNOSIS — R972 Elevated prostate specific antigen [PSA]: Secondary | ICD-10-CM

## 2020-10-19 DIAGNOSIS — G4700 Insomnia, unspecified: Secondary | ICD-10-CM

## 2020-10-19 DIAGNOSIS — I208 Other forms of angina pectoris: Secondary | ICD-10-CM | POA: Diagnosis not present

## 2020-10-19 DIAGNOSIS — N401 Enlarged prostate with lower urinary tract symptoms: Secondary | ICD-10-CM

## 2020-10-19 DIAGNOSIS — D692 Other nonthrombocytopenic purpura: Secondary | ICD-10-CM

## 2020-10-19 MED ORDER — METOPROLOL SUCCINATE ER 25 MG PO TB24: 25.0000 mg | ORAL_TABLET | Freq: Every day | ORAL | 0 refills | Status: DC

## 2020-10-19 MED ORDER — OLMESARTAN MEDOXOMIL 40 MG PO TABS: 40.0000 mg | ORAL_TABLET | Freq: Every day | ORAL | 1 refills | Status: DC

## 2020-10-19 MED ORDER — TRAZODONE HCL 100 MG PO TABS: 100.0000 mg | ORAL_TABLET | Freq: Every evening | ORAL | 1 refills | Status: DC | PRN

## 2020-10-20 LAB — CBC WITH DIFFERENTIAL/PLATELET
Absolute Monocytes: 615 cells/uL (ref 200–950)
Basophils Absolute: 53 cells/uL (ref 0–200)
Basophils Relative: 0.7 %
Eosinophils Absolute: 308 cells/uL (ref 15–500)
Eosinophils Relative: 4.1 %
HCT: 47.6 % (ref 38.5–50.0)
Hemoglobin: 15.8 g/dL (ref 13.2–17.1)
Lymphs Abs: 1590 cells/uL (ref 850–3900)
MCH: 30 pg (ref 27.0–33.0)
MCHC: 33.2 g/dL (ref 32.0–36.0)
MCV: 90.3 fL (ref 80.0–100.0)
MPV: 10.1 fL (ref 7.5–12.5)
Monocytes Relative: 8.2 %
Neutro Abs: 4935 cells/uL (ref 1500–7800)
Neutrophils Relative %: 65.8 %
Platelets: 193 10*3/uL (ref 140–400)
RBC: 5.27 10*6/uL (ref 4.20–5.80)
RDW: 12 % (ref 11.0–15.0)
Total Lymphocyte: 21.2 %
WBC: 7.5 10*3/uL (ref 3.8–10.8)

## 2020-10-20 LAB — LIPID PANEL
Cholesterol: 128 mg/dL (ref <200)
HDL: 52 mg/dL (ref >=40)
LDL Cholesterol (Calc): 53 mg/dL (calc)
Non-HDL Cholesterol (Calc): 76 mg/dL (calc) (ref <130)
Total CHOL/HDL Ratio: 2.5 (calc) (ref <5.0)
Triglycerides: 144 mg/dL (ref <150)

## 2020-10-20 LAB — COMPLETE METABOLIC PANEL WITH GFR
AG Ratio: 1.7 (calc) (ref 1.0–2.5)
ALT: 31 U/L (ref 9–46)
AST: 29 U/L (ref 10–35)
Albumin: 4 g/dL (ref 3.6–5.1)
Alkaline phosphatase (APISO): 73 U/L (ref 35–144)
BUN: 14 mg/dL (ref 7–25)
CO2: 30 mmol/L (ref 20–32)
Calcium: 9.5 mg/dL (ref 8.6–10.3)
Chloride: 102 mmol/L (ref 98–110)
Creat: 1.07 mg/dL (ref 0.70–1.18)
GFR, Est African American: 78 mL/min/{1.73_m2} (ref >=60)
GFR, Est Non African American: 68 mL/min/{1.73_m2} (ref >=60)
Globulin: 2.4 g/dL (calc) (ref 1.9–3.7)
Glucose, Bld: 89 mg/dL (ref 65–99)
Potassium: 4.4 mmol/L (ref 3.5–5.3)
Sodium: 141 mmol/L (ref 135–146)
Total Bilirubin: 0.7 mg/dL (ref 0.2–1.2)
Total Protein: 6.4 g/dL (ref 6.1–8.1)

## 2020-10-20 LAB — PSA: PSA: 2.66 ng/mL (ref <=4.0)

## 2020-12-20 ENCOUNTER — Other Ambulatory Visit: Payer: Self-pay | Admitting: Family Medicine

## 2020-12-20 DIAGNOSIS — E782 Mixed hyperlipidemia: Secondary | ICD-10-CM

## 2021-01-22 ENCOUNTER — Other Ambulatory Visit: Payer: Self-pay | Admitting: Urology

## 2021-01-22 DIAGNOSIS — N138 Other obstructive and reflux uropathy: Secondary | ICD-10-CM

## 2021-01-24 ENCOUNTER — Ambulatory Visit: Payer: Self-pay | Admitting: Urology

## 2021-02-17 NOTE — Progress Notes (Deleted)
Name: Willie Crane   MRN: [id number]    DOB: Dec 17, 1945   Date:02/17/2021       Progress Note  Subjective  Chief Complaint  Follow Up  HPI  Angina Pectoris: he states possible MI after surgery at the [place], he was seen by Dr. Nehemiah Massed back in 2019, I cannot see the studies done at the time. He has been on medical management t. We adjusted dose of Atorvastatin, he is also taking Metoprolol and Benicar and also aspirin 81 mg. He denies any chest pain. Discussed follow up with cardiologist but he does not seem interested.   COPD/ Asthma: he has been using Symbicort , he has a morning cough but not daily. He usually sees Dr. Mortimer Fries, taking Symbicort BID, he uses Ventolin multiple times a week, he states symptoms worse this time of the year due to increase in pollen. , he takes otc allergy medications prn   Morbid Obesity: BMI is stable , he has not been back to the gym to swim. His BMI is above 35 with co-morbidities such as CAD, dyslipidemia and HTN   Senile purpura: on arms , he is on aspirin, reassurance given again to the patient   BPH: sees Urologist and we will check PSA today   Insomnia: he goes to bed late but wakes up around 10 am getting about 8 hours of sleep with Trazodone 100 mg per night. No side effects of medications.   PTSD: he takes prozac and states it works well for him, gets jumpy with loud noises , [facility] but not [facility]   Patient Active Problem List   Diagnosis Date Noted   COPD with asthma ([place]) 06/21/2020   Senile purpura ([place]) 06/21/2020   Insomnia 01/20/2020   PTSD (post-traumatic stress disorder) 02/10/2019   Morbid obesity ([place]) 06/30/2018   Coronary artery disease 06/30/2018   Essential hypertension 05/19/2018   Stable angina pectoris ([place]) 05/19/2018   Stage 2 moderate COPD by GOLD classification ([place]) 02/12/2017   History of acute myocardial infarction 09/27/2016   Prolonged Q-T interval on ECG 09/27/2016   Moderate persistent  asthma 09/28/2015   BPH with obstruction/lower urinary tract symptoms 07/12/2015   Hydrocele, bilateral 07/12/2015   Hyperlipidemia     Past Surgical History:  Procedure Laterality Date   BACK SURGERY     CARDIAC [facility]HETERIZATION  03/2009   [facility]: No significant coronary artery disease   [facility]   CARPAL TUNNEL RELEASE Right    CHOLECYSTECTOMY     COLONOSCOPY     COLONOSCOPY WITH PROPOFOL N/A 10/31/2015   Procedure: COLONOSCOPY WITH PROPOFOL;  Surgeon: Lucilla Lame, MD;  Location: [place];  Service: Endoscopy;  Laterality: N/A;   CYST EXCISION     CYSTOSCOPY W/ URETERAL STENT PLACEMENT Left 10/26/2016   Procedure: CYSTOSCOPY WITH STENT REPLACEMENT;  Surgeon: Nickie Retort, MD;  Location: [facility];  Service: Urology;  Laterality: Left;   CYSTOSCOPY WITH STENT PLACEMENT Left 10/05/2016   Procedure: CYSTOSCOPY WITH STENT PLACEMENT;  Surgeon: Nickie Retort, MD;  Location: [facility];  Service: Urology;  Laterality: Left;   HERNIA REPAIR     INGUINAL HERNIA REPAIR Right    INGUINAL HERNIA REPAIR Left 01/26/2016   Procedure: HERNIA REPAIR INGUINAL ADULT;  Surgeon: Christene Lye, MD;  Location: [facility];  Service: General;  Laterality: Left;   KNEE ARTHROSCOPY     LUMBAR LAMINECTOMY     POLYPECTOMY  10/31/2015   Procedure: POLYPECTOMY;  Surgeon: Lucilla Lame, MD;  Location: [place];  Service: Endoscopy;;   URETEROSCOPY Left 10/05/2016   Procedure: URETEROSCOPY;  Surgeon: Nickie Retort, MD;  Location: [facility];  Service: Urology;  Laterality: Left;   URETEROSCOPY WITH HOLMIUM LASER LITHOTRIPSY Left 10/26/2016   Procedure: URETEROSCOPY WITH HOLMIUM LASER LITHOTRIPSY;  Surgeon: Nickie Retort, MD;  Location: [facility];  Service: Urology;  Laterality: Left;   VASECTOMY      Family History  Problem Relation Age of Onset   Kidney failure Mother    Cancer Mother    Cancer Father        lung   Prostate cancer Neg Hx      Social History   Tobacco Use   Smoking status: Former    Packs/day: 1.00    Years: 20.00    Pack years: 20.00    Types: Cigarettes    Quit date: 01/19/1979    Years since quitting: 42.1   Smokeless tobacco: Former    Types: Chew  Substance Use Topics   Alcohol use: Yes    Alcohol/week: 0.0 standard drinks    Comment: Occasional Wine     Current Outpatient Medications:    albuterol (VENTOLIN HFA) 108 (90 Base) MCG/ACT inhaler, INHALE 2 PUFFS INTO THE LUNGS EVERY 4 (FOUR) HOURS AS NEEDED FOR WHEEZING OR SHORTNESS OF BREATH., Disp: 18 g, Rfl: 2   ascorbic acid (VITAMIN C) 250 MG tablet, Take by mouth., Disp: , Rfl:    aspirin 81 MG EC tablet, TAKE 1 TABLET BY MOUTH EVERY DAY, Disp: 90 tablet, Rfl: 1   atorvastatin (LIPITOR) 20 MG tablet, TAKE 1 TABLET BY MOUTH EVERYDAY AT BEDTIME, Disp: 90 tablet, Rfl: 0   budesonide-formoterol (SYMBICORT) 160-4.5 MCG/ACT inhaler, USE 2 INHALATIONS ORALLY   TWICE DAILY FOR CHRONIC    OBSTRUCTIVE PULMONARY      DISEASE, Disp: 30.6 g, Rfl: 3   finasteride (PROSCAR) 5 MG tablet, Take 1 tablet (5 mg total) by mouth daily., Disp: 90 tablet, Rfl: 3   FLUoxetine (PROZAC) 20 MG capsule, TAKE 1 CAPSULE BY MOUTH EVERY DAY, Disp: 90 capsule, Rfl: 3   fluticasone (FLONASE) 50 MCG/ACT nasal spray, SMARTSIG:2 Spray(s) Both Nares Daily PRN, Disp: , Rfl:    metoprolol succinate (TOPROL-XL) 25 MG 24 hr tablet, Take 1 tablet (25 mg total) by mouth daily., Disp: 90 tablet, Rfl: 0   Multiple Vitamins-Minerals (MULTI ADULT GUMMIES) CHEW, Chew 1 tablet by mouth daily., Disp: , Rfl:    olmesartan (BENICAR) 40 MG tablet, Take 1 tablet (40 mg total) by mouth daily., Disp: 90 tablet, Rfl: 1   traZODone (DESYREL) 100 MG tablet, Take 1 tablet (100 mg total) by mouth at bedtime as needed for sleep. New dose take only one pill, Disp: 90 tablet, Rfl: 1  Allergies  Allergen Reactions   Other Swelling    Lips swell - Potatoes, Kiwi, carrots, celery (raw)  general anesthesia -  nausea     I personally reviewed {Reviewed:14835} with the patient/caregiver today.   ROS  ***  Objective  There were no vitals filed for this visit.  There is no height or weight on file to calculate BMI.  Physical Exam ***  No results found for this or any previous visit (from the past 2160 hour(s)).  Diabetic Foot Exam: Diabetic Foot Exam - Simple   No data filed    ***  PHQ2/9: Depression screen [facility] 2/9 10/19/2020 06/21/2020 06/21/2020 01/19/2020 09/21/2019  Decreased Interest 0 0 0 0 0  Down, Depressed, Hopeless 0 0 0 0 0  PHQ - 2 Score 0 0 0 0 0  Altered sleeping - 1 - 1 1  Tired, decreased energy - 1 - 0 0  Change in appetite - 0 - 0 0  Feeling bad or failure about yourself  - 0 - 0 0  Trouble concentrating - 0 - 0 0  Moving slowly or fidgety/restless - 0 - 0 0  Suicidal thoughts - 0 - 0 0  PHQ-9 Score - 2 - 1 1  Difficult doing work/chores - Not difficult at all - Not difficult at all Not difficult at all  Some recent data might be hidden    phq 9 is {gen pos [id number]:091980} ***  Fall Risk: Fall Risk  10/19/2020 06/21/2020 01/19/2020 09/21/2019 08/19/2019  Falls in the past year? 0 0 0 0 0  Number falls in past yr: 0 0 0 0 0  Injury with Fall? 0 0 0 0 0   ***   Functional Status Survey:   ***   Assessment & Plan  *** There are no diagnoses linked to this encounter.

## 2021-02-20 ENCOUNTER — Ambulatory Visit: Payer: Federal, State, Local not specified - PPO | Admitting: Family Medicine

## 2021-02-21 ENCOUNTER — Other Ambulatory Visit: Payer: Self-pay | Admitting: Family Medicine

## 2021-02-21 DIAGNOSIS — I208 Other forms of angina pectoris: Secondary | ICD-10-CM

## 2021-02-23 ENCOUNTER — Other Ambulatory Visit: Payer: Self-pay | Admitting: Family Medicine

## 2021-02-23 DIAGNOSIS — I208 Other forms of angina pectoris: Secondary | ICD-10-CM

## 2021-02-23 NOTE — Telephone Encounter (Signed)
Requested Prescriptions  Pending Prescriptions Disp Refills  . metoprolol succinate (TOPROL-XL) 25 MG 24 hr tablet [Pharmacy Med Name: METOPROLOL SUCC ER 25 MG TAB] 90 tablet 0    Sig: TAKE 1 TABLET (25 MG TOTAL) BY MOUTH DAILY.     Cardiovascular:  Beta Blockers Passed - 02/23/2021  4:15 AM      Passed - Last BP in normal range    BP Readings from Last 1 Encounters:  10/19/20 136/84         Passed - Last Heart Rate in normal range    Pulse Readings from Last 1 Encounters:  10/19/20 80         Passed - Valid encounter within last 6 months    Recent Outpatient Visits          4 months ago COPD with asthma [facility])   [facility] Steele Sizer, MD   8 months ago Essential hypertension   [facility] Steele Sizer, MD   1 year ago Essential hypertension   [facility] Delsa Grana, PA-C   1 year ago Essential hypertension   [facility] Delsa Grana, PA-C   1 year ago Essential hypertension   [facility] Delsa Grana, [place]      Future Appointments            In 2 weeks Steele Sizer, MD [facility], [facility]

## 2021-03-09 NOTE — Progress Notes (Signed)
Name: Willie Crane   MRN: [id number]    DOB: Apr 01, 1946   Date:03/10/2021       Progress Note  Subjective  Chief Complaint  Follow Up  HPI  Angina Pectoris: he states possible MI after surgery at the [place], he was seen by Dr. Nehemiah Massed back in 2019, I cannot see the studies done at the time. He has been on medical management . He is on statin, ARB and aspirin He is doing well at this time CT chest showed atherosclerosis of aorta back in 2018   COPD/ Asthma: he has been using Symbicort , he has a morning cough but not daily. He usually sees Dr. Mortimer Fries. He is using Ventolin a couple of times a week. He has intermittent wheezing and has some SOB but stable. Quit smoking many years ago   Morbid Obesity: weight is trending down, he is eating one meal a day and snacks at night. His BMI is above 35 with co-morbidities such as CAD, dyslipidemia and HTN   Senile purpura: on arms , he is on aspirin. Unchanged   BPH: sees Urologist and we will check PSA today   BPPV: seen by ENT in the past and had Epley maneuver but still has episodes intermittently when turning to left side in bed  Insomnia: he is going to bed around midnight and falls asleep hours later. Currently on Trazodone but not working , he does stay asleep until noon sometimes He states he does not want to change medication   PTSD: he takes prozac and states it works well for him,, no longer having symptoms with medications, he used to have nightmares about [place] . He does not want to go see another psychiatrist at this time   Patient Active Problem List   Diagnosis Date Noted   COPD with asthma ([place]) 06/21/2020   Senile purpura ([place]) 06/21/2020   Insomnia 01/20/2020   PTSD (post-traumatic stress disorder) 02/10/2019   Morbid obesity ([place]) 06/30/2018   Coronary artery disease 06/30/2018   Essential hypertension 05/19/2018   Stable angina pectoris ([place]) 05/19/2018   Stage 2 moderate COPD by GOLD classification ([place]) 02/12/2017    History of acute myocardial infarction 09/27/2016   Prolonged Q-T interval on ECG 09/27/2016   Moderate persistent asthma 09/28/2015   BPH with obstruction/lower urinary tract symptoms 07/12/2015   Hydrocele, bilateral 07/12/2015   Hyperlipidemia     Past Surgical History:  Procedure Laterality Date   BACK SURGERY     CARDIAC [facility]IZATION  03/2009   [facility]: No significant coronary artery disease   [facility]   CARPAL TUNNEL RELEASE Right    CHOLECYSTECTOMY     COLONOSCOPY     COLONOSCOPY WITH PROPOFOL N/A 10/31/2015   Procedure: COLONOSCOPY WITH PROPOFOL;  Surgeon: Lucilla Lame, MD;  Location: [place];  Service: Endoscopy;  Laterality: N/A;   CYST EXCISION     CYSTOSCOPY W/ URETERAL STENT PLACEMENT Left 10/26/2016   Procedure: CYSTOSCOPY WITH STENT REPLACEMENT;  Surgeon: Nickie Retort, MD;  Location: [facility];  Service: Urology;  Laterality: Left;   CYSTOSCOPY WITH STENT PLACEMENT Left 10/05/2016   Procedure: CYSTOSCOPY WITH STENT PLACEMENT;  Surgeon: Nickie Retort, MD;  Location: [facility];  Service: Urology;  Laterality: Left;   HERNIA REPAIR     INGUINAL HERNIA REPAIR Right    INGUINAL HERNIA REPAIR Left 01/26/2016   Procedure: HERNIA REPAIR INGUINAL ADULT;  Surgeon: Christene Lye, MD;  Location: [facility];  Service: General;  Laterality: Left;   KNEE ARTHROSCOPY     LUMBAR LAMINECTOMY     POLYPECTOMY  10/31/2015   Procedure: POLYPECTOMY;  Surgeon: Lucilla Lame, MD;  Location: [place];  Service: Endoscopy;;   URETEROSCOPY Left 10/05/2016   Procedure: URETEROSCOPY;  Surgeon: Nickie Retort, MD;  Location: [facility];  Service: Urology;  Laterality: Left;   URETEROSCOPY WITH HOLMIUM LASER LITHOTRIPSY Left 10/26/2016   Procedure: URETEROSCOPY WITH HOLMIUM LASER LITHOTRIPSY;  Surgeon: Nickie Retort, MD;  Location: [facility];  Service: Urology;  Laterality: Left;   VASECTOMY      Family History  Problem Relation Age  of Onset   Kidney failure Mother    Cancer Mother    Cancer Father        lung   Prostate cancer Neg Hx     Social History   Tobacco Use   Smoking status: Former    Packs/day: 1.00    Years: 20.00    Pack years: 20.00    Types: Cigarettes    Quit date: 01/19/1979    Years since quitting: 42.1   Smokeless tobacco: Former    Types: Chew  Substance Use Topics   Alcohol use: Yes    Alcohol/week: 0.0 standard drinks    Comment: Occasional Wine     Current Outpatient Medications:    albuterol (VENTOLIN HFA) 108 (90 Base) MCG/ACT inhaler, INHALE 2 PUFFS INTO THE LUNGS EVERY 4 (FOUR) HOURS AS NEEDED FOR WHEEZING OR SHORTNESS OF BREATH., Disp: 18 g, Rfl: 2   ascorbic acid (VITAMIN C) 250 MG tablet, Take by mouth., Disp: , Rfl:    atorvastatin (LIPITOR) 20 MG tablet, TAKE 1 TABLET BY MOUTH EVERYDAY AT BEDTIME, Disp: 90 tablet, Rfl: 0   budesonide-formoterol (SYMBICORT) 160-4.5 MCG/ACT inhaler, USE 2 INHALATIONS ORALLY   TWICE DAILY FOR CHRONIC    OBSTRUCTIVE PULMONARY      DISEASE, Disp: 30.6 g, Rfl: 3   [employer] ASPIRIN LOW STRENGTH 81 MG EC tablet, TAKE 1 TABLET BY MOUTH EVERY DAY, Disp: 90 tablet, Rfl: 1   finasteride (PROSCAR) 5 MG tablet, Take 1 tablet (5 mg total) by mouth daily., Disp: 90 tablet, Rfl: 3   FLUoxetine (PROZAC) 20 MG capsule, TAKE 1 CAPSULE BY MOUTH EVERY DAY, Disp: 90 capsule, Rfl: 3   fluticasone (FLONASE) 50 MCG/ACT nasal spray, SMARTSIG:2 Spray(s) Both Nares Daily PRN, Disp: , Rfl:    metoprolol succinate (TOPROL-XL) 25 MG 24 hr tablet, TAKE 1 TABLET (25 MG TOTAL) BY MOUTH DAILY., Disp: 90 tablet, Rfl: 0   Multiple Vitamins-Minerals (MULTI ADULT GUMMIES) CHEW, Chew 1 tablet by mouth daily., Disp: , Rfl:    olmesartan (BENICAR) 40 MG tablet, Take 1 tablet (40 mg total) by mouth daily., Disp: 90 tablet, Rfl: 1   traZODone (DESYREL) 100 MG tablet, Take 1 tablet (100 mg total) by mouth at bedtime as needed for sleep. New dose take only one pill, Disp: 90 tablet, Rfl:  1  Allergies  Allergen Reactions   Other Swelling    Lips swell - Potatoes, Kiwi, carrots, celery (raw)  general anesthesia - nausea     I personally reviewed active problem list, medication list, allergies, family history, social history, health maintenance, notes from last encounter with the patient/caregiver today.   ROS  Constitutional: Negative for fever , positive for weight change.  Respiratory: Negative for cough and shortness of breath.   Cardiovascular: Negative for chest pain or palpitations.  Gastrointestinal: Negative for abdominal pain, no  bowel changes.  Musculoskeletal: Negative for gait problem or joint swelling.  Skin: Negative for rash.  Neurological: Positive for dizziness when he moves his head quickly, but no headache.  No other specific complaints in a complete review of systems (except as listed in HPI above).   Objective  Vitals:   03/10/21 1144  BP: 136/84  Pulse: 70  Resp: 16  Temp: 97.7 F (36.5 C)  SpO2: 95%  Weight: 226 lb (102.5 kg)  Height: '5\' 7"'$  (1.702 m)    Body mass index is 35.4 kg/m.  Physical Exam  Constitutional: Patient appears well-developed and well-nourished. Obese  No distress.  HEENT: head atraumatic, normocephalic, pupils equal and reactive to light, neck supple Cardiovascular: Normal rate, regular rhythm and normal heart sounds.  No murmur heard. No BLE edema. Pulmonary/Chest: Effort normal and breath sounds normal. No respiratory distress. Abdominal: Soft.  There is no tenderness. Skin: senile purpura on arms  Psychiatric: Patient has a normal mood and affect. behavior is normal. Judgment and thought content normal.   PHQ2/9: Depression screen [facility] 2/9 03/10/2021 10/19/2020 06/21/2020 06/21/2020 01/19/2020  Decreased Interest 0 0 0 0 0  Down, Depressed, Hopeless 0 0 0 0 0  PHQ - 2 Score 0 0 0 0 0  Altered sleeping - - 1 - 1  Tired, decreased energy - - 1 - 0  Change in appetite - - 0 - 0  Feeling bad or failure  about yourself  - - 0 - 0  Trouble concentrating - - 0 - 0  Moving slowly or fidgety/restless - - 0 - 0  Suicidal thoughts - - 0 - 0  PHQ-9 Score - - 2 - 1  Difficult doing work/chores - - Not difficult at all - Not difficult at all  Some recent data might be hidden    phq 9 is negative   Fall Risk: Fall Risk  03/10/2021 10/19/2020 06/21/2020 01/19/2020 09/21/2019  Falls in the past year? 0 0 0 0 0  Number falls in past yr: 0 0 0 0 0  Injury with Fall? 0 0 0 0 0      Functional Status Survey: Is the patient deaf or have difficulty hearing?: No Does the patient have difficulty seeing, even when wearing glasses/contacts?: No Does the patient have difficulty concentrating, remembering, or making decisions?: No Does the patient have difficulty walking or climbing stairs?: Yes Does the patient have difficulty dressing or bathing?: No Does the patient have difficulty doing errands alone such as visiting a doctor's office or shopping?: No    Assessment & Plan  1. Coronary artery disease involving native coronary artery of native heart with angina pectoris ([place])   2. COPD with asthma ([place])   3. Stable angina pectoris ([facility])  - metoprolol succinate (TOPROL-XL) 25 MG 24 hr tablet; Take 1 tablet (25 mg total) by mouth daily.  Dispense: 90 tablet; Refill: 1  4. Senile purpura ([place])   5. PTSD (post-traumatic stress disorder)   6. Essential hypertension  At goal   7. Morbid obesity ([facility])  BMI above 35 with co=-morbidities   8. BPH with obstruction/lower urinary tract symptoms  - finasteride (PROSCAR) 5 MG tablet; Take 1 tablet (5 mg total) by mouth daily.  Dispense: 90 tablet; Refill: 3  9. Insomnia, unspecified type  - traZODone (DESYREL) 100 MG tablet; Take 1 tablet (100 mg total) by mouth at bedtime as needed for sleep. New dose take only one pill  Dispense: 90 tablet; Refill: 1  10. Mixed hyperlipidemia  - atorvastatin (LIPITOR) 20 MG tablet; TAKE 1 TABLET BY MOUTH  EVERYDAY AT BEDTIME  Dispense: 90 tablet; Refill: 1  11. Insomnia, unspecified type  - traZODone (DESYREL) 100 MG tablet; Take 1 tablet (100 mg total) by mouth at bedtime as needed for sleep. New dose take only one pill  Dispense: 90 tablet; Refill: 1

## 2021-03-10 ENCOUNTER — Ambulatory Visit: Payer: Federal, State, Local not specified - PPO | Admitting: Family Medicine

## 2021-03-10 ENCOUNTER — Other Ambulatory Visit: Payer: Self-pay

## 2021-03-10 ENCOUNTER — Encounter: Payer: Self-pay | Admitting: Family Medicine

## 2021-03-10 VITALS — BP 136/84 | HR 70 | Temp 97.7°F | Resp 16 | Ht 67.0 in | Wt 226.0 lb

## 2021-03-10 DIAGNOSIS — D692 Other nonthrombocytopenic purpura: Secondary | ICD-10-CM

## 2021-03-10 DIAGNOSIS — G4700 Insomnia, unspecified: Secondary | ICD-10-CM

## 2021-03-10 DIAGNOSIS — J449 Chronic obstructive pulmonary disease, unspecified: Secondary | ICD-10-CM

## 2021-03-10 DIAGNOSIS — I25119 Atherosclerotic heart disease of native coronary artery with unspecified angina pectoris: Secondary | ICD-10-CM

## 2021-03-10 DIAGNOSIS — F4310 Post-traumatic stress disorder, unspecified: Secondary | ICD-10-CM

## 2021-03-10 DIAGNOSIS — N401 Enlarged prostate with lower urinary tract symptoms: Secondary | ICD-10-CM

## 2021-03-10 DIAGNOSIS — I10 Essential (primary) hypertension: Secondary | ICD-10-CM

## 2021-03-10 DIAGNOSIS — E782 Mixed hyperlipidemia: Secondary | ICD-10-CM

## 2021-03-10 DIAGNOSIS — N138 Other obstructive and reflux uropathy: Secondary | ICD-10-CM

## 2021-03-10 DIAGNOSIS — I208 Other forms of angina pectoris: Secondary | ICD-10-CM

## 2021-03-10 MED ORDER — FINASTERIDE 5 MG PO TABS: 5.0000 mg | ORAL_TABLET | Freq: Every day | ORAL | 3 refills | Status: DC

## 2021-03-10 MED ORDER — ATORVASTATIN CALCIUM 20 MG PO TABS: ORAL_TABLET | ORAL | 1 refills | Status: DC

## 2021-03-10 MED ORDER — TRAZODONE HCL 100 MG PO TABS: 100.0000 mg | ORAL_TABLET | Freq: Every evening | ORAL | 1 refills | Status: DC | PRN

## 2021-03-10 MED ORDER — METOPROLOL SUCCINATE ER 25 MG PO TB24: 25.0000 mg | ORAL_TABLET | Freq: Every day | ORAL | 1 refills | Status: DC

## 2021-04-04 ENCOUNTER — Other Ambulatory Visit: Payer: Self-pay | Admitting: Internal Medicine

## 2021-04-04 DIAGNOSIS — J449 Chronic obstructive pulmonary disease, unspecified: Secondary | ICD-10-CM

## 2021-06-25 ENCOUNTER — Other Ambulatory Visit: Payer: Self-pay | Admitting: Family Medicine

## 2021-06-25 DIAGNOSIS — J449 Chronic obstructive pulmonary disease, unspecified: Secondary | ICD-10-CM

## 2021-06-26 NOTE — Telephone Encounter (Signed)
Requested Prescriptions  Pending Prescriptions Disp Refills  . budesonide-formoterol (SYMBICORT) 160-4.5 MCG/ACT inhaler [Pharmacy Med Name: BUDES/FORMOT INH 160-4.5] 30.6 g 3    Sig: USE 2 INHALATIONS ORALLY   TWICE DAILY FOR CHRONIC    OBSTRUCTIVE PULMONARY      DISEASE     Pulmonology:  Combination Products Passed - 06/25/2021  8:43 PM      Passed - Valid encounter within last 12 months    Recent Outpatient Visits          3 months ago Coronary artery disease involving native coronary artery of native heart with angina pectoris [facility])   [facility] Steele Sizer, MD   8 months ago COPD with asthma [facility])   [facility] Steele Sizer, MD   1 year ago Essential hypertension   [facility] Steele Sizer, MD   1 year ago Essential hypertension   [facility] Delsa Grana, PA-C   1 year ago Essential hypertension   [facility] Delsa Grana, [place]      Future Appointments            In 2 months Steele Sizer, MD [facility], [facility]

## 2021-08-09 ENCOUNTER — Other Ambulatory Visit: Payer: Self-pay | Admitting: Family Medicine

## 2021-08-09 DIAGNOSIS — I10 Essential (primary) hypertension: Secondary | ICD-10-CM

## 2021-08-09 DIAGNOSIS — F4310 Post-traumatic stress disorder, unspecified: Secondary | ICD-10-CM

## 2021-08-21 ENCOUNTER — Other Ambulatory Visit: Payer: Self-pay | Admitting: Family Medicine

## 2021-08-21 DIAGNOSIS — E782 Mixed hyperlipidemia: Secondary | ICD-10-CM

## 2021-08-22 ENCOUNTER — Other Ambulatory Visit: Payer: Self-pay | Admitting: Family Medicine

## 2021-08-22 DIAGNOSIS — I208 Other forms of angina pectoris: Secondary | ICD-10-CM

## 2021-08-22 NOTE — Telephone Encounter (Signed)
Requested Prescriptions  Pending Prescriptions Disp Refills   aspirin 81 MG EC tablet [Pharmacy Med Name: [facility] ASPIRIN EC 81 MG TABLET] 90 tablet 1    Sig: TAKE 1 TABLET BY MOUTH EVERY DAY     Analgesics:  NSAIDS - aspirin Passed - 08/22/2021  1:30 AM      Passed - Patient is not pregnant      Passed - Valid encounter within last 12 months    Recent Outpatient Visits          5 months ago Coronary artery disease involving native coronary artery of native heart with angina pectoris [facility])   [facility] Steele Sizer, MD   10 months ago COPD with asthma [facility])   [facility] Steele Sizer, MD   1 year ago Essential hypertension   [facility] Steele Sizer, MD   1 year ago Essential hypertension   [facility] Delsa Grana, PA-C   1 year ago Essential hypertension   [facility] Delsa Grana, [place]      Future Appointments            In 2 weeks Steele Sizer, MD [facility], [facility]

## 2021-09-08 NOTE — Progress Notes (Signed)
Name: Willie Crane   MRN: [id number]    DOB: [birth year]   Date:09/11/2021       Progress Note  Subjective  Chief Complaint  Follow Up  HPI  Angina Pectoris: he states possible MI after surgery at the [place], he was seen by Dr. Nehemiah Massed back in 2019, I cannot see the studies done at the time. He has been on medical management . He is on statin, ARB and aspirin He is doing well at this time CT chest showed atherosclerosis of aorta back in 2018 - he states mild sob with activity but doing well overall. LDL at goal   COPD/ Asthma: he has been using Symbicort , he has a morning cough but not daily. He usually sees Dr. Mortimer Fries. He is using Ventolin very seldom now at most 4 times per month.  He has intermittent wheezing and has some SOB but stable and only with strenuous activity . Quit smoking many years ago   Obesity: he lost 15 lbs in the past year, he states he has been eating once a day, skips breakfast, eating more vegetables , he is avoiding junk food and sweets, except for an evening dessert . The weight started to go down during the pandemic, he states just not as hungry but also trying to keep weight down. Asked if he is depressed and he said no - he takes  Prozac for PTSD but not feeling sad   Senile purpura: on arms , he is on aspirin. Unchanged   BPH: Urologist has release him for their care , he is stable.   BPPV: seen by ENT in the past and had Epley maneuver but still has episodes intermittently when turning to left side in bed. Unchanged   Insomnia: he is going to bed around midnight and falls asleep hours later. Currently on Trazodone but not working , he does stay asleep until noon sometimes He states he does not want to change medication   PTSD/Insomnia : he takes prozac and states it works well for him,, no longer having symptoms with medications, he used to have nightmares about [place] . He does not want to go see another psychiatrist at this time He has been stable.   Patient  Active Problem List   Diagnosis Date Noted   COPD with asthma ([place]) 06/21/2020   Senile purpura ([place]) 06/21/2020   Insomnia 01/20/2020   PTSD (post-traumatic stress disorder) 02/10/2019   Morbid obesity ([place]) 06/30/2018   Coronary artery disease 06/30/2018   Essential hypertension 05/19/2018   Stable angina pectoris ([place]) 05/19/2018   Stage 2 moderate COPD by GOLD classification ([place]) 02/12/2017   History of acute myocardial infarction 09/27/2016   Prolonged Q-T interval on ECG 09/27/2016   Moderate persistent asthma 09/28/2015   BPH with obstruction/lower urinary tract symptoms 07/12/2015   Hydrocele, bilateral 07/12/2015   Hyperlipidemia     Past Surgical History:  Procedure Laterality Date   BACK SURGERY     CARDIAC CATHETERIZATION  03/2009   [facility]: No significant coronary artery disease   [facility]  [facility]   CARPAL TUNNEL RELEASE Right    CHOLECYSTECTOMY     COLONOSCOPY     COLONOSCOPY WITH PROPOFOL N/A 10/31/2015   Procedure: COLONOSCOPY WITH PROPOFOL;  Surgeon: Lucilla Lame, MD;  Location: [facility];  Service: Endoscopy;  Laterality: N/A;   CYST EXCISION     CYSTOSCOPY W/ URETERAL STENT PLACEMENT Left 10/26/2016   Procedure: CYSTOSCOPY WITH STENT REPLACEMENT;  Surgeon: Nickie Retort, MD;  Location: [facility];  Service: Urology;  Laterality: Left;   CYSTOSCOPY WITH STENT PLACEMENT Left 10/05/2016   Procedure: CYSTOSCOPY WITH STENT PLACEMENT;  Surgeon: Nickie Retort, MD;  Location: [facility];  Service: Urology;  Laterality: Left;   HERNIA REPAIR     INGUINAL HERNIA REPAIR Right    INGUINAL HERNIA REPAIR Left 01/26/2016   Procedure: HERNIA REPAIR INGUINAL ADULT;  Surgeon: Christene Lye, MD;  Location: [facility];  Service: General;  Laterality: Left;   KNEE ARTHROSCOPY     LUMBAR LAMINECTOMY     POLYPECTOMY  10/31/2015   Procedure: POLYPECTOMY;  Surgeon: Lucilla Lame, MD;  Location: [facility];  Service: Endoscopy;;    URETEROSCOPY Left 10/05/2016   Procedure: URETEROSCOPY;  Surgeon: Nickie Retort, MD;  Location: [facility];  Service: Urology;  Laterality: Left;   URETEROSCOPY WITH HOLMIUM LASER LITHOTRIPSY Left 10/26/2016   Procedure: URETEROSCOPY WITH HOLMIUM LASER LITHOTRIPSY;  Surgeon: Nickie Retort, MD;  Location: [facility];  Service: Urology;  Laterality: Left;   VASECTOMY      Family History  Problem Relation Age of Onset   Kidney failure Mother    Cancer Mother    Cancer Father        lung   Prostate cancer Neg Hx     Social History   Tobacco Use   Smoking status: Former    Packs/day: 1.00    Years: 20.00    Pack years: 20.00    Types: Cigarettes    Quit date: 01/19/1979    Years since quitting: 42.6   Smokeless tobacco: Former    Types: Chew  Substance Use Topics   Alcohol use: Yes    Alcohol/week: 0.0 standard drinks    Comment: Occasional Wine     Current Outpatient Medications:    albuterol (VENTOLIN HFA) 108 (90 Base) MCG/ACT inhaler, INHALE 2 PUFFS INTO THE LUNGS EVERY 4 (FOUR) HOURS AS NEEDED FOR WHEEZING OR SHORTNESS OF BREATH., Disp: 18 g, Rfl: 2   aspirin 81 MG EC tablet, TAKE 1 TABLET BY MOUTH EVERY DAY, Disp: 90 tablet, Rfl: 1   atorvastatin (LIPITOR) 20 MG tablet, TAKE 1 TABLET BY MOUTH EVERYDAY AT BEDTIME, Disp: 90 tablet, Rfl: 0   budesonide-formoterol (SYMBICORT) 160-4.5 MCG/ACT inhaler, USE 2 INHALATIONS ORALLY   TWICE DAILY FOR CHRONIC    OBSTRUCTIVE PULMONARY      DISEASE, Disp: 30.6 g, Rfl: 3   finasteride (PROSCAR) 5 MG tablet, Take 1 tablet (5 mg total) by mouth daily., Disp: 90 tablet, Rfl: 3   FLUoxetine (PROZAC) 20 MG capsule, TAKE 1 CAPSULE BY MOUTH EVERY DAY, Disp: 90 capsule, Rfl: 3   metoprolol succinate (TOPROL-XL) 25 MG 24 hr tablet, Take 1 tablet (25 mg total) by mouth daily., Disp: 90 tablet, Rfl: 1   Multiple Vitamins-Minerals (MULTI ADULT GUMMIES) CHEW, Chew 1 tablet by mouth daily., Disp: , Rfl:    olmesartan (BENICAR) 40 MG tablet, TAKE 1  TABLET BY MOUTH EVERY DAY, Disp: 90 tablet, Rfl: 1   traZODone (DESYREL) 100 MG tablet, Take 1 tablet (100 mg total) by mouth at bedtime as needed for sleep. New dose take only one pill, Disp: 90 tablet, Rfl: 1   ascorbic acid (VITAMIN C) 250 MG tablet, Take by mouth. (Patient not taking: Reported on 09/11/2021), Disp: , Rfl:    fluticasone (FLONASE) 50 MCG/ACT nasal spray, SMARTSIG:2 Spray(s) Both Nares Daily PRN (Patient not taking: Reported on 09/11/2021), Disp: , Rfl:  Allergies  Allergen Reactions   Other Swelling    Lips swell - Potatoes, Kiwi, carrots, celery (raw)  general anesthesia - nausea     I personally reviewed active problem list, medication list, allergies, family history, social history, health maintenance with the patient/caregiver today.   ROS  Constitutional: Negative for fever , positive for gradual weight change.  Respiratory: Negative for cough and shortness of breath.   Cardiovascular: Negative for chest pain or palpitations.  Gastrointestinal: Negative for abdominal pain, no bowel changes.  Musculoskeletal: Negative for gait problem or joint swelling.  Skin: Negative for rash.  Neurological: Negative for dizziness or headache.  No other specific complaints in a complete review of systems (except as listed in HPI above).   Objective  Vitals:   09/11/21 1307  BP: 134/82  Pulse: 80  Resp: 16  SpO2: 97%  Weight: 218 lb (98.9 kg)  Height: 5\' 7"  (1.702 m)    Body mass index is 34.14 kg/m.  Physical Exam  Constitutional: Patient appears well-developed and well-nourished. Obese  No distress.  HEENT: head atraumatic, normocephalic, pupils equal and reactive to light, neck supple Cardiovascular: Normal rate, regular rhythm and normal heart sounds.  No murmur heard. No BLE edema. Pulmonary/Chest: Effort normal and breath sounds normal. No respiratory distress. Abdominal: Soft.  There is no tenderness. Psychiatric: Patient has a normal mood and affect.  behavior is normal. Judgment and thought content normal.   PHQ2/9: Depression screen [facility] 2/9 09/11/2021 03/10/2021 10/19/2020 06/21/2020 06/21/2020  Decreased Interest 0 0 0 0 0  Down, Depressed, Hopeless 0 0 0 0 0  PHQ - 2 Score 0 0 0 0 0  Altered sleeping 0 - - 1 -  Tired, decreased energy 0 - - 1 -  Change in appetite 0 - - 0 -  Feeling bad or failure about yourself  0 - - 0 -  Trouble concentrating 0 - - 0 -  Moving slowly or fidgety/restless 0 - - 0 -  Suicidal thoughts 0 - - 0 -  PHQ-9 Score 0 - - 2 -  Difficult doing work/chores - - - Not difficult at all -  Some recent data might be hidden    phq 9 is negative   Fall Risk: Fall Risk  09/11/2021 03/10/2021 10/19/2020 06/21/2020 01/19/2020  Falls in the past year? 0 0 0 0 0  Number falls in past yr: 0 0 0 0 0  Injury with Fall? 0 0 0 0 0  Risk for fall due to : No Fall Risks - - - -  Follow up Falls prevention discussed - - - -      Functional Status Survey: Is the patient deaf or have difficulty hearing?: No Does the patient have difficulty seeing, even when wearing glasses/contacts?: No Does the patient have difficulty concentrating, remembering, or making decisions?: No Does the patient have difficulty walking or climbing stairs?: No Does the patient have difficulty dressing or bathing?: No Does the patient have difficulty doing errands alone such as visiting a doctor's office or shopping?: No    Assessment & Plan  1. Stable angina pectoris ([place])  - Lipid panel  2. COPD with asthma ([place])   3. Obesity (BMI 30.0-34.9)  Discussed with the patient the risk posed by an increased BMI. Discussed importance of portion control, calorie counting and at least 150 minutes of physical activity weekly. Avoid sweet beverages and drink more water. Eat at least 6 servings of fruit and vegetables daily  4. Essential hypertension  - CBC with Differential/Platelet - COMPLETE METABOLIC PANEL WITH GFR  5. Senile purpura  ([place])   6. PTSD (post-traumatic stress disorder)   7. Coronary artery disease involving native coronary artery of native heart with angina pectoris (HCC)  - Lipid panel  8. BPH with obstruction/lower urinary tract symptoms   9. Insomnia, unspecified type

## 2021-09-11 ENCOUNTER — Encounter: Payer: Self-pay | Admitting: Family Medicine

## 2021-09-11 ENCOUNTER — Ambulatory Visit: Payer: Federal, State, Local not specified - PPO | Admitting: Family Medicine

## 2021-09-11 VITALS — BP 134/82 | HR 80 | Resp 16 | Ht 67.0 in | Wt 218.0 lb

## 2021-09-11 DIAGNOSIS — E669 Obesity, unspecified: Secondary | ICD-10-CM

## 2021-09-11 DIAGNOSIS — N401 Enlarged prostate with lower urinary tract symptoms: Secondary | ICD-10-CM

## 2021-09-11 DIAGNOSIS — F4310 Post-traumatic stress disorder, unspecified: Secondary | ICD-10-CM

## 2021-09-11 DIAGNOSIS — J449 Chronic obstructive pulmonary disease, unspecified: Secondary | ICD-10-CM | POA: Diagnosis not present

## 2021-09-11 DIAGNOSIS — I10 Essential (primary) hypertension: Secondary | ICD-10-CM | POA: Diagnosis not present

## 2021-09-11 DIAGNOSIS — N138 Other obstructive and reflux uropathy: Secondary | ICD-10-CM

## 2021-09-11 DIAGNOSIS — G4700 Insomnia, unspecified: Secondary | ICD-10-CM

## 2021-09-11 DIAGNOSIS — I208 Other forms of angina pectoris: Secondary | ICD-10-CM | POA: Diagnosis not present

## 2021-09-11 DIAGNOSIS — I25119 Atherosclerotic heart disease of native coronary artery with unspecified angina pectoris: Secondary | ICD-10-CM

## 2021-09-11 DIAGNOSIS — D692 Other nonthrombocytopenic purpura: Secondary | ICD-10-CM

## 2021-09-11 LAB — CBC WITH DIFFERENTIAL/PLATELET
Absolute Monocytes: 540 cells/uL (ref 200–950)
Basophils Absolute: 52 cells/uL (ref 0–200)
Basophils Relative: 0.7 %
Eosinophils Absolute: 222 cells/uL (ref 15–500)
Eosinophils Relative: 3 %
HCT: 46.2 % (ref 38.5–50.0)
Hemoglobin: 15.8 g/dL (ref 13.2–17.1)
Lymphs Abs: 1495 cells/uL (ref 850–3900)
MCH: 30.7 pg (ref 27.0–33.0)
MCHC: 34.2 g/dL (ref 32.0–36.0)
MCV: 89.9 fL (ref 80.0–100.0)
MPV: 10.4 fL (ref 7.5–12.5)
Monocytes Relative: 7.3 %
Neutro Abs: 5091 cells/uL (ref 1500–7800)
Neutrophils Relative %: 68.8 %
Platelets: 206 10*3/uL (ref 140–400)
RBC: 5.14 10*6/uL (ref 4.20–5.80)
RDW: 12.5 % (ref 11.0–15.0)
Total Lymphocyte: 20.2 %
WBC: 7.4 10*3/uL (ref 3.8–10.8)

## 2021-09-11 LAB — COMPLETE METABOLIC PANEL WITH GFR
AG Ratio: 1.7 (calc) (ref 1.0–2.5)
ALT: 17 U/L (ref 9–46)
AST: 17 U/L (ref 10–35)
Albumin: 3.8 g/dL (ref 3.6–5.1)
Alkaline phosphatase (APISO): 80 U/L (ref 35–144)
BUN: 16 mg/dL (ref 7–25)
CO2: 28 mmol/L (ref 20–32)
Calcium: 9.1 mg/dL (ref 8.6–10.3)
Chloride: 105 mmol/L (ref 98–110)
Creat: 0.94 mg/dL (ref 0.70–1.28)
Globulin: 2.3 g/dL (calc) (ref 1.9–3.7)
Glucose, Bld: 83 mg/dL (ref 65–99)
Potassium: 4.2 mmol/L (ref 3.5–5.3)
Sodium: 141 mmol/L (ref 135–146)
Total Bilirubin: 0.8 mg/dL (ref 0.2–1.2)
Total Protein: 6.1 g/dL (ref 6.1–8.1)
eGFR: 84 mL/min/{1.73_m2} (ref >=60)

## 2021-09-11 LAB — LIPID PANEL
Cholesterol: 148 mg/dL (ref <200)
HDL: 52 mg/dL (ref >=40)
LDL Cholesterol (Calc): 71 mg/dL (calc)
Non-HDL Cholesterol (Calc): 96 mg/dL (calc) (ref <130)
Total CHOL/HDL Ratio: 2.8 (calc) (ref <5.0)
Triglycerides: 173 mg/dL — ABNORMAL HIGH (ref <150)

## 2021-10-14 ENCOUNTER — Other Ambulatory Visit: Payer: Self-pay | Admitting: Family Medicine

## 2021-10-14 DIAGNOSIS — G4700 Insomnia, unspecified: Secondary | ICD-10-CM

## 2021-11-09 ENCOUNTER — Other Ambulatory Visit: Payer: Self-pay | Admitting: Family Medicine

## 2021-11-09 DIAGNOSIS — I208 Other forms of angina pectoris: Secondary | ICD-10-CM

## 2021-11-19 ENCOUNTER — Other Ambulatory Visit: Payer: Self-pay | Admitting: Family Medicine

## 2021-11-19 DIAGNOSIS — E782 Mixed hyperlipidemia: Secondary | ICD-10-CM

## 2022-01-26 ENCOUNTER — Other Ambulatory Visit: Payer: Self-pay | Admitting: Internal Medicine

## 2022-01-26 ENCOUNTER — Other Ambulatory Visit: Payer: Self-pay | Admitting: Family Medicine

## 2022-01-26 DIAGNOSIS — G4700 Insomnia, unspecified: Secondary | ICD-10-CM

## 2022-01-26 DIAGNOSIS — I10 Essential (primary) hypertension: Secondary | ICD-10-CM

## 2022-02-08 ENCOUNTER — Other Ambulatory Visit: Payer: Self-pay | Admitting: Family Medicine

## 2022-02-08 DIAGNOSIS — I208 Other forms of angina pectoris: Secondary | ICD-10-CM

## 2022-02-24 ENCOUNTER — Other Ambulatory Visit: Payer: Self-pay | Admitting: Family Medicine

## 2022-02-24 DIAGNOSIS — N138 Other obstructive and reflux uropathy: Secondary | ICD-10-CM

## 2022-02-24 DIAGNOSIS — I208 Other forms of angina pectoris: Secondary | ICD-10-CM

## 2022-03-13 ENCOUNTER — Encounter: Payer: Self-pay | Admitting: Family Medicine

## 2022-03-13 ENCOUNTER — Ambulatory Visit: Payer: Federal, State, Local not specified - PPO | Admitting: Family Medicine

## 2022-03-13 ENCOUNTER — Ambulatory Visit
Admission: RE | Admit: 2022-03-13 | Discharge: 2022-03-13 | Disposition: A | Discharge status: Home / Self Care | Payer: Federal, State, Local not specified - PPO | Source: Ambulatory Visit | Attending: Family Medicine | Admitting: Family Medicine

## 2022-03-13 ENCOUNTER — Ambulatory Visit
Admission: RE | Admit: 2022-03-13 | Discharge: 2022-03-13 | Disposition: A | Discharge status: Home / Self Care | Payer: Federal, State, Local not specified - PPO | Attending: Family Medicine | Admitting: Family Medicine

## 2022-03-13 VITALS — BP 136/78 | HR 93 | Temp 98.1°F | Resp 16 | Ht 68.0 in | Wt 221.4 lb

## 2022-03-13 DIAGNOSIS — J439 Emphysema, unspecified: Secondary | ICD-10-CM | POA: Insufficient documentation

## 2022-03-13 DIAGNOSIS — J45909 Unspecified asthma, uncomplicated: Secondary | ICD-10-CM | POA: Diagnosis not present

## 2022-03-13 DIAGNOSIS — K760 Fatty (change of) liver, not elsewhere classified: Secondary | ICD-10-CM

## 2022-03-13 DIAGNOSIS — I208 Other forms of angina pectoris: Secondary | ICD-10-CM

## 2022-03-13 DIAGNOSIS — F4310 Post-traumatic stress disorder, unspecified: Secondary | ICD-10-CM

## 2022-03-13 DIAGNOSIS — I10 Essential (primary) hypertension: Secondary | ICD-10-CM

## 2022-03-13 DIAGNOSIS — E669 Obesity, unspecified: Secondary | ICD-10-CM

## 2022-03-13 DIAGNOSIS — J449 Chronic obstructive pulmonary disease, unspecified: Secondary | ICD-10-CM

## 2022-03-13 DIAGNOSIS — D692 Other nonthrombocytopenic purpura: Secondary | ICD-10-CM | POA: Diagnosis not present

## 2022-03-13 DIAGNOSIS — Z87891 Personal history of nicotine dependence: Secondary | ICD-10-CM | POA: Diagnosis not present

## 2022-03-13 DIAGNOSIS — I25119 Atherosclerotic heart disease of native coronary artery with unspecified angina pectoris: Secondary | ICD-10-CM

## 2022-03-13 DIAGNOSIS — N138 Other obstructive and reflux uropathy: Secondary | ICD-10-CM

## 2022-03-13 DIAGNOSIS — R0789 Other chest pain: Secondary | ICD-10-CM | POA: Insufficient documentation

## 2022-03-13 DIAGNOSIS — G4700 Insomnia, unspecified: Secondary | ICD-10-CM

## 2022-03-13 DIAGNOSIS — N401 Enlarged prostate with lower urinary tract symptoms: Secondary | ICD-10-CM

## 2022-03-13 MED ORDER — QUETIAPINE FUMARATE 25 MG PO TABS: 25.0000 mg | ORAL_TABLET | Freq: Every day | ORAL | 1 refills | Status: DC

## 2022-03-13 MED ORDER — BENZONATATE 100 MG PO CAPS: 100.0000 mg | ORAL_CAPSULE | Freq: Three times a day (TID) | ORAL | 0 refills | Status: DC | PRN

## 2022-03-13 NOTE — Progress Notes (Signed)
Name: Willie Crane   MRN: [id number]    DOB: 1946-03-21   Date:03/13/2022       Progress Note  Subjective  Chief Complaint  Follow Up  HPI  Angina Pectoris: he states possible MI after surgery at the [place], he was seen by Dr. Nehemiah Massed back in 2019, I cannot see the studies done at the time. He has been on medical management . He is on statin, ARB and aspirin He is doing well at this time CT chest showed atherosclerosis of aorta back in 2018 - he states mild sob with activity but able to mow his yard without problems.   COPD/ Asthma: he has been using Symbicort . He usually sees Dr. Mortimer Fries. He is using Ventolin very seldom now at most 4 times per month.  He has intermittent wheezing and has some SOB but stable, he has noticed increase in cough lately and associated pain on ribs ( has to hold it with his arms when coughing) We will check CXR and try tessalon perles advised if not better go back to Dr. Mortimer Fries  . Quit smoking many years ago   Obesity: he had lost 15 lbs in the past year, but since last visit but is gradually gaining it back. Today is 221.4 lbs . He is only eating one meal a day, but has a sweet / dessert every night and has not been active   Senile purpura: on arms , he is on aspirin. Unchanged   BPH: Urologist has release him for their care , he is stable. 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 Discussed referring him back  Insomnia: he is going to bed around midnight and falls asleep hours later. Currently on Trazodone but not working  discussed changing to to seroquel and he would like to try it   PTSD/Insomnia : he takes prozac and states it works well for him,, no longer having symptoms with medications, he used to have nightmares about [place] . Continue current medication and switching from trazodone to seroquel since trazodone does not  help him fall asleep   Patient Active Problem List   Diagnosis Date Noted   COPD with asthma ([place]) 06/21/2020   Senile purpura ([place]) 06/21/2020   Insomnia 01/20/2020   PTSD (post-traumatic stress disorder) 02/10/2019   Coronary artery disease 06/30/2018   Essential hypertension 05/19/2018   Stable angina pectoris ([place]) 05/19/2018   Stage 2 moderate COPD by GOLD classification ([place]) 02/12/2017   History of acute myocardial infarction 09/27/2016   Prolonged Q-T interval on ECG 09/27/2016   Moderate persistent asthma 09/28/2015   BPH with obstruction/lower urinary tract symptoms 07/12/2015   Hydrocele, bilateral 07/12/2015   Hyperlipidemia     Past Surgical History:  Procedure Laterality Date   BACK SURGERY     CARDIAC CATHETERIZATION  03/2009   [facility]: No significant coronary artery disease   [facility]   CARPAL TUNNEL RELEASE Right    CHOLECYSTECTOMY     COLONOSCOPY     COLONOSCOPY WITH PROPOFOL N/A 10/31/2015   Procedure: COLONOSCOPY WITH PROPOFOL;  Surgeon: Lucilla Lame, MD;  Location: [facility];  Service: Endoscopy;  Laterality: N/A;   CYST EXCISION     CYSTOSCOPY W/ URETERAL STENT PLACEMENT Left 10/26/2016   Procedure: CYSTOSCOPY WITH STENT REPLACEMENT;  Surgeon:  Nickie Retort, MD;  Location: [facility];  Service: Urology;  Laterality: Left;   CYSTOSCOPY WITH STENT PLACEMENT Left 10/05/2016   Procedure: CYSTOSCOPY WITH STENT PLACEMENT;  Surgeon: Nickie Retort, MD;  Location: [facility];  Service: Urology;  Laterality: Left;   HERNIA REPAIR     INGUINAL HERNIA REPAIR Right    INGUINAL HERNIA REPAIR Left 01/26/2016   Procedure: HERNIA REPAIR INGUINAL ADULT;  Surgeon: Christene Lye, MD;  Location: [facility];  Service: General;  Laterality: Left;   KNEE ARTHROSCOPY     LUMBAR LAMINECTOMY     POLYPECTOMY  10/31/2015   Procedure: POLYPECTOMY;  Surgeon: Lucilla Lame, MD;  Location: [facility];  Service: Endoscopy;;    URETEROSCOPY Left 10/05/2016   Procedure: URETEROSCOPY;  Surgeon: Nickie Retort, MD;  Location: [facility];  Service: Urology;  Laterality: Left;   URETEROSCOPY WITH HOLMIUM LASER LITHOTRIPSY Left 10/26/2016   Procedure: URETEROSCOPY WITH HOLMIUM LASER LITHOTRIPSY;  Surgeon: Nickie Retort, MD;  Location: [facility];  Service: Urology;  Laterality: Left;   VASECTOMY      Family History  Problem Relation Age of Onset   Kidney failure Mother    Cancer Mother    Cancer Father        lung   Prostate cancer Neg Hx     Social History   Tobacco Use   Smoking status: Former    Packs/day: 1.00    Years: 20.00    Total pack years: 20.00    Types: Cigarettes    Quit date: 01/19/1979    Years since quitting: 43.1   Smokeless tobacco: Former    Types: Chew  Substance Use Topics   Alcohol use: Yes    Alcohol/week: 0.0 standard drinks of alcohol    Comment: Occasional Wine     Current Outpatient Medications:    albuterol (VENTOLIN HFA) 108 (90 Base) MCG/ACT inhaler, INHALE 2 PUFFS INTO THE LUNGS EVERY 4 (FOUR) HOURS AS NEEDED FOR WHEEZING OR SHORTNESS OF BREATH., Disp: 18 g, Rfl: 2   ASPIRIN LOW DOSE 81 MG tablet, TAKE 1 TABLET BY MOUTH EVERY DAY, Disp: 90 tablet, Rfl: 1   atorvastatin (LIPITOR) 20 MG tablet, TAKE 1 TABLET BY MOUTH EVERYDAY AT BEDTIME, Disp: 90 tablet, Rfl: 1   budesonide-formoterol (SYMBICORT) 160-4.5 MCG/ACT inhaler, USE 2 INHALATIONS ORALLY   TWICE DAILY FOR CHRONIC    OBSTRUCTIVE PULMONARY      DISEASE, Disp: 30.6 g, Rfl: 3   finasteride (PROSCAR) 5 MG tablet, TAKE 1 TABLET (5 MG TOTAL) BY MOUTH DAILY., Disp: 90 tablet, Rfl: 0   FLUoxetine (PROZAC) 20 MG capsule, TAKE 1 CAPSULE BY MOUTH EVERY DAY, Disp: 90 capsule, Rfl: 3   metoprolol succinate (TOPROL-XL) 25 MG 24 hr tablet, TAKE 1 TABLET (25 MG TOTAL) BY MOUTH DAILY., Disp: 90 tablet, Rfl: 0   Multiple Vitamins-Minerals (MULTI ADULT GUMMIES) CHEW, Chew 1 tablet by mouth daily., Disp: , Rfl:    olmesartan (BENICAR)  40 MG tablet, TAKE 1 TABLET BY MOUTH EVERY DAY, Disp: 90 tablet, Rfl: 0   traZODone (DESYREL) 100 MG tablet, TAKE 1 TABLET (100 MG TOTAL) BY MOUTH AT BEDTIME AS NEEDED FOR SLEEP. NEW DOSE TAKE ONLY ONE PILL, Disp: 90 tablet, Rfl: 0   fluticasone (FLONASE) 50 MCG/ACT nasal spray, SMARTSIG:2 Spray(s) Both Nares Daily PRN (Patient not taking: Reported on 03/13/2022), Disp: , Rfl:   Allergies  Allergen Reactions   Other Swelling    Lips swell - Potatoes, Kiwi, carrots,  celery (raw)  general anesthesia - nausea     I personally reviewed active problem list, medication list, allergies, family history, social history, health maintenance with the patient/caregiver today.   ROS  Constitutional: Negative for fever or weight change.  Respiratory: Negative for cough and shortness of breath.   Cardiovascular: Negative for chest pain or palpitations.  Gastrointestinal: Negative for abdominal pain, no bowel changes.  Musculoskeletal: Negative for gait problem or joint swelling.  Skin: Negative for rash.  Neurological: Negative for dizziness or headache.  No other specific complaints in a complete review of systems (except as listed in HPI above).   Objective  Vitals:   03/13/22 1325  BP: 136/78  Pulse: 93  Resp: 16  Temp: 98.1 F (36.7 C)  TempSrc: Oral  SpO2: 93%  Weight: 221 lb 6.4 oz (100.4 kg)  Height: '5\' 8"'$  (1.727 m)    Body mass index is 33.66 kg/m.  Physical Exam  Constitutional: Patient appears well-developed and well-nourished. Obese  No distress.  HEENT: head atraumatic, normocephalic, pupils equal and reactive to light, neck supple Cardiovascular: Normal rate, regular rhythm and normal heart sounds.  No murmur heard. No BLE edema. Pulmonary/Chest: Effort normal and breath sounds normal. No respiratory distress. Abdominal: Soft.  There is no tenderness. Psychiatric: Patient has a normal mood and affect. behavior is normal. Judgment and thought content normal.    PHQ2/9:    09/11/2021    1:06 PM 03/10/2021   11:43 AM 10/19/2020    2:54 PM 06/21/2020    3:15 PM 06/21/2020    3:07 PM  Depression screen PHQ 2/9  Decreased Interest 0 0 0 0 0  Down, Depressed, Hopeless 0 0 0 0 0  PHQ - 2 Score 0 0 0 0 0  Altered sleeping 0   1   Tired, decreased energy 0   1   Change in appetite 0   0   Feeling bad or failure about yourself  0   0   Trouble concentrating 0   0   Moving slowly or fidgety/restless 0   0   Suicidal thoughts 0   0   PHQ-9 Score 0   2   Difficult doing work/chores    Not difficult at all     phq 9 is negative   Fall Risk:    03/13/2022    1:24 PM 09/11/2021    1:06 PM 03/10/2021   11:43 AM 10/19/2020    2:54 PM 06/21/2020    3:06 PM  Fall Risk   Falls in the past year? 0 0 0 0 0  Number falls in past yr:  0 0 0 0  Injury with Fall?  0 0 0 0  Risk for fall due to : No Fall Risks No Fall Risks     Follow up Falls prevention discussed Falls prevention discussed         Functional Status Survey: Is the patient deaf or have difficulty hearing?: No Does the patient have difficulty seeing, even when wearing glasses/contacts?: No Does the patient have difficulty concentrating, remembering, or making decisions?: No Does the patient have difficulty walking or climbing stairs?: No Does the patient have difficulty dressing or bathing?: No Does the patient have difficulty doing errands alone such as visiting a doctor's office or shopping?: No    Assessment & Plan  1. Stable angina pectoris ([place])  On medical management   2. COPD with asthma ([place])  - DG Chest 2 View; Future -  benzonatate (TESSALON PERLES) 100 MG capsule; Take 1-2 capsules (100-200 mg total) by mouth 3 (three) times daily as needed for cough.  Dispense: 40 capsule; Refill: 0  3. Senile purpura ([facility])  arms  4. Coronary artery disease involving native coronary artery of native heart with angina pectoris ([place])   5. Essential hypertension  At goal   6.  PTSD (post-traumatic stress disorder)  Doing well on medication   7. BPH with obstruction/lower urinary tract symptoms   8. Obesity (BMI 30.0-34.9)  Discussed with the patient the risk posed by an increased BMI. Discussed importance of portion control, calorie counting and at least 150 minutes of physical activity weekly. Avoid sweet beverages and drink more water. Eat at least 6 servings of fruit and vegetables daily    9. Fatty liver   10. Chest wall pain   11. Insomnia, unspecified type  - QUEtiapine (SEROQUEL) 25 MG tablet; Take 1 tablet (25 mg total) by mouth at bedtime. In place of seroquel for sleep  Dispense: 90 tablet; Refill: 1

## 2022-03-14 ENCOUNTER — Telehealth: Payer: Self-pay

## 2022-03-14 NOTE — Telephone Encounter (Signed)
Copied from [place] [id number]. Topic: General - Other >> Mar 13, 2022  5:17 PM Ja-Kwan M wrote: Reason for CRM: Pt stated he brought his medications in for his appt and he feels he may have left them in the office. Pt requests call back to advise if medications were left in the office. >> Mar 13, 2022  5:21 PM Cyndi Bender wrote: Pt stated he located his medications so please disregard the previous message.

## 2022-03-14 NOTE — Telephone Encounter (Signed)
Called and let pt know it was not here.

## 2022-04-17 ENCOUNTER — Other Ambulatory Visit: Payer: Self-pay | Admitting: Family Medicine

## 2022-04-17 DIAGNOSIS — J449 Chronic obstructive pulmonary disease, unspecified: Secondary | ICD-10-CM

## 2022-04-19 ENCOUNTER — Telehealth: Payer: Self-pay | Admitting: *Deleted

## 2022-04-19 NOTE — Telephone Encounter (Signed)
Pt calling for CXR results from 03/13/22. Result note from Dr. Ancil Boozer read to pt, verbalizes understanding.    "No acute findings, just stable emphysema"

## 2022-05-09 ENCOUNTER — Other Ambulatory Visit: Payer: Self-pay | Admitting: Family Medicine

## 2022-05-09 DIAGNOSIS — I2089 Other forms of angina pectoris: Secondary | ICD-10-CM

## 2022-05-26 ENCOUNTER — Other Ambulatory Visit: Payer: Self-pay | Admitting: Family Medicine

## 2022-05-26 DIAGNOSIS — I10 Essential (primary) hypertension: Secondary | ICD-10-CM

## 2022-05-26 DIAGNOSIS — N138 Other obstructive and reflux uropathy: Secondary | ICD-10-CM

## 2022-05-26 DIAGNOSIS — E782 Mixed hyperlipidemia: Secondary | ICD-10-CM

## 2022-07-08 ENCOUNTER — Other Ambulatory Visit: Payer: Self-pay | Admitting: Family Medicine

## 2022-07-08 DIAGNOSIS — G4700 Insomnia, unspecified: Secondary | ICD-10-CM

## 2022-08-27 ENCOUNTER — Encounter: Payer: Self-pay | Admitting: Emergency Medicine

## 2022-08-27 ENCOUNTER — Emergency Department: Payer: Federal, State, Local not specified - PPO

## 2022-08-27 ENCOUNTER — Emergency Department
Admission: EM | Admit: 2022-08-27 | Discharge: 2022-08-27 | Disposition: A | Discharge status: Home / Self Care | Payer: Federal, State, Local not specified - PPO | Attending: Emergency Medicine | Admitting: Emergency Medicine

## 2022-08-27 ENCOUNTER — Other Ambulatory Visit: Payer: Self-pay

## 2022-08-27 DIAGNOSIS — R109 Unspecified abdominal pain: Secondary | ICD-10-CM | POA: Diagnosis present

## 2022-08-27 DIAGNOSIS — Z20822 Contact with and (suspected) exposure to covid-19: Secondary | ICD-10-CM | POA: Diagnosis not present

## 2022-08-27 DIAGNOSIS — A0811 Acute gastroenteropathy due to Norwalk agent: Secondary | ICD-10-CM | POA: Insufficient documentation

## 2022-08-27 LAB — CBC WITH DIFFERENTIAL/PLATELET
Abs Immature Granulocytes: 0.07 10*3/uL (ref 0.00–0.07)
Basophils Absolute: 0 10*3/uL (ref 0.0–0.1)
Basophils Relative: 0 %
Eosinophils Absolute: 0 10*3/uL (ref 0.0–0.5)
Eosinophils Relative: 0 %
HCT: 52 % (ref 39.0–52.0)
Hemoglobin: 17.6 g/dL — ABNORMAL HIGH (ref 13.0–17.0)
Immature Granulocytes: 0 %
Lymphocytes Relative: 3 %
Lymphs Abs: 0.4 10*3/uL — ABNORMAL LOW (ref 0.7–4.0)
MCH: 30.3 pg (ref 26.0–34.0)
MCHC: 33.8 g/dL (ref 30.0–36.0)
MCV: 89.5 fL (ref 80.0–100.0)
Monocytes Absolute: 0.7 10*3/uL (ref 0.1–1.0)
Monocytes Relative: 4 %
Neutro Abs: 15.7 10*3/uL — ABNORMAL HIGH (ref 1.7–7.7)
Neutrophils Relative %: 93 %
Platelets: 246 10*3/uL (ref 150–400)
RBC: 5.81 MIL/uL (ref 4.22–5.81)
RDW: 12.6 % (ref 11.5–15.5)
WBC: 17 10*3/uL — ABNORMAL HIGH (ref 4.0–10.5)
nRBC: 0 % (ref 0.0–0.2)

## 2022-08-27 LAB — COMPREHENSIVE METABOLIC PANEL
ALT: 29 U/L (ref 0–44)
AST: 29 U/L (ref 15–41)
Albumin: 4.3 g/dL (ref 3.5–5.0)
Alkaline Phosphatase: 78 U/L (ref 38–126)
Anion gap: 10 (ref 5–15)
BUN: 23 mg/dL (ref 8–23)
CO2: 25 mmol/L (ref 22–32)
Calcium: 9.6 mg/dL (ref 8.9–10.3)
Chloride: 103 mmol/L (ref 98–111)
Creatinine, Ser: 1.12 mg/dL (ref 0.61–1.24)
GFR, Estimated: >60 mL/min (ref >60)
Glucose, Bld: 171 mg/dL — ABNORMAL HIGH (ref 70–99)
Potassium: 4 mmol/L (ref 3.5–5.1)
Sodium: 138 mmol/L (ref 135–145)
Total Bilirubin: 1.1 mg/dL (ref 0.3–1.2)
Total Protein: 7.7 g/dL (ref 6.5–8.1)

## 2022-08-27 LAB — RESP PANEL BY RT-PCR (RSV, FLU A&B, COVID)  RVPGX2
Influenza A by PCR: NEGATIVE
Influenza B by PCR: NEGATIVE
Resp Syncytial Virus by PCR: NEGATIVE
SARS Coronavirus 2 by RT PCR: NEGATIVE

## 2022-08-27 LAB — TROPONIN I (HIGH SENSITIVITY): Troponin I (High Sensitivity): 6 ng/L (ref <18)

## 2022-08-27 LAB — LIPASE, BLOOD: Lipase: 29 U/L (ref 11–51)

## 2022-08-27 MED ORDER — DICYCLOMINE HCL 10 MG PO CAPS: 10.0000 mg | ORAL_CAPSULE | Freq: Three times a day (TID) | ORAL | 0 refills | Status: DC

## 2022-08-27 MED ORDER — IOHEXOL 300 MG/ML  SOLN
100.0000 mL | Freq: Once | INTRAMUSCULAR | Status: AC | PRN
  Administered 2022-08-27: 100 mL via INTRAVENOUS

## 2022-08-27 MED ORDER — ONDANSETRON HCL 4 MG/2ML IJ SOLN
4.0000 mg | Freq: Once | INTRAMUSCULAR | Status: AC
  Administered 2022-08-27: 4 mg via INTRAVENOUS
  Filled 2022-08-27: qty 2

## 2022-08-27 MED ORDER — SODIUM CHLORIDE 0.9 % IV BOLUS
1000.0000 mL | Freq: Once | INTRAVENOUS | Status: AC
  Administered 2022-08-27: 1000 mL via INTRAVENOUS

## 2022-08-27 MED ORDER — LOPERAMIDE HCL 2 MG PO TABS: 2.0000 mg | ORAL_TABLET | Freq: Four times a day (QID) | ORAL | 0 refills | Status: DC | PRN

## 2022-08-27 MED ORDER — ONDANSETRON 4 MG PO TBDP: 4.0000 mg | ORAL_TABLET | Freq: Three times a day (TID) | ORAL | 0 refills | Status: DC | PRN

## 2022-08-27 NOTE — ED Provider Notes (Signed)
[facility] Provider Note  Patient Contact: 4:25 PM (approximate)   History   Nausea   HPI  Willie Crane is a 77 y.o. male who presents emergency department complaining of diffuse abdominal pain, nausea, vomiting, diarrhea.  Patient states that 3 days ago, he felt fine.  He eaten a late snack, went to bed and then had abdominal pain, and significant nausea, vomiting, diarrhea since.  Patient states that the pain is best described as if he had been hit by a baseball bat in the middle of his abdomen and everything is now "sore."  Patient states that his emesis appears "funky colors" but has had no blood in emesis or diarrhea.  Denies any recent antibiotic use.  Denies contact with anybody with similar symptoms.  Patient feels drained, fatigue, body aches.  No urinary changes.     Physical Exam   Triage Vital Signs: ED Triage Vitals  Enc Vitals Group     BP 08/27/22 1445 (!) 153/84     Pulse Rate 08/27/22 1445 (!) 107     Resp 08/27/22 1445 18     Temp 08/27/22 1445 98.6 F (37 C)     Temp Source 08/27/22 1445 Oral     SpO2 08/27/22 1445 95 %     Weight 08/27/22 1443 200 lb (90.7 kg)     Height 08/27/22 1443 '5\' 7"'$  (1.702 m)     Head Circumference --      Peak Flow --      Pain Score 08/27/22 1442 3     Pain Loc --      Pain Edu? --      Excl. in [place]? --     Most recent vital signs: Vitals:   08/27/22 1837 08/27/22 1900  BP:  (!) 170/107  Pulse:  100  Resp:  12  Temp: 98.4 F (36.9 C)   SpO2:  91%     General: Alert and in no acute distress. ENT:      Ears:       Nose: No congestion/rhinnorhea.      Mouth/Throat: Mucous membranes are moist. Neck: No stridor.   Cardiovascular:  Good peripheral perfusion Respiratory: Normal respiratory effort without tachypnea or retractions. Lungs CTAB. Good air entry to the bases with no decreased or absent breath sounds Gastrointestinal: Bowel sounds 4 quadrants.  Soft to palpation all quadrants.   Patient has diffuse central abdominal tenderness.  Does not localize to any quadrant.. No guarding or rigidity. No palpable masses. No distention. No CVA tenderness. Musculoskeletal: Full range of motion to all extremities.  Neurologic:  No gross focal neurologic deficits are appreciated.  Skin:   No rash noted Other:   ED Results / Procedures / Treatments   Labs (all labs ordered are listed, but only abnormal results are displayed) Labs Reviewed  CBC WITH DIFFERENTIAL/PLATELET - Abnormal; Notable for the following components:      Result Value   WBC 17.0 (*)    Hemoglobin 17.6 (*)    Neutro Abs 15.7 (*)    Lymphs Abs 0.4 (*)    All other components within normal limits  COMPREHENSIVE METABOLIC PANEL - Abnormal; Notable for the following components:   Glucose, Bld 171 (*)    All other components within normal limits  RESP PANEL BY RT-PCR (RSV, FLU A&B, COVID)  [id number]  GASTROINTESTINAL PANEL BY PCR, STOOL (REPLACES STOOL CULTURE)  C DIFFICILE QUICK SCREEN W PCR REFLEX    LIPASE, BLOOD  URINALYSIS, ROUTINE W REFLEX MICROSCOPIC  TROPONIN I (HIGH SENSITIVITY)     EKG     RADIOLOGY  I personally viewed, evaluated, and interpreted these images as part of my medical decision making, as well as reviewing the written report by the radiologist.  ED Provider Interpretation: No acute findings on CT scan to explain patient's nausea, vomiting, diarrhea.  Patient has incidental findings of cysts on bilateral kidneys.  CT ABDOMEN PELVIS W CONTRAST  Result Date: 08/27/2022 CLINICAL DATA:  Abdominal pain.  Nausea vomiting EXAM: CT ABDOMEN AND PELVIS WITH CONTRAST TECHNIQUE: Multidetector CT imaging of the abdomen and pelvis was performed using the standard protocol following bolus administration of intravenous contrast. RADIATION DOSE REDUCTION: This exam was performed according to the departmental dose-optimization program which includes automated exposure control, adjustment of the mA  and/or kV according to patient size and/or use of iterative reconstruction technique. CONTRAST:  150m OMNIPAQUE IOHEXOL 300 MG/ML  SOLN COMPARISON:  11/04/2016 CT and older. Renal ultrasound 12/11/2016. X-ray 07/17/2018 FINDINGS: Lower chest: Breathing motion at the lung bases. No pleural effusion. Mild basilar atelectasis. Hepatobiliary: Mild fatty liver infiltration. Previous cholecystectomy. No space-occupying liver lesion. Patent portal vein. Pancreas: Severe fatty infiltration of the pancreas. No enhancing mass lesion or obvious duct dilatation. Spleen: Spleen is nonenlarged.  Homogeneous enhancement. Adrenals/Urinary Tract: The adrenal glands are preserved. Prominent bilateral renal sinus fat without collecting system dilatation. There multiple large simple appearing Bosniak 1 cysts. Largest is seen exophytic lateral from the right kidney with diameter maximum of 15 cm and Hounsfield unit of 18. Lower pole focus on the right has diameter of 6.5 cm and Hounsfield unit of 16. Dominant focus on the left anterolateral has maximal dimension axially of 8.7 cm and Hounsfield unit of 9. Other smaller foci along the posterior upper left kidney. Bosniak 1 lesions. No specific imaging follow-up. Punctate nonobstructing bilateral renal stones. Ureters have a normal course and caliber down to the bladder. Preserved contours of the urinary bladder. Prominent prostate. Stomach/Bowel: Large bowel is of normal course and caliber with scattered stool. Left-sided colonic diverticula. Normal appendix. Stomach is mildly distended with fluid. Small bowel is nondilated. Several loops of small bowel in the midabdomen are fluid-filled. Ileum. Vascular/Lymphatic: Normal caliber aorta and IVC. No specific abnormal lymph node enlargement present in the abdomen and pelvis. Reproductive: Enlarged prostate with lobular margins. Please correlate with patient's PSA. Other: No abdominal wall hernia or abnormality. No abdominopelvic ascites.  Musculoskeletal: Slight curvature of the spine with some degenerative change. Degenerative changes of the pelvis. Multilevel stenosis along the lumbar spine. Please correlate with any symptoms. IMPRESSION: No bowel obstruction, free air or free fluid. There are some fluid-filled loops of ileum, nonspecific. Few colonic diverticula. Bosniak 1 bilateral large renal cysts. Punctate nonobstructing bilateral renal stones. Enlarged prostate.  Please correlate with patient's PSA. Electronically Signed   By: AJill SideM.D.   On: 08/27/2022 17:26    PROCEDURES:  Critical Care performed: No  Procedures   MEDICATIONS ORDERED IN ED: Medications  sodium chloride 0.9 % bolus 1,000 mL (0 mLs Intravenous Stopped 08/27/22 1837)  ondansetron (ZOFRAN) injection 4 mg (4 mg Intravenous Given 08/27/22 1653)  iohexol (OMNIPAQUE) 300 MG/ML solution 100 mL (100 mLs Intravenous Contrast Given 08/27/22 1710)     IMPRESSION / MDM / ASSESSMENT AND PLAN / ED COURSE  I reviewed the triage vital signs and the nursing notes.  Differential diagnosis includes, but is not limited to, norovirus, flu, COVID, gastroenteritis, colitis, diverticulitis  Patient's presentation is most consistent with acute presentation with potential threat to life or bodily function.   Patient's diagnosis is consistent with norovirus.  Patient presents emergency department with sudden onset of nausea, vomiting, diarrhea.  Patient has no chronic GI problems.  Denies any recent sick contacts.  Patient had diffuse tenderness mid abdomen without palpable abnormality..  Elevated white blood cell count but otherwise reassuring labs.  Patient has reassuring CT scan.  At this time based off of symptoms, community prevalence I feel that patient likely has norovirus.  Patient will be prescribed symptom control medications of antiemetic, antidiarrheal and antispasmodic.  Follow-up primary care as needed.  Return precautions  discussed with the patient.  Patient is given ED precautions to return to the ED for any worsening or new symptoms.     FINAL CLINICAL IMPRESSION(S) / ED DIAGNOSES   Final diagnoses:  Norovirus     Rx / DC Orders   ED Discharge Orders          Ordered    ondansetron (ZOFRAN-ODT) 4 MG disintegrating tablet  Every 8 hours PRN        08/27/22 1917    loperamide (IMODIUM A-D) 2 MG tablet  4 times daily PRN        08/27/22 1917    dicyclomine (BENTYL) 10 MG capsule  3 times daily before meals & bedtime        08/27/22 1917             Note:  This document was prepared using Dragon voice recognition software and may include unintentional dictation errors.   Brynda Peon 08/27/22 Alvira Monday, MD 08/27/22 2112

## 2022-08-27 NOTE — ED Triage Notes (Signed)
Patient to ED via [facility] from home for N/V. States abd pain from vomiting. Patient unable to eat/drink or take his medications for approx 3 days. Hx of COPD  182 cbg 104 HR 93% RA 150/91 97.8 temp

## 2022-08-27 NOTE — ED Provider Triage Note (Signed)
Emergency Medicine Provider Triage Evaluation Note  Willie Crane , a 77 y.o. male  was evaluated in triage.  Pt complains of n/v x3 days. Has not had anything to eat or drink x3 days, nor take his meds. Reports abdominal pain in the center of his abdomen. No CP/SOB.   Review of Systems  Positive: N/v Negative: fever  Physical Exam  There were no vitals taken for this visit. Gen:   Awake, no distress  Resp:  Normal effort  MSK:   Moves extremities without difficulty  Other:    Medical Decision Making  Medically screening exam initiated at 2:40 PM.  Appropriate orders placed.  Willie Crane was informed that the remainder of the evaluation will be completed by another provider, this initial triage assessment does not replace that evaluation, and the importance of remaining in the ED until their evaluation is complete.     Marquette Old, PA-C 08/27/22 1443

## 2022-09-13 NOTE — Progress Notes (Signed)
Name: Willie Crane   MRN: [id number]    DOB: Feb 23, 1946   Date:09/14/2022       Progress Note  Subjective  Chief Complaint  Follow Up  HPI  Angina Pectoris/Atherosclerosis of aorta : he states possible MI after surgery at the [place], he was seen by Dr. Nehemiah Massed back in 2019, I cannot see the studies done at the time. He has been on medical management . He is on statin, ARB, beta blocker  and aspirin He is doing well at this time CT chest showed atherosclerosis of aorta back in 2018 .He is able to go up a flight of stairs without SOB or chest pain   COPD/ Asthma: he has been using Symbicort . He usually sees Dr. Mortimer Fries. He is using Ventolin very seldom now at most 4 times per month. Symptoms have been controlled lately, no wheezing, cough or SOB  Morbid obesity: weight is stable since last Summer , recent episode of noravirus but is now feeling back to normal, appetite is good, no nausea, vomiting or diarrhea. Discussed elevated WBC , he will return to recheck labs . BMI still above 35 with co-morbidities such as CAD/angina/COPD  Senile purpura: on arms , he is on aspirin. Stable and reassurance   BPH: Urologist has release him for their care We discussed not rechecking PSA due to his age but he wants to continue getting it checked yearly   BPPV: seen by ENT in the past and had Epley maneuver, he states no recent episodes   PTSD/Insomnia : he takes prozac and states it works well for him  no longer having symptoms with medications, he used to have nightmares about [place] . He has been taking seroquel since last Summer, it helps him fall and stay asleep.   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   Senile purpura ([place]) 06/21/2020   Insomnia 01/20/2020   PTSD (post-traumatic stress disorder) 02/10/2019   Coronary artery disease 06/30/2018   Essential hypertension 05/19/2018   Stable angina pectoris 05/19/2018   Stage 2  moderate COPD by GOLD classification ([place]) 02/12/2017   History of acute myocardial infarction 09/27/2016   Prolonged Q-T interval on ECG 09/27/2016   Obesity (BMI 30.0-34.9) 12/27/2015   Moderate persistent asthma 09/28/2015   BPH with obstruction/lower urinary tract symptoms 07/12/2015   Hydrocele, bilateral 07/12/2015   Hyperlipidemia     Past Surgical History:  Procedure Laterality Date   BACK SURGERY     CARDIAC CATHETERIZATION  03/2009   [facility]: No significant coronary artery disease   CARDIAC CATHETERIZATION  01/2014   [facility]   CARPAL TUNNEL RELEASE Right    CHOLECYSTECTOMY     COLONOSCOPY     COLONOSCOPY WITH PROPOFOL N/A 10/31/2015   Procedure: COLONOSCOPY WITH PROPOFOL;  Surgeon: Lucilla Lame, MD;  Location: [place];  Service: Endoscopy;  Laterality: N/A;   CYST EXCISION     CYSTOSCOPY W/ URETERAL STENT PLACEMENT Left 10/26/2016   Procedure: CYSTOSCOPY WITH STENT REPLACEMENT;  Surgeon: Nickie Retort, MD;  Location: [facility];  Service: Urology;  Laterality: Left;   CYSTOSCOPY WITH STENT PLACEMENT Left 10/05/2016   Procedure: CYSTOSCOPY WITH STENT PLACEMENT;  Surgeon: Nickie Retort, MD;  Location: [facility];  Service: Urology;  Laterality: Left;   HERNIA REPAIR     INGUINAL HERNIA REPAIR Right    INGUINAL HERNIA REPAIR Left 01/26/2016   Procedure: HERNIA REPAIR INGUINAL ADULT;  Surgeon: Christene Lye, MD;  Location: [facility];  Service: General;  Laterality: Left;   KNEE ARTHROSCOPY     LUMBAR LAMINECTOMY     POLYPECTOMY  10/31/2015   Procedure: POLYPECTOMY;  Surgeon: Lucilla Lame, MD;  Location: [place];  Service: Endoscopy;;   URETEROSCOPY Left 10/05/2016   Procedure: URETEROSCOPY;  Surgeon: Nickie Retort, MD;  Location: [facility];  Service: Urology;  Laterality: Left;   URETEROSCOPY WITH HOLMIUM LASER LITHOTRIPSY Left 10/26/2016   Procedure: URETEROSCOPY WITH HOLMIUM LASER LITHOTRIPSY;  Surgeon: Nickie Retort, MD;  Location: [facility];   Service: Urology;  Laterality: Left;   VASECTOMY      Family History  Problem Relation Age of Onset   Kidney failure Mother    Cancer Mother    Cancer Father        lung   Prostate cancer Neg Hx     Social History   Tobacco Use   Smoking status: Former    Packs/day: 1.00    Years: 20.00    Total pack years: 20.00    Types: Cigarettes    Quit date: 01/19/1979    Years since quitting: 43.6   Smokeless tobacco: Former    Types: Chew  Substance Use Topics   Alcohol use: Yes    Alcohol/week: 0.0 standard drinks of alcohol    Comment: Occasional Wine     Current Outpatient Medications:    albuterol (VENTOLIN HFA) 108 (90 Base) MCG/ACT inhaler, INHALE 2 PUFFS INTO THE LUNGS EVERY 4 (FOUR) HOURS AS NEEDED FOR WHEEZING OR SHORTNESS OF BREATH., Disp: 18 g, Rfl: 2   ASPIRIN LOW DOSE 81 MG tablet, TAKE 1 TABLET BY MOUTH EVERY DAY, Disp: 90 tablet, Rfl: 1   atorvastatin (LIPITOR) 20 MG tablet, TAKE 1 TABLET BY MOUTH EVERYDAY AT BEDTIME, Disp: 90 tablet, Rfl: 0   benzonatate (TESSALON PERLES) 100 MG capsule, Take 1-2 capsules (100-200 mg total) by mouth 3 (three) times daily as needed for cough., Disp: 40 capsule, Rfl: 0   budesonide-formoterol (SYMBICORT) 160-4.5 MCG/ACT inhaler, USE 2 INHALATIONS ORALLY   TWICE DAILY FOR CHRONIC    OBSTRUCTIVE PULMONARY      DISEASE, Disp: 30.6 g, Rfl: 3   dicyclomine (BENTYL) 10 MG capsule, Take 1 capsule (10 mg total) by mouth 4 (four) times daily -  before meals and at bedtime., Disp: 30 capsule, Rfl: 0   finasteride (PROSCAR) 5 MG tablet, TAKE 1 TABLET (5 MG TOTAL) BY MOUTH DAILY., Disp: 90 tablet, Rfl: 0   FLUoxetine (PROZAC) 20 MG capsule, TAKE 1 CAPSULE BY MOUTH EVERY DAY, Disp: 90 capsule, Rfl: 3   loperamide (IMODIUM A-D) 2 MG tablet, Take 1 tablet (2 mg total) by mouth 4 (four) times daily as needed for diarrhea or loose stools., Disp: 30 tablet, Rfl: 0   loperamide (IMODIUM) 2 MG capsule, Take by mouth 4 (four) times daily as needed., Disp: ,  Rfl:    metoprolol succinate (TOPROL-XL) 25 MG 24 hr tablet, TAKE 1 TABLET (25 MG TOTAL) BY MOUTH DAILY., Disp: 90 tablet, Rfl: 0   Multiple Vitamins-Minerals (MULTI ADULT GUMMIES) CHEW, Chew 1 tablet by mouth daily., Disp: , Rfl:    olmesartan (BENICAR) 40 MG tablet, TAKE 1 TABLET BY MOUTH EVERY DAY, Disp: 90 tablet, Rfl: 0   ondansetron (ZOFRAN-ODT) 4 MG disintegrating tablet, Take 1 tablet (4 mg total) by mouth every 8 (eight) hours as needed., Disp: 20 tablet, Rfl: 0   QUEtiapine (SEROQUEL) 25 MG tablet, Take  1 tablet (25 mg total) by mouth at bedtime. In place of seroquel for sleep, Disp: 90 tablet, Rfl: 1  Allergies  Allergen Reactions   Other Swelling    Lips swell - Potatoes, Kiwi, carrots, celery (raw)  general anesthesia - nausea     I personally reviewed active problem list, medication list, allergies, family history, social history, health maintenance with the patient/caregiver today.   ROS  Constitutional: Negative for fever or weight change.  Respiratory: Negative for cough and shortness of breath.   Cardiovascular: Negative for chest pain or palpitations.  Gastrointestinal: Negative for abdominal pain, no bowel changes.  Musculoskeletal: Negative for gait problem or joint swelling.  Skin: Negative for rash.  Neurological: Negative for dizziness or headache.  No other specific complaints in a complete review of systems (except as listed in HPI above).   Objective  Vitals:   09/14/22 1310  BP: 130/80  Pulse: 66  Resp: 18  Temp: (!) 97.5 F (36.4 C)  TempSrc: Oral  SpO2: 94%  Weight: 223 lb 8 oz (101.4 kg)  Height: 5' 7"$  (1.702 m)    Body mass index is 35.01 kg/m.  Physical Exam  Constitutional: Patient appears well-developed and well-nourished. Obese  No distress.  HEENT: head atraumatic, normocephalic, pupils equal and reactive to light,, neck supple, throat within normal limits Cardiovascular: Normal rate, regular rhythm and normal heart sounds.  No  murmur heard. No BLE edema. Pulmonary/Chest: Effort normal and breath sounds normal. No respiratory distress. Abdominal: Soft.  There is no tenderness. Psychiatric: Patient has a normal mood and affect. behavior is normal. Judgment and thought content normal.   PHQ2/9:    09/14/2022    1:16 PM 03/13/2022    1:38 PM 09/11/2021    1:06 PM 03/10/2021   11:43 AM 10/19/2020    2:54 PM  Depression screen PHQ 2/9  Decreased Interest 0 0 0 0 0  Down, Depressed, Hopeless 0 0 0 0 0  PHQ - 2 Score 0 0 0 0 0  Altered sleeping 0 2 0    Tired, decreased energy 0 1 0    Change in appetite 0 0 0    Feeling bad or failure about yourself  0 0 0    Trouble concentrating 0 0 0    Moving slowly or fidgety/restless 0 0 0    Suicidal thoughts 0 0 0    PHQ-9 Score 0 3 0    Difficult doing work/chores  Not difficult at all       phq 9 is negative   Fall Risk:    09/14/2022    1:11 PM 03/13/2022    1:24 PM 09/11/2021    1:06 PM 03/10/2021   11:43 AM 10/19/2020    2:54 PM  Fall Risk   Falls in the past year? 0 1 0 0 0  Number falls in past yr:  1 0 0 0  Injury with Fall?  0 0 0 0  Risk for fall due to : No Fall Risks No Fall Risks No Fall Risks    Follow up Falls prevention discussed;Education provided;Falls evaluation completed Falls prevention discussed Falls prevention discussed        Functional Status Survey: Is the patient deaf or have difficulty hearing?: Yes Does the patient have difficulty seeing, even when wearing glasses/contacts?: No Does the patient have difficulty concentrating, remembering, or making decisions?: No Does the patient have difficulty walking or climbing stairs?: No Does the patient have difficulty dressing or  bathing?: No Does the patient have difficulty doing errands alone such as visiting a doctor's office or shopping?: No    Assessment & Plan  1. Coronary artery disease involving native coronary artery of native heart with angina pectoris (HCC)  - Lipid panel -  metoprolol succinate (TOPROL-XL) 25 MG 24 hr tablet; Take 1 tablet (25 mg total) by mouth daily.  Dispense: 90 tablet; Refill: 1  Medically managed   2. COPD with asthma  Doing well on symbicort   3. Essential hypertension  - CBC with Differential/Platelet - olmesartan (BENICAR) 40 MG tablet; Take 1 tablet (40 mg total) by mouth daily.  Dispense: 90 tablet; Refill: 1 - COMPLETE METABOLIC PANEL WITH GFR  4. Senile purpura ([place])  Reassurance given   5. Fatty liver   6. Morbid obesity ([place])  Discussed with the patient the risk posed by an increased BMI. Discussed importance of portion control, calorie counting and at least 150 minutes of physical activity weekly. Avoid sweet beverages and drink more water. Eat at least 6 servings of fruit and vegetables daily    7. Insomnia, unspecified type  - QUEtiapine (SEROQUEL) 25 MG tablet; Take 1 tablet (25 mg total) by mouth at bedtime. In place of seroquel for sleep  Dispense: 90 tablet; Refill: 1  8. PSA elevation  - PSA  9. BPH with obstruction/lower urinary tract symptoms  - PSA - finasteride (PROSCAR) 5 MG tablet; Take 1 tablet (5 mg total) by mouth daily.  Dispense: 90 tablet; Refill: 1  10. Mixed hyperlipidemia  - atorvastatin (LIPITOR) 20 MG tablet; Take 1 tablet (20 mg total) by mouth daily.  Dispense: 90 tablet; Refill: 1  11. Chronic obstructive pulmonary disease, unspecified COPD type (HCC)  - budesonide-formoterol (SYMBICORT) 160-4.5 MCG/ACT inhaler; Inhale 2 puffs into the lungs 2 (two) times daily.  Dispense: 30.6 g; Refill: 1  12. PTSD (post-traumatic stress disorder)  - FLUoxetine (PROZAC) 20 MG capsule; Take 1 capsule (20 mg total) by mouth daily.  Dispense: 90 capsule; Refill: 1

## 2022-09-14 ENCOUNTER — Encounter: Payer: Self-pay | Admitting: Family Medicine

## 2022-09-14 ENCOUNTER — Ambulatory Visit: Payer: Federal, State, Local not specified - PPO | Admitting: Family Medicine

## 2022-09-14 VITALS — BP 130/80 | HR 66 | Temp 97.5°F | Resp 18 | Ht 67.0 in | Wt 223.5 lb

## 2022-09-14 DIAGNOSIS — J4489 Other specified chronic obstructive pulmonary disease: Secondary | ICD-10-CM | POA: Diagnosis not present

## 2022-09-14 DIAGNOSIS — N401 Enlarged prostate with lower urinary tract symptoms: Secondary | ICD-10-CM

## 2022-09-14 DIAGNOSIS — D692 Other nonthrombocytopenic purpura: Secondary | ICD-10-CM

## 2022-09-14 DIAGNOSIS — I2089 Other forms of angina pectoris: Secondary | ICD-10-CM

## 2022-09-14 DIAGNOSIS — K760 Fatty (change of) liver, not elsewhere classified: Secondary | ICD-10-CM

## 2022-09-14 DIAGNOSIS — G4700 Insomnia, unspecified: Secondary | ICD-10-CM

## 2022-09-14 DIAGNOSIS — E782 Mixed hyperlipidemia: Secondary | ICD-10-CM

## 2022-09-14 DIAGNOSIS — F4310 Post-traumatic stress disorder, unspecified: Secondary | ICD-10-CM

## 2022-09-14 DIAGNOSIS — I10 Essential (primary) hypertension: Secondary | ICD-10-CM | POA: Diagnosis not present

## 2022-09-14 DIAGNOSIS — N138 Other obstructive and reflux uropathy: Secondary | ICD-10-CM

## 2022-09-14 DIAGNOSIS — I25119 Atherosclerotic heart disease of native coronary artery with unspecified angina pectoris: Secondary | ICD-10-CM

## 2022-09-14 DIAGNOSIS — J449 Chronic obstructive pulmonary disease, unspecified: Secondary | ICD-10-CM

## 2022-09-14 DIAGNOSIS — R972 Elevated prostate specific antigen [PSA]: Secondary | ICD-10-CM

## 2022-09-14 MED ORDER — FLUOXETINE HCL 20 MG PO CAPS: 20.0000 mg | ORAL_CAPSULE | Freq: Every day | ORAL | 1 refills | Status: DC

## 2022-09-14 MED ORDER — ATORVASTATIN CALCIUM 20 MG PO TABS: 20.0000 mg | ORAL_TABLET | Freq: Every day | ORAL | 1 refills | Status: DC

## 2022-09-14 MED ORDER — METOPROLOL SUCCINATE ER 25 MG PO TB24: 25.0000 mg | ORAL_TABLET | Freq: Every day | ORAL | 1 refills | Status: DC

## 2022-09-14 MED ORDER — QUETIAPINE FUMARATE 25 MG PO TABS: 25.0000 mg | ORAL_TABLET | Freq: Every day | ORAL | 1 refills | Status: DC

## 2022-09-14 MED ORDER — FINASTERIDE 5 MG PO TABS: 5.0000 mg | ORAL_TABLET | Freq: Every day | ORAL | 1 refills | Status: DC

## 2022-09-14 MED ORDER — BUDESONIDE-FORMOTEROL FUMARATE 160-4.5 MCG/ACT IN AERO: 2.0000 | INHALATION_SPRAY | Freq: Two times a day (BID) | RESPIRATORY_TRACT | 1 refills | Status: DC

## 2022-09-14 MED ORDER — OLMESARTAN MEDOXOMIL 40 MG PO TABS: 40.0000 mg | ORAL_TABLET | Freq: Every day | ORAL | 1 refills | Status: DC

## 2022-09-26 ENCOUNTER — Telehealth: Payer: Self-pay | Admitting: Family Medicine

## 2022-09-26 NOTE — Telephone Encounter (Signed)
Copied from [place] [phone]. Topic: General - Inquiry >> Sep 26, 2022 10:14 AM Erskine Squibb wrote: Reason for CRM: The patient called in stating he received a message from [place] to call the office. He said it maybe regarding a medication. Please assist patient further

## 2023-01-24 ENCOUNTER — Encounter: Payer: Self-pay | Admitting: Student in an Organized Health Care Education/Training Program

## 2023-01-24 ENCOUNTER — Ambulatory Visit
Payer: Federal, State, Local not specified - PPO | Admitting: Student in an Organized Health Care Education/Training Program

## 2023-01-24 VITALS — BP 130/88 | HR 100 | Temp 97.7°F | Ht 67.0 in | Wt 222.6 lb

## 2023-01-24 DIAGNOSIS — G4733 Obstructive sleep apnea (adult) (pediatric): Secondary | ICD-10-CM

## 2023-01-24 DIAGNOSIS — J4540 Moderate persistent asthma, uncomplicated: Secondary | ICD-10-CM | POA: Diagnosis not present

## 2023-01-24 DIAGNOSIS — J449 Chronic obstructive pulmonary disease, unspecified: Secondary | ICD-10-CM | POA: Diagnosis not present

## 2023-01-24 MED ORDER — ALBUTEROL SULFATE HFA 108 (90 BASE) MCG/ACT IN AERS: 2.0000 | INHALATION_SPRAY | RESPIRATORY_TRACT | 6 refills | Status: AC | PRN

## 2023-01-24 MED ORDER — AEROCHAMBER MV MISC: 0 refills | Status: AC

## 2023-01-24 MED ORDER — BUDESONIDE-FORMOTEROL FUMARATE 160-4.5 MCG/ACT IN AERO: 2.0000 | INHALATION_SPRAY | Freq: Two times a day (BID) | RESPIRATORY_TRACT | 12 refills | Status: AC

## 2023-01-24 NOTE — Progress Notes (Signed)
Synopsis: Referred in for Asthma by Alba Cory, MD  Assessment & Plan:   #Moderate persistent asthma without complication  History of asthma, and on exam does have wheezing at end expiration. Presenting with poorly controlled symptoms secondary to combination of poor inhaler technique and recent exposure to cat dander. I have reviewed inhaler technique with mr. Hagg and showed him how to properly use his [place]. Will send a spacer device as well as a refill on his [place].  Previous in-house spirometry had shown obstruction and I will hold off on repeating PFT's at the moment. Will repeat PFT's on follow up after he's had his Symbicort for a period of time. - Spacer/Aero-Holding Chambers (AEROCHAMBER MV) inhaler; Use as instructed  Dispense: 1 each; Refill: 0 - albuterol (VENTOLIN HFA) 108 (90 Base) MCG/ACT inhaler; Inhale 2 puffs into the lungs every 4 (four) hours as needed for wheezing or shortness of breath.  Dispense: 18 g; Refill: 6 - budesonide-formoterol 160-4.5 MCG/ACT inhaler; Inhale 2 puffs into the lungs 2 (two) times daily.  Dispense: 10.2 g; Refill: 12  #OSA (obstructive sleep apnea)  Does have notable weight gain and parasomnias concerning for sleep apnea. Will send a home sleep test to assess.  - Home sleep test; Future   Return in about 3 months (around 04/26/2023).  I spent 60 minutes caring for this patient today, including preparing to see the patient, obtaining a medical history , reviewing a separately obtained history, performing a medically appropriate examination and/or evaluation, counseling and educating the patient/family/caregiver, ordering medications, tests, or procedures, documenting clinical information in the electronic health record, and independently interpreting results (not separately reported/billed) and communicating results to the patient/family/caregiver  Raechel Chute, MD [facility] 01/24/2023 10:52 AM    End of visit  medications:  Meds ordered this encounter  Medications   Spacer/Aero-Holding Chambers (AEROCHAMBER MV) inhaler    Sig: Use as instructed    Dispense:  1 each    Refill:  0   albuterol (VENTOLIN HFA) 108 (90 Base) MCG/ACT inhaler    Sig: Inhale 2 puffs into the lungs every 4 (four) hours as needed for wheezing or shortness of breath.    Dispense:  18 g    Refill:  6   budesonide-formoterol (SYMBICORT) 160-4.5 MCG/ACT inhaler    Sig: Inhale 2 puffs into the lungs 2 (two) times daily.    Dispense:  10.2 g    Refill:  12     Current Outpatient Medications:    ASPIRIN LOW DOSE 81 MG tablet, TAKE 1 TABLET BY MOUTH EVERY DAY, Disp: 90 tablet, Rfl: 1   atorvastatin (LIPITOR) 20 MG tablet, Take 1 tablet (20 mg total) by mouth daily., Disp: 90 tablet, Rfl: 1   dicyclomine (BENTYL) 10 MG capsule, Take 1 capsule (10 mg total) by mouth 4 (four) times daily -  before meals and at bedtime., Disp: 30 capsule, Rfl: 0   finasteride (PROSCAR) 5 MG tablet, Take 1 tablet (5 mg total) by mouth daily., Disp: 90 tablet, Rfl: 1   FLUoxetine (PROZAC) 20 MG capsule, Take 1 capsule (20 mg total) by mouth daily., Disp: 90 capsule, Rfl: 1   loperamide (IMODIUM) 2 MG capsule, Take by mouth 4 (four) times daily as needed., Disp: , Rfl:    metoprolol succinate (TOPROL-XL) 25 MG 24 hr tablet, Take 1 tablet (25 mg total) by mouth daily., Disp: 90 tablet, Rfl: 1   Multiple Vitamins-Minerals (MULTI ADULT GUMMIES) CHEW, Chew 1 tablet by mouth  daily., Disp: , Rfl:    olmesartan (BENICAR) 40 MG tablet, Take 1 tablet (40 mg total) by mouth daily., Disp: 90 tablet, Rfl: 1   ondansetron (ZOFRAN-ODT) 4 MG disintegrating tablet, Take 1 tablet (4 mg total) by mouth every 8 (eight) hours as needed., Disp: 20 tablet, Rfl: 0   QUEtiapine (SEROQUEL) 25 MG tablet, Take 1 tablet (25 mg total) by mouth at bedtime. In place of seroquel for sleep, Disp: 90 tablet, Rfl: 1   Spacer/Aero-Holding Chambers (AEROCHAMBER MV) inhaler, Use as  instructed, Disp: 1 each, Rfl: 0   albuterol (VENTOLIN HFA) 108 (90 Base) MCG/ACT inhaler, Inhale 2 puffs into the lungs every 4 (four) hours as needed for wheezing or shortness of breath., Disp: 18 g, Rfl: 6   budesonide-formoterol (SYMBICORT) 160-4.5 MCG/ACT inhaler, Inhale 2 puffs into the lungs 2 (two) times daily., Disp: 10.2 g, Rfl: 12   Subjective:   PATIENT ID: Willie Crane GENDER: male DOB: [birth year], MRN: [id number]  Chief Complaint  Patient presents with   pulmonary consult    Hx of COPD- SOB with exertion, dry cough at times prod with thick clear sputum and wheezing.     HPI  Pleasant 77 year old male presenting to clinic for the evaluation of shortness of breath.  Patient has a history of asthma previously seen and followed by Dr. Belia Heman.  He had been maintained on Symbicort for many years which he felt had provided adequate control of symptoms.  He has been having increased shortness of breath, cough, wheezing, sputum production over the past months requiring him to use his albuterol inhaler very frequently (every 2 hours).  He has run out of his inhalers and is unable to get a refill hence presenting to our clinic to re-establish care.  Reports some parasomnias where he feels he is half-asleep and unable to breathe.  His partner is unable to tell if he snores and or if he stops breathing at night.  He has gained a good amount of weight since retiring from the [employer].  Patient is originally from [place], had previously smoked cigarettes for around 20-25 years (1 pack a day).  He was in the service for a couple years in [place] with some exposure to agent orange and some burn pits.  He felt his symptoms really started a year after he came back from [place].  He has been getting care for over 30 to 40 years for his asthma.  He has 2 daughters with asthma.  A few months ago a cat has entered their lives and follows him around.  Ancillary information including prior  medications, full medical/surgical/family/social histories, and PFTs (when available) are listed below and have been reviewed.   Review of Systems  Constitutional:  Negative for chills, fever and weight loss (weight gain).  Respiratory:  Positive for cough, sputum production, shortness of breath and wheezing. Negative for hemoptysis.   Cardiovascular:  Negative for chest pain and palpitations.     Objective:   Vitals:   01/24/23 1006  BP: 130/88  Pulse: 100  Temp: 97.7 F (36.5 C)  TempSrc: Temporal  SpO2: 94%  Weight: 222 lb 9.6 oz (101 kg)  Height: 5\' 7"  (1.702 m)   94% on RA BMI Readings from Last 3 Encounters:  01/24/23 34.86 kg/m  09/14/22 35.01 kg/m  08/27/22 31.32 kg/m   Wt Readings from Last 3 Encounters:  01/24/23 222 lb 9.6 oz (101 kg)  09/14/22 223 lb 8 oz (101.4 kg)  08/27/22 200  lb (90.7 kg)    Physical Exam Constitutional:      Appearance: Normal appearance.  Cardiovascular:     Rate and Rhythm: Normal rate and regular rhythm.     Pulses: Normal pulses.  Pulmonary:     Breath sounds: Wheezing (elicited on forced expiration at the end of the breath) present.  Abdominal:     General: There is distension.     Palpations: Abdomen is soft.  Neurological:     General: No focal deficit present.     Mental Status: He is alert and oriented to person, place, and time. Mental status is at baseline.       Ancillary Information    Past Medical History:  Diagnosis Date   Anxiety    Asthma    Benign neoplasm of transverse colon    Benign paroxysmal positional vertigo    BPH (benign prostatic hypertrophy)    Complication of anesthesia    COPD (chronic obstructive pulmonary disease) (HCC)    Coronary artery disease    history of heart attack   Degeneration of lumbar or lumbosacral intervertebral disc    History of kidney stones    Hyperlipidemia    Hypertension    MI (myocardial infarction) (HCC)    1980s   Nephrolithiasis 09/28/2016   Obesity,  unspecified    PONV (postoperative nausea and vomiting)    PTSD (post-traumatic stress disorder)    Renal disorder    kidney stones   Thoracic or lumbosacral neuritis or radiculitis, unspecified    Unspecified congenital anomaly of the integument    Unspecified gastritis and gastroduodenitis without mention of hemorrhage      Family History  Problem Relation Age of Onset   Kidney failure Mother    Cancer Mother    Cancer Father        lung   Prostate cancer Neg Hx      Past Surgical History:  Procedure Laterality Date   BACK SURGERY     CARDIAC CATHETERIZATION  03/2009   [facility]: No significant coronary artery disease   CARDIAC CATHETERIZATION  01/2014   [facility]   CARPAL TUNNEL RELEASE Right    CHOLECYSTECTOMY     COLONOSCOPY     COLONOSCOPY WITH PROPOFOL N/A 10/31/2015   Procedure: COLONOSCOPY WITH PROPOFOL;  Surgeon: Midge Minium, MD;  Location: [facility];  Service: Endoscopy;  Laterality: N/A;   CYST EXCISION     CYSTOSCOPY W/ URETERAL STENT PLACEMENT Left 10/26/2016   Procedure: CYSTOSCOPY WITH STENT REPLACEMENT;  Surgeon: Hildred Laser, MD;  Location: [facility];  Service: Urology;  Laterality: Left;   CYSTOSCOPY WITH STENT PLACEMENT Left 10/05/2016   Procedure: CYSTOSCOPY WITH STENT PLACEMENT;  Surgeon: Hildred Laser, MD;  Location: [facility];  Service: Urology;  Laterality: Left;   HERNIA REPAIR     INGUINAL HERNIA REPAIR Right    INGUINAL HERNIA REPAIR Left 01/26/2016   Procedure: HERNIA REPAIR INGUINAL ADULT;  Surgeon: Kieth Brightly, MD;  Location: [facility];  Service: General;  Laterality: Left;   KNEE ARTHROSCOPY     LUMBAR LAMINECTOMY     POLYPECTOMY  10/31/2015   Procedure: POLYPECTOMY;  Surgeon: Midge Minium, MD;  Location[facility];  Service: Endoscopy;;   URETEROSCOPY Left 10/05/2016   Procedure: URETEROSCOPY;  Surgeon: Hildred Laser, MD;  Location: [facility];  Service: Urology;  Laterality: Left;   URETEROSCOPY WITH HOLMIUM LASER  LITHOTRIPSY Left 10/26/2016   Procedure: URETEROSCOPY WITH HOLMIUM LASER LITHOTRIPSY;  Surgeon:  Hildred Laser, MD;  Location: [facility];  Service: Urology;  Laterality: Left;   VASECTOMY      Social History   Socioeconomic History   Marital status: Married    Spouse name: Not on file   Number of children: Not on file   Years of education: Not on file   Highest education level: Not on file  Occupational History   Not on file  Tobacco Use   Smoking status: Former    Packs/day: 1.00    Years: 20.00    Additional pack years: 0.00    Total pack years: 20.00    Types: Cigarettes    Quit date: 01/19/1979    Years since quitting: 44.0   Smokeless tobacco: Former    Types: Associate Professor Use: Never used  Substance and Sexual Activity   Alcohol use: Yes    Alcohol/week: 0.0 standard drinks of alcohol    Comment: Occasional Wine   Drug use: No   Sexual activity: Yes  Other Topics Concern   Not on file  Social History Narrative   Not on file   Social Determinants of Health   Financial Resource Strain: Not on file  Food Insecurity: Not on file  Transportation Needs: Not on file  Physical Activity: Not on file  Stress: Not on file  Social Connections: Not on file  Intimate Partner Violence: Not on file     Allergies  Allergen Reactions   Other Swelling    Lips swell - Potatoes, Kiwi, carrots, celery (raw)  general anesthesia - nausea      CBC    Component Value Date/Time   WBC 17.0 (H) 08/27/2022 1450   RBC 5.81 08/27/2022 1450   HGB 17.6 (H) 08/27/2022 1450   HGB 15.1 01/04/2016 1144   HCT 52.0 08/27/2022 1450   HCT 44.9 01/04/2016 1144   PLT 246 08/27/2022 1450   PLT 221 01/04/2016 1144   MCV 89.5 08/27/2022 1450   MCV 88 01/04/2016 1144   MCH 30.3 08/27/2022 1450   MCHC 33.8 08/27/2022 1450   RDW 12.6 08/27/2022 1450   RDW 13.2 01/04/2016 1144   LYMPHSABS 0.4 (L) 08/27/2022 1450   LYMPHSABS 1.7 01/04/2016 1144   MONOABS 0.7 08/27/2022  1450   EOSABS 0.0 08/27/2022 1450   EOSABS 0.3 01/04/2016 1144   BASOSABS 0.0 08/27/2022 1450   BASOSABS 0.0 01/04/2016 1144    Pulmonary Functions Testing Results:     No data to display          Outpatient Medications Prior to Visit  Medication Sig Dispense Refill   ASPIRIN LOW DOSE 81 MG tablet TAKE 1 TABLET BY MOUTH EVERY DAY 90 tablet 1   atorvastatin (LIPITOR) 20 MG tablet Take 1 tablet (20 mg total) by mouth daily. 90 tablet 1   dicyclomine (BENTYL) 10 MG capsule Take 1 capsule (10 mg total) by mouth 4 (four) times daily -  before meals and at bedtime. 30 capsule 0   finasteride (PROSCAR) 5 MG tablet Take 1 tablet (5 mg total) by mouth daily. 90 tablet 1   FLUoxetine (PROZAC) 20 MG capsule Take 1 capsule (20 mg total) by mouth daily. 90 capsule 1   loperamide (IMODIUM) 2 MG capsule Take by mouth 4 (four) times daily as needed.     metoprolol succinate (TOPROL-XL) 25 MG 24 hr tablet Take 1 tablet (25 mg total) by mouth daily. 90 tablet 1   Multiple Vitamins-Minerals (MULTI ADULT GUMMIES)  CHEW Chew 1 tablet by mouth daily.     olmesartan (BENICAR) 40 MG tablet Take 1 tablet (40 mg total) by mouth daily. 90 tablet 1   ondansetron (ZOFRAN-ODT) 4 MG disintegrating tablet Take 1 tablet (4 mg total) by mouth every 8 (eight) hours as needed. 20 tablet 0   QUEtiapine (SEROQUEL) 25 MG tablet Take 1 tablet (25 mg total) by mouth at bedtime. In place of seroquel for sleep 90 tablet 1   albuterol (VENTOLIN HFA) 108 (90 Base) MCG/ACT inhaler INHALE 2 PUFFS INTO THE LUNGS EVERY 4 (FOUR) HOURS AS NEEDED FOR WHEEZING OR SHORTNESS OF BREATH. 18 g 2   budesonide-formoterol (SYMBICORT) 160-4.5 MCG/ACT inhaler Inhale 2 puffs into the lungs 2 (two) times daily. 30.6 g 1   No facility-administered medications prior to visit.

## 2023-03-01 ENCOUNTER — Other Ambulatory Visit: Payer: Self-pay | Admitting: Family Medicine

## 2023-03-01 DIAGNOSIS — I2089 Other forms of angina pectoris: Secondary | ICD-10-CM

## 2023-03-01 NOTE — Telephone Encounter (Signed)
Requested Prescriptions  Pending Prescriptions Disp Refills   ASPIRIN LOW DOSE 81 MG tablet [Pharmacy Med Name: ASPIRIN EC 81 MG TABLET] 90 tablet 1    Sig: TAKE 1 TABLET BY MOUTH EVERY DAY     Analgesics:  NSAIDS - aspirin Passed - 03/01/2023  4:48 PM      Passed - Cr in normal range and within 360 days    Creat  Date Value Ref Range Status  09/11/2021 0.94 0.70 - 1.28 mg/dL Final   Creatinine, Ser  Date Value Ref Range Status  08/27/2022 1.12 0.61 - 1.24 mg/dL Final         Passed - eGFR is 10 or above and within 360 days    GFR, Est African American  Date Value Ref Range Status  10/19/2020 78 > OR = 60 mL/min/1.43m2 Final   GFR, Est Non African American  Date Value Ref Range Status  10/19/2020 68 > OR = 60 mL/min/1.24m2 Final   GFR, Estimated  Date Value Ref Range Status  08/27/2022 >60 >60 mL/min Final    Comment:    (NOTE) Calculated using the CKD-EPI Creatinine Equation (2021)    eGFR  Date Value Ref Range Status  09/11/2021 84 > OR = 60 mL/min/1.85m2 Final    Comment:    The eGFR is based on the CKD-EPI 2021 equation. To calculate  the new eGFR from a previous Creatinine or Cystatin C result, go to https://www.kidney.org/professionals/ kdoqi/gfr%5Fcalculator          Passed - Patient is not pregnant      Passed - Valid encounter within last 12 months    Recent Outpatient Visits           5 months ago Coronary artery disease involving native coronary artery of native heart with angina pectoris [facility])   [facility] Alba Cory, MD   11 months ago Stable angina pectoris [facility])   [facility] Alba Cory, MD   1 year ago Stable angina pectoris [facility])   [facility] Alba Cory, MD   1 year ago Coronary artery disease involving native coronary artery of native heart with angina pectoris [facility])   [facility] Alba Cory, MD   2 years ago COPD  with asthma [facility])   [facility] Alba Cory, MD       Future Appointments             In 2 weeks Alba Cory, MD [facility]   In 2 months Raechel Chute, MD [facility]

## 2023-03-18 NOTE — Progress Notes (Unsigned)
Name: Willie Crane   MRN: [id number]    DOB: 1945/10/31   Date:03/19/2023       Progress Note  Subjective  Chief Complaint  Follow Up  HPI  Angina Pectoris/Atherosclerosis of aorta : he states possible MI after surgery at the [place], he was seen by Dr. Gwen Pounds back in 2019, I cannot see the studies done at the time. He has been on medical management . He is on statin, ARB, beta blocker and aspirin He is doing well at this time CT chest showed atherosclerosis of aorta back in 2018 .He denies SOB or chest pain even with moderate level of activity    COPD/ Asthma: he has been using Symbicort . Under the care of pulmonologist He is using Ventolin very seldom now at most 4 times per month. Symptoms have been controlled lately, no wheezing, cough or SOB  Morbid obesity: weight is stable since last Summe 2023 . BMI still above 35 with co-morbidities such as CAD/angina/COPD, discussed portion control for now   Senile purpura: on arms , he is on aspirin.  Reassurance given   BPH: Urologist has release him for their care We discussed not rechecking PSA due to his age but he wants to continue getting it checked yearly . Taking Proscar and has good urine flow   PTSD/Insomnia : he takes prozac and states it works well for him  no longer having symptoms with medications, he used to have nightmares about [place] . He has been taking seroquel , initially he felt like it helped but is taking a long time to fall asleep now , advised to take seroquel with dinner also discussed sleep hygiene   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   Senile purpura (HCC) 06/21/2020   Insomnia 01/20/2020   PTSD (post-traumatic stress disorder) 02/10/2019   Coronary artery disease 06/30/2018   Essential hypertension 05/19/2018   Stable angina pectoris 05/19/2018   Stage 2 moderate COPD by GOLD classification (HCC) 02/12/2017   History of acute myocardial  infarction 09/27/2016   Prolonged Q-T interval on ECG 09/27/2016   Obesity (BMI 30.0-34.9) 12/27/2015   Moderate persistent asthma 09/28/2015   BPH with obstruction/lower urinary tract symptoms 07/12/2015   Hydrocele, bilateral 07/12/2015   Hyperlipidemia     Past Surgical History:  Procedure Laterality Date   BACK SURGERY     CARDIAC CATHETERIZATION  03/2009   [facility]: No significant coronary artery disease   C[facility]ATION  [facility]/2014   [facility]   CARPAL TUNNEL RELEASE Right    CHOLECYSTECTOMY     COLONOSCOPY     COLONOSCOPY WITH PROPOFOL N/A 10/31/2015   Procedure: COLONOSCOPY WITH PROPOFOL;  Surgeon: Midge Minium, MD;  Location: [facility];  Service: Endoscopy;  Laterality: N/A;   CYST EXCISION     CYSTOSCOPY W/ URETERAL STENT PLACEMENT Left 10/26/2016   Procedure: CYSTOSCOPY WITH STENT REPLACEMENT;  Surgeon: Hildred Laser, MD;  Location: [facility];  Service: Urology;  Laterality: Left;   CYSTOSCOPY WITH STENT PLACEMENT Left 10/05/2016   Procedure: CYSTOSCOPY WITH STENT PLACEMENT;  Surgeon: Hildred Laser, MD;  Location: [facility];  Service: Urology;  Laterality: Left;   HERNIA REPAIR     INGUINAL HERNIA REPAIR Right    INGUINAL HERNIA REPAIR Left 01/26/2016   Procedure: HERNIA REPAIR INGUINAL ADULT;  Surgeon: Kieth Brightly, MD;  Location: [facility];  Service: General;  Laterality: Left;  KNEE ARTHROSCOPY     LUMBAR LAMINECTOMY     POLYPECTOMY  10/31/2015   Procedure: POLYPECTOMY;  Surgeon: Midge Minium, MD;  Location: [facility];  Service: Endoscopy;;   URETEROSCOPY Left 10/05/2016   Procedure: URETEROSCOPY;  Surgeon: Hildred Laser, MD;  Location: [facility];  Service: Urology;  Laterality: Left;   URETEROSCOPY WITH HOLMIUM LASER LITHOTRIPSY Left 10/26/2016   Procedure: URETEROSCOPY WITH HOLMIUM LASER LITHOTRIPSY;  Surgeon: Hildred Laser, MD;  Location: [facility];  Service: Urology;  Laterality: Left;   VASECTOMY      Family History  Problem  Relation Age of Onset   Kidney failure Mother    Cancer Mother    Cancer Father        lung   Prostate cancer Neg Hx     Social History   Tobacco Use   Smoking status: Former    Current packs/day: 0.00    Average packs/day: 1 pack/day for 20.0 years (20.0 ttl pk-yrs)    Types: Cigarettes    Start date: 01/19/1959    Quit date: 01/19/1979    Years since quitting: 44.1   Smokeless tobacco: Former    Types: Chew  Substance Use Topics   Alcohol use: Yes    Alcohol/week: 0.0 standard drinks of alcohol    Comment: Occasional Wine     Current Outpatient Medications:    albuterol (VENTOLIN HFA) 108 (90 Base) MCG/ACT inhaler, Inhale 2 puffs into the lungs every 4 (four) hours as needed for wheezing or shortness of breath., Disp: 18 g, Rfl: 6   ASPIRIN LOW DOSE 81 MG tablet, TAKE 1 TABLET BY MOUTH EVERY DAY, Disp: 90 tablet, Rfl: 1   budesonide-formoterol (SYMBICORT) 160-4.5 MCG/ACT inhaler, Inhale 2 puffs into the lungs 2 (two) times daily., Disp: 10.2 g, Rfl: 12   dicyclomine (BENTYL) 10 MG capsule, Take 1 capsule (10 mg total) by mouth 4 (four) times daily -  before meals and at bedtime., Disp: 30 capsule, Rfl: 0   loperamide (IMODIUM) 2 MG capsule, Take by mouth 4 (four) times daily as needed., Disp: , Rfl:    Multiple Vitamins-Minerals (MULTI ADULT GUMMIES) CHEW, Chew 1 tablet by mouth daily., Disp: , Rfl:    olmesartan (BENICAR) 40 MG tablet, Take 1 tablet (40 mg total) by mouth daily., Disp: 90 tablet, Rfl: 1   Spacer/Aero-Holding Chambers (AEROCHAMBER MV) inhaler, Use as instructed, Disp: 1 each, Rfl: 0   atorvastatin (LIPITOR) 20 MG tablet, Take 1 tablet (20 mg total) by mouth daily., Disp: 90 tablet, Rfl: 2   finasteride (PROSCAR) 5 MG tablet, Take 1 tablet (5 mg total) by mouth daily., Disp: 90 tablet, Rfl: 2   FLUoxetine (PROZAC) 20 MG capsule, Take 1 capsule (20 mg total) by mouth daily., Disp: 90 capsule, Rfl: 2   metoprolol succinate (TOPROL-XL) 25 MG 24 hr tablet, Take 1  tablet (25 mg total) by mouth daily., Disp: 90 tablet, Rfl: 2   QUEtiapine (SEROQUEL) 25 MG tablet, Take 1 tablet (25 mg total) by mouth at bedtime. In place of seroquel for sleep, Disp: 90 tablet, Rfl: 2  Allergies  Allergen Reactions   Other Swelling    Lips swell - Potatoes, Kiwi, carrots, celery (raw)  general anesthesia - nausea     I personally reviewed active problem list, medication list, allergies, family history, social history, health maintenance with the patient/caregiver today.   ROS  Ten systems reviewed and is negative except as mentioned in HPI    Objective  Vitals:   03/19/23 1352  BP: 128/84  Pulse: 75  Resp: 18  Temp: 97.6 F (36.4 C)  TempSrc: Oral  SpO2: 94%  Weight: 226 lb 3.2 oz (102.6 kg)  Height: 5\' 7"  (1.702 m)    Body mass index is 35.43 kg/m.  Physical Exam  Constitutional: Patient appears well-developed and well-nourished. Obese  No distress.  HEENT: head atraumatic, normocephalic, pupils equal and reactive to light, neck supple Cardiovascular: Normal rate, regular rhythm and normal heart sounds.  No murmur heard. No BLE edema. Pulmonary/Chest: Effort normal and breath sounds normal. No respiratory distress. Abdominal: Soft.  There is no tenderness. Psychiatric: Patient has a normal mood and affect. behavior is normal. Judgment and thought content normal.    PHQ2/9:    03/19/2023    1:54 PM 09/14/2022    1:16 PM 03/13/2022    1:38 PM 09/11/2021    1:06 PM 03/10/2021   11:43 AM  Depression screen PHQ 2/9  Decreased Interest 0 0 0 0 0  Down, Depressed, Hopeless 0 0 0 0 0  PHQ - 2 Score 0 0 0 0 0  Altered sleeping 0 0 2 0   Tired, decreased energy 0 0 1 0   Change in appetite 0 0 0 0   Feeling bad or failure about yourself  0 0 0 0   Trouble concentrating 0 0 0 0   Moving slowly or fidgety/restless 0 0 0 0   Suicidal thoughts 0 0 0 0   PHQ-9 Score 0 0 3 0   Difficult doing work/chores   Not difficult at all      phq 9 is  negative   Fall Risk:    03/19/2023    1:54 PM 09/14/2022    1:11 PM 03/13/2022    1:24 PM 09/11/2021    1:06 PM 03/10/2021   11:43 AM  Fall Risk   Falls in the past year? 1 0 1 0 0  Number falls in past yr: 0  1 0 0  Injury with Fall? 0  0 0 0  Risk for fall due to : History of fall(s) No Fall Risks No Fall Risks No Fall Risks   Follow up Falls prevention discussed;Education provided;Falls evaluation completed Falls prevention discussed;Education provided;Falls evaluation completed Falls prevention discussed Falls prevention discussed     Assessment & Plan  1. Senile purpura (HCC)  Reassurance given   2. Coronary artery disease involving native coronary artery of native heart with angina pectoris (HCC)  - metoprolol succinate (TOPROL-XL) 25 MG 24 hr tablet; Take 1 tablet (25 mg total) by mouth daily.  Dispense: 90 tablet; Refill: 2  3. Essential hypertension  BP is at goal   4. Morbid obesity (HCC)  Discussed with the patient the risk posed by an increased BMI. Discussed importance of portion control, calorie counting and at least 150 minutes of physical activity weekly. Avoid sweet beverages and drink more water. Eat at least 6 servings of fruit and vegetables daily    5. Chronic obstructive pulmonary disease, unspecified COPD type (HCC)  On medication   6. BPH with obstruction/lower urinary tract symptoms  - finasteride (PROSCAR) 5 MG tablet; Take 1 tablet (5 mg total) by mouth daily.  Dispense: 90 tablet; Refill: 2  7. Fatty liver  Recheck labs   8. Moderate persistent asthma without complication  Stable   9. Mixed hyperlipidemia  - atorvastatin (LIPITOR) 20 MG tablet; Take 1 tablet (20 mg total) by mouth daily.  Dispense: 90 tablet; Refill: 2  10. PTSD (post-traumatic stress disorder)  - FLUoxetine (PROZAC) 20 MG capsule; Take 1 capsule (20 mg total) by mouth daily.  Dispense: 90 capsule; Refill: 2  11. Insomnia, unspecified type  - QUEtiapine (SEROQUEL) 25  MG tablet; Take 1 tablet (25 mg total) by mouth at bedtime. In place of seroquel for sleep  Dispense: 90 tablet; Refill: 2

## 2023-03-19 ENCOUNTER — Ambulatory Visit: Payer: Federal, State, Local not specified - PPO | Admitting: Family Medicine

## 2023-03-19 ENCOUNTER — Encounter: Payer: Self-pay | Admitting: Family Medicine

## 2023-03-19 VITALS — BP 128/84 | HR 75 | Temp 97.6°F | Resp 18 | Ht 67.0 in | Wt 226.2 lb

## 2023-03-19 DIAGNOSIS — K760 Fatty (change of) liver, not elsewhere classified: Secondary | ICD-10-CM

## 2023-03-19 DIAGNOSIS — N138 Other obstructive and reflux uropathy: Secondary | ICD-10-CM

## 2023-03-19 DIAGNOSIS — J449 Chronic obstructive pulmonary disease, unspecified: Secondary | ICD-10-CM

## 2023-03-19 DIAGNOSIS — I25119 Atherosclerotic heart disease of native coronary artery with unspecified angina pectoris: Secondary | ICD-10-CM

## 2023-03-19 DIAGNOSIS — F4310 Post-traumatic stress disorder, unspecified: Secondary | ICD-10-CM

## 2023-03-19 DIAGNOSIS — I10 Essential (primary) hypertension: Secondary | ICD-10-CM

## 2023-03-19 DIAGNOSIS — D692 Other nonthrombocytopenic purpura: Secondary | ICD-10-CM | POA: Diagnosis not present

## 2023-03-19 DIAGNOSIS — N401 Enlarged prostate with lower urinary tract symptoms: Secondary | ICD-10-CM

## 2023-03-19 DIAGNOSIS — E782 Mixed hyperlipidemia: Secondary | ICD-10-CM

## 2023-03-19 DIAGNOSIS — J4540 Moderate persistent asthma, uncomplicated: Secondary | ICD-10-CM

## 2023-03-19 DIAGNOSIS — G4700 Insomnia, unspecified: Secondary | ICD-10-CM

## 2023-03-19 MED ORDER — FINASTERIDE 5 MG PO TABS: 5.0000 mg | ORAL_TABLET | Freq: Every day | ORAL | 2 refills | Status: DC

## 2023-03-19 MED ORDER — QUETIAPINE FUMARATE 25 MG PO TABS: 25.0000 mg | ORAL_TABLET | Freq: Every day | ORAL | 2 refills | Status: DC

## 2023-03-19 MED ORDER — ATORVASTATIN CALCIUM 20 MG PO TABS: 20.0000 mg | ORAL_TABLET | Freq: Every day | ORAL | 2 refills | Status: DC

## 2023-03-19 MED ORDER — METOPROLOL SUCCINATE ER 25 MG PO TB24: 25.0000 mg | ORAL_TABLET | Freq: Every day | ORAL | 2 refills | Status: AC

## 2023-03-19 MED ORDER — FLUOXETINE HCL 20 MG PO CAPS: 20.0000 mg | ORAL_CAPSULE | Freq: Every day | ORAL | 2 refills | Status: DC

## 2023-03-19 NOTE — Patient Instructions (Addendum)
Tdap and shingrix vaccines now and repeat 2nd shingrix in Nov  Come here in October for high dose flu around the 10 th   [name] only needs Shingrix and flu shot here

## 2023-05-01 ENCOUNTER — Ambulatory Visit
Payer: Federal, State, Local not specified - PPO | Admitting: Student in an Organized Health Care Education/Training Program

## 2023-08-22 ENCOUNTER — Other Ambulatory Visit: Payer: Self-pay | Admitting: Family Medicine

## 2023-08-22 DIAGNOSIS — J449 Chronic obstructive pulmonary disease, unspecified: Secondary | ICD-10-CM

## 2023-08-22 NOTE — Telephone Encounter (Signed)
Requested medication (s) are due for refill today: yes  Requested medication (s) are on the active medication list: yes  Last refill:  01/24/23  Future visit scheduled: yes  Notes to clinic:  Unable to refill per protocol, last refill by another provider.      Requested Prescriptions  Pending Prescriptions Disp Refills   budesonide-formoterol (SYMBICORT) 160-4.5 MCG/ACT inhaler [Pharmacy Med Name: BUDESONIDE-FORMOTEROL 160-4.5] 30.6 each 1    Sig: INHALE 2 PUFFS INTO THE LUNGS TWICE A DAY     Pulmonology:  Combination Products Passed - 08/22/2023 12:04 PM      Passed - Valid encounter within last 12 months    Recent Outpatient Visits           5 months ago Senile purpura [facility])   [place] [facility] Alba Cory, MD   11 months ago Coronary artery disease involving native coronary artery of native heart with angina pectoris [facility])   [place] [facility] Alba Cory, MD   1 year ago Stable angina pectoris [facility])   [place] [facility] Alba Cory, MD   1 year ago Stable angina pectoris [facility])    [facility] Alba Cory, MD   2 years ago Coronary artery disease involving native coronary artery of native heart with angina pectoris [facility])   [facility] Alba Cory, MD       Future Appointments             In 3 weeks Carlynn Purl, Danna Hefty, MD [facility]

## 2023-09-13 ENCOUNTER — Ambulatory Visit: Payer: Federal, State, Local not specified - PPO | Admitting: Family Medicine

## 2023-09-13 ENCOUNTER — Encounter: Payer: Self-pay | Admitting: Family Medicine

## 2023-09-13 DIAGNOSIS — J4489 Other specified chronic obstructive pulmonary disease: Secondary | ICD-10-CM

## 2023-09-13 DIAGNOSIS — N138 Other obstructive and reflux uropathy: Secondary | ICD-10-CM

## 2023-09-13 DIAGNOSIS — D692 Other nonthrombocytopenic purpura: Secondary | ICD-10-CM

## 2023-09-13 DIAGNOSIS — J4540 Moderate persistent asthma, uncomplicated: Secondary | ICD-10-CM

## 2023-09-13 DIAGNOSIS — I10 Essential (primary) hypertension: Secondary | ICD-10-CM

## 2023-09-13 DIAGNOSIS — I25119 Atherosclerotic heart disease of native coronary artery with unspecified angina pectoris: Secondary | ICD-10-CM

## 2023-09-13 DIAGNOSIS — R972 Elevated prostate specific antigen [PSA]: Secondary | ICD-10-CM

## 2023-09-13 DIAGNOSIS — N401 Enlarged prostate with lower urinary tract symptoms: Secondary | ICD-10-CM

## 2023-09-13 DIAGNOSIS — Z6836 Body mass index (BMI) 36.0-36.9, adult: Secondary | ICD-10-CM | POA: Diagnosis not present

## 2023-09-13 MED ORDER — OLMESARTAN MEDOXOMIL 40 MG PO TABS: 40.0000 mg | ORAL_TABLET | Freq: Every day | ORAL | 1 refills | Status: AC

## 2023-09-13 MED ORDER — BENZONATATE 100 MG PO CAPS: 100.0000 mg | ORAL_CAPSULE | Freq: Two times a day (BID) | ORAL | 0 refills | Status: DC | PRN

## 2023-09-13 NOTE — Patient Instructions (Addendum)
 You did not bring the Metoprolol  bottle, please make sure you have it at home  Please resume aspirin  81 mg daily

## 2023-09-13 NOTE — Progress Notes (Signed)
 Name: Willie Crane   MRN: [id number]    DOB: [birth year]   Date:09/13/2023       Progress Note  Subjective  Chief Complaint  Chief Complaint  Patient presents with   Medical Management of Chronic Issues   HPI   Angina Pectoris/Atherosclerosis of aorta : he states possible MI after surgery at the [place], he was seen by Dr. Hester back in 2019, I cannot see the studies done at the time. He has been on medical management . He is on statin, ARB, beta blocker (he states wife gives his medications -  not on medication bag today - he will check it at home)  and has been out of aspirin  81 mg.  He is doing well at this time CT chest showed atherosclerosis of aorta back in 2018 .He denies chest pain but has not been very active lately to know if decrease in exercise tolerance   COPD/ Asthma: he has been using Symbicort  . Under the care of pulmonologist He is using Ventolin  very seldom now at most 4 times per month. Symptoms have been controlled lately, no wheezing, cough or SOB. He would like a refill of tessalon  perles to take prn only cough    Morbid obesity: weight is stable since last Summer 2023 . BMI still above 35 with co-morbidities such as CAD/angina/COPD, discussed portion control for now , he has gained 7 lbs in the past year.   Senile purpura: on arms , he is on aspirin .  Reassurance given    BPH: Urologist has release him for their care We discussed not rechecking PSA due to his age but he asked me to check it last time and level was elevated, he asked to recheck now and see if he needs to see Urologist again.Taking Proscar  and has good urine flow    PTSD/Insomnia : he takes prozac  and states it works well for him  no longer having symptoms with medications, he used to have nightmares about [place] . He has been taking seroquel  , initially he felt like it helped but is taking a long time to fall asleep now. Sleep hygiene   Patient Active Problem List   Diagnosis Date Noted   Fatty liver  03/13/2022   COPD with asthma (HCC) 06/21/2020   Senile purpura (HCC) 06/21/2020   Insomnia 01/20/2020   PTSD (post-traumatic stress disorder) 02/10/2019   Coronary artery disease 06/30/2018   Essential hypertension 05/19/2018   Stable angina pectoris (HCC) 05/19/2018   Stage 2 moderate COPD by GOLD classification (HCC) 02/12/2017   History of acute myocardial infarction 09/27/2016   Prolonged Q-T interval on ECG 09/27/2016   Obesity (BMI 30.0-34.9) 12/27/2015   Moderate persistent asthma 09/28/2015   BPH with obstruction/lower urinary tract symptoms 07/12/2015   Hydrocele, bilateral 07/12/2015   Hyperlipidemia     Past Surgical History:  Procedure Laterality Date   BACK SURGERY     CARDIAC CATHETERIZATION  03/2009   [facility]: No significant coronary artery disease   [facility]ATION  [facility]/2014   [facility]   CARPAL TUNNEL RELEASE Right    CHOLECYSTECTOMY     COLONOSCOPY     COLONOSCOPY WITH PROPOFOL  N/A 10/31/2015   Procedure: COLONOSCOPY WITH PROPOFOL ;  Surgeon: Rogelia Copping, MD;  [facility];  Service: Endoscopy;  Laterality: N/A;   CYST EXCISION     CYSTOSCOPY W/ URETERAL STENT PLACEMENT Left 10/26/2016   Procedure: CYSTOSCOPY WITH STENT REPLACEMENT;  Surgeon: Redell Lynwood Napoleon, MD;  Location: [facility];  Service: Urology;  Laterality: Left;   CYSTOSCOPY WITH STENT PLACEMENT Left 10/05/2016   Procedure: CYSTOSCOPY WITH STENT PLACEMENT;  Surgeon: Redell Lynwood Napoleon, MD;  Location: [facility];  Service: Urology;  Laterality: Left;   HERNIA REPAIR     INGUINAL HERNIA REPAIR Right    INGUINAL HERNIA REPAIR Left 01/26/2016   Procedure: HERNIA REPAIR INGUINAL ADULT;  Surgeon: Louanne KANDICE Muse, MD;  Location: [facility];  Service: General;  Laterality: Left;   KNEE ARTHROSCOPY     LUMBAR LAMINECTOMY     POLYPECTOMY  10/31/2015   Procedure: POLYPECTOMY;  Surgeon: Rogelia Copping, MD;  Location: [facility];  Service: Endoscopy;;   URETEROSCOPY Left 10/05/2016    Procedure: URETEROSCOPY;  Surgeon: Redell Lynwood Napoleon, MD;  Location: [facility];  Service: Urology;  Laterality: Left;   URETEROSCOPY WITH HOLMIUM LASER LITHOTRIPSY Left 10/26/2016   Procedure: URETEROSCOPY WITH HOLMIUM LASER LITHOTRIPSY;  Surgeon: Redell Lynwood Napoleon, MD;  Location: [facility];  Service: Urology;  Laterality: Left;   VASECTOMY      Family History  Problem Relation Age of Onset   Kidney failure Mother    Cancer Mother    Cancer Father        lung   Prostate cancer Neg Hx     Social History   Tobacco Use   Smoking status: Former    Current packs/day: 0.00    Average packs/day: 1 pack/day for 20.0 years (20.0 ttl pk-yrs)    Types: Cigarettes    Start date: 01/19/1959    Quit date: 01/19/1979    Years since quitting: 44.6   Smokeless tobacco: Former    Types: Chew  Substance Use Topics   Alcohol use: Yes    Alcohol/week: 0.0 standard drinks of alcohol    Comment: Occasional Wine     Current Outpatient Medications:    albuterol  (VENTOLIN  HFA) 108 (90 Base) MCG/ACT inhaler, Inhale 2 puffs into the lungs every 4 (four) hours as needed for wheezing or shortness of breath., Disp: 18 g, Rfl: 6   atorvastatin  (LIPITOR) 20 MG tablet, Take 1 tablet (20 mg total) by mouth daily., Disp: 90 tablet, Rfl: 2   budesonide -formoterol  (SYMBICORT ) 160-4.5 MCG/ACT inhaler, Inhale 2 puffs into the lungs 2 (two) times daily., Disp: 10.2 g, Rfl: 12   finasteride  (PROSCAR ) 5 MG tablet, Take 1 tablet (5 mg total) by mouth daily., Disp: 90 tablet, Rfl: 2   FLUoxetine  (PROZAC ) 20 MG capsule, Take 1 capsule (20 mg total) by mouth daily., Disp: 90 capsule, Rfl: 2   Multiple Vitamins-Minerals (MULTI ADULT GUMMIES) CHEW, Chew 1 tablet by mouth daily., Disp: , Rfl:    olmesartan  (BENICAR ) 40 MG tablet, Take 1 tablet (40 mg total) by mouth daily., Disp: 90 tablet, Rfl: 1   QUEtiapine  (SEROQUEL ) 25 MG tablet, Take 1 tablet (25 mg total) by mouth at bedtime. In place of seroquel  for sleep, Disp: 90  tablet, Rfl: 2   Spacer/Aero-Holding Chambers (AEROCHAMBER MV) inhaler, Use as instructed, Disp: 1 each, Rfl: 0   ASPIRIN  LOW DOSE 81 MG tablet, TAKE 1 TABLET BY MOUTH EVERY DAY (Patient not taking: Reported on 09/13/2023), Disp: 90 tablet, Rfl: 1   dicyclomine  (BENTYL ) 10 MG capsule, Take 1 capsule (10 mg total) by mouth 4 (four) times daily -  before meals and at bedtime. (Patient not taking: Reported on 09/13/2023), Disp: 30 capsule, Rfl: 0   loperamide  (IMODIUM ) 2 MG capsule, Take by mouth 4 (four) times daily as needed. (Patient not taking:  Reported on 09/13/2023), Disp: , Rfl:    metoprolol  succinate (TOPROL -XL) 25 MG 24 hr tablet, Take 1 tablet (25 mg total) by mouth daily. (Patient not taking: Reported on 09/13/2023), Disp: 90 tablet, Rfl: 2  Allergies  Allergen Reactions   Other Swelling    Lips swell - Potatoes, Kiwi, carrots, celery (raw)  general anesthesia - nausea     I personally reviewed active problem list, medication list, allergies with the patient/caregiver today.   ROS  Ten systems reviewed and is negative except as mentioned in HPI    Objective  Vitals:   09/13/23 1426  BP: 136/84  Pulse: 90  Resp: 16  SpO2: 97%  Weight: 230 lb 14.4 oz (104.7 kg)  Height: 5' 7 (1.702 m)    Body mass index is 36.16 kg/m.  Physical Exam  Constitutional: Patient appears well-developed and well-nourished. Obese  No distress.  HEENT: head atraumatic, normocephalic, pupils equal and reactive to light, , neck supple Cardiovascular: Normal rate, regular rhythm and normal heart sounds.  No murmur heard. No BLE edema. Pulmonary/Chest: Effort normal and breath sounds normal. No respiratory distress. Abdominal: Soft.  There is no tenderness. Psychiatric: Patient has a normal mood and affect. behavior is normal. Judgment and thought content normal.   Diabetic Foot Exam:     PHQ2/9:    09/13/2023    2:22 PM 03/19/2023    1:54 PM 09/14/2022    1:16 PM 03/13/2022    1:38 PM 09/11/2021     1:06 PM  Depression screen PHQ 2/9  Decreased Interest 0 0 0 0 0  Down, Depressed, Hopeless 0 0 0 0 0  PHQ - 2 Score 0 0 0 0 0  Altered sleeping 0 0 0 2 0  Tired, decreased energy 0 0 0 1 0  Change in appetite 0 0 0 0 0  Feeling bad or failure about yourself  0 0 0 0 0  Trouble concentrating 0 0 0 0 0  Moving slowly or fidgety/restless 0 0 0 0 0  Suicidal thoughts 0 0 0 0 0  PHQ-9 Score 0 0 0 3 0  Difficult doing work/chores Not difficult at all   Not difficult at all     phq 9 is negative  Fall Risk:    09/13/2023    2:22 PM 03/19/2023    1:54 PM 09/14/2022    1:11 PM 03/13/2022    1:24 PM 09/11/2021    1:06 PM  Fall Risk   Falls in the past year? 0 1 0 1 0  Number falls in past yr: 0 0  1 0  Injury with Fall? 0 0  0 0  Risk for fall due to : No Fall Risks History of fall(s) No Fall Risks No Fall Risks No Fall Risks  Follow up Falls prevention discussed;Education provided;Falls evaluation completed Falls prevention discussed;Education provided;Falls evaluation completed Falls prevention discussed;Education provided;Falls evaluation completed Falls prevention discussed Falls prevention discussed     Assessment & Plan  1. Morbid obesity (HCC) (Primary)  Discussed with the patient the risk posed by an increased BMI. Discussed importance of portion control, calorie counting and at least 150 minutes of physical activity weekly. Avoid sweet beverages and drink more water . Eat at least 6 servings of fruit and vegetables daily    2. Senile purpura (HCC)  Reassurance given   3. Coronary artery disease involving native coronary artery of native heart with angina pectoris (HCC)  Needs to take beta blocker  4. COPD with asthma (HCC)  - benzonatate  (TESSALON ) 100 MG capsule; Take 1 capsule (100 mg total) by mouth 2 (two) times daily as needed for cough.  Dispense: 40 capsule; Refill: 0  5. BPH with obstruction/lower urinary tract symptoms  - PSA  6. Moderate persistent asthma  without complication  Keep follow up with pulmonologist   7. Essential hypertension  - olmesartan  (BENICAR ) 40 MG tablet; Take 1 tablet (40 mg total) by mouth daily.  Dispense: 90 tablet; Refill: 1  8 PSA elevation  - PSA

## 2023-09-14 LAB — PSA: PSA: 5.57 ng/mL — ABNORMAL HIGH (ref <=4.00)

## 2023-09-16 ENCOUNTER — Other Ambulatory Visit: Payer: Self-pay | Admitting: Family Medicine

## 2023-09-16 DIAGNOSIS — R972 Elevated prostate specific antigen [PSA]: Secondary | ICD-10-CM

## 2023-10-07 NOTE — Progress Notes (Unsigned)
 10/08/2023 4:46 PM   Willie Crane [birth year] [id number]  Referring provider: Alba Cory, MD [address] [place],  [place] [address]  Urological history: 1. BPH with LU TS -PSA (09/2023) 5.57 -Finasteride 5 mg daily  2.  Bilateral nephrolithiasis -CT (08/2022) -punctate nonobstructing bilateral renal stones  3. Bilateral renal cysts -CT (08/2022) -bilateral renal cysts  Chief Complaint  Patient presents with   Elevated PSA   HPI: Willie Crane is a 78 y.o. male who presents today for elevated PSA.    Previous records reviewed.   I PSS 6/0  PVR 12 mL   UA yellow clear, specific every 1.025, 3+ heme, pH 5.5, 0-5 WBCs, 11-30 RBCs, 0-10 epithelial cells, mucus threads present and a few bacteria  He just has baseline weak stream.   Patient denies any modifying or aggravating factors.  Patient denies any recent UTI's, gross hematuria, dysuria or suprapubic/flank pain.  Patient denies any fevers, chills, nausea or vomiting.     IPSS     Row Name 10/08/23 1600         International Prostate Symptom Score   How often have you had the sensation of not emptying your bladder? Not at All     How often have you had to urinate less than every two hours? Less than 1 in 5 times     How often have you found you stopped and started again several times when you urinated? Less than 1 in 5 times     How often have you found it difficult to postpone urination? Less than 1 in 5 times     How often have you had a weak urinary stream? Less than half the time     How often have you had to strain to start urination? Not at All     How many times did you typically get up at night to urinate? 1 Time     Total IPSS Score 6       Quality of Life due to urinary symptoms   If you were to spend the rest of your life with your urinary condition just the way it is now how would you feel about that? Delighted              Score:  1-7 Mild 8-19 Moderate 20-35  Severe   PMH: Past Medical History:  Diagnosis Date   Anxiety    Asthma    Benign neoplasm of transverse colon    Benign paroxysmal positional vertigo    BPH (benign prostatic hypertrophy)    Complication of anesthesia    COPD (chronic obstructive pulmonary disease) (HCC)    Coronary artery disease    history of heart attack   Degeneration of lumbar or lumbosacral intervertebral disc    History of kidney stones    Hyperlipidemia    Hypertension    MI (myocardial infarction) (HCC)    1980s   Nephrolithiasis 09/28/2016   Obesity, unspecified    PONV (postoperative nausea and vomiting)    PTSD (post-traumatic stress disorder)    Renal disorder    kidney stones   Thoracic or lumbosacral neuritis or radiculitis, unspecified    Unspecified congenital anomaly of the integument    Unspecified gastritis and gastroduodenitis without mention of hemorrhage     Surgical History: Past Surgical History:  Procedure Laterality Date   BACK SURGERY     CARDIAC CATHETERIZATION  03/2009   [facility]: No significant coronary artery disease  CARDIAC CAT[facility]IZATION  01/2014   [facility]   CARPAL TUNNEL RELEASE Right    CHOLECYSTECTOMY     COLONOSCOPY     COLONOSCOPY WITH PROPOFOL N/A 10/31/2015   Procedure: COLONOSCOPY WITH PROPOFOL;  Surgeon: Midge Minium, MD;  Location: [facility];  Service: Endoscopy;  Laterality: N/A;   CYST EXCISION     CYSTOSCOPY W/ URETERAL STENT PLACEMENT Left 10/26/2016   Procedure: CYSTOSCOPY WITH STENT REPLACEMENT;  Surgeon: Hildred Laser, MD;  Location: [facility];  Service: Urology;  Laterality: Left;   CYSTOSCOPY WITH STENT PLACEMENT Left 10/05/2016   Procedure: CYSTOSCOPY WITH STENT PLACEMENT;  Surgeon: Hildred Laser, MD;  Location: [facility];  Service: Urology;  Laterality: Left;   HERNIA REPAIR     INGUINAL HERNIA REPAIR Right    INGUINAL HERNIA REPAIR Left 01/26/2016   Procedure: HERNIA REPAIR INGUINAL ADULT;  Surgeon: Kieth Brightly, MD;  Location:  [facility];  Service: General;  Laterality: Left;   KNEE ARTHROSCOPY     LUMBAR LAMINECTOMY     POLYPECTOMY  10/31/2015   Procedure: POLYPECTOMY;  Surgeon: Midge Minium, MD;  Location: [facility];  Service: Endoscopy;;   URETEROSCOPY Left 10/05/2016   Procedure: URETEROSCOPY;  Surgeon: Hildred Laser, MD;  Location: [facility];  Service: Urology;  Laterality: Left;   URETEROSCOPY WITH HOLMIUM LASER LITHOTRIPSY Left 10/26/2016   Procedure: URETEROSCOPY WITH HOLMIUM LASER LITHOTRIPSY;  Surgeon: Hildred Laser, MD;  Location: [facility];  Service: Urology;  Laterality: Left;   VASECTOMY      Home Medications:  Allergies as of 10/08/2023       Reactions   Other Swelling   Lips swell - Potatoes, Kiwi, carrots, celery (raw) general anesthesia - nausea         Medication List        Accurate as of October 08, 2023  4:46 PM. If you have any questions, ask your nurse or doctor.          AeroChamber MV inhaler Use as instructed   albuterol 108 (90 Base) MCG/ACT inhaler Commonly known as: VENTOLIN HFA Inhale 2 puffs into the lungs every 4 (four) hours as needed for wheezing or shortness of breath.   Aspirin Low Dose 81 MG tablet Generic drug: aspirin EC TAKE 1 TABLET BY MOUTH EVERY DAY   atorvastatin 20 MG tablet Commonly known as: LIPITOR Take 1 tablet (20 mg total) by mouth daily.   benzonatate 100 MG capsule Commonly known as: TESSALON Take 1 capsule (100 mg total) by mouth 2 (two) times daily as needed for cough.   budesonide-formoterol 160-4.5 MCG/ACT inhaler Commonly known as: SYMBICORT Inhale 2 puffs into the lungs 2 (two) times daily.   finasteride 5 MG tablet Commonly known as: PROSCAR Take 1 tablet (5 mg total) by mouth daily.   FLUoxetine 20 MG capsule Commonly known as: PROZAC Take 1 capsule (20 mg total) by mouth daily.   metoprolol succinate 25 MG 24 hr tablet Commonly known as: TOPROL-XL Take 1 tablet (25 mg total) by mouth daily.   Multi Adult  Gummies Chew Chew 1 tablet by mouth daily.   olmesartan 40 MG tablet Commonly known as: BENICAR Take 1 tablet (40 mg total) by mouth daily.   QUEtiapine 25 MG tablet Commonly known as: SEROquel Take 1 tablet (25 mg total) by mouth at bedtime. In place of seroquel for sleep        Allergies:  Allergies  Allergen Reactions   Other Swelling  Lips swell - Potatoes, Kiwi, carrots, celery (raw)  general anesthesia - nausea     Family History: Family History  Problem Relation Age of Onset   Kidney failure Mother    Cancer Mother    Cancer Father        lung   Prostate cancer Neg Hx     Social History:  reports that he quit smoking about 44 years ago. His smoking use included cigarettes. He started smoking about 64 years ago. He has a 20 pack-year smoking history. He has quit using smokeless tobacco.  His smokeless tobacco use included chew. He reports current alcohol use. He reports that he does not use drugs.  ROS: Pertinent ROS in HPI  Physical Exam: BP (!) 156/85   Pulse 91   Ht 5\' 7"  (1.702 m)   Wt 230 lb (104.3 kg)   BMI 36.02 kg/m   Constitutional:  Well nourished. Alert and oriented, No acute distress. HEENT: [place] AT, moist mucus membranes.  Trachea midline Cardiovascular: No clubbing, cyanosis, or edema. Respiratory: Normal respiratory effort, no increased work of breathing. GU: No CVA tenderness.  No bladder fullness or masses.  Patient with circumcised phallus.  Urethral meatus is patent.  No penile discharge. No penile lesions or rashes. Scrotum without lesions, cysts, rashes and/or edema.  Testicles are located scrotally bilaterally. No masses are appreciated in the testicles. Left and right epididymis are normal. Rectal: Patient with  normal sphincter tone. Anus and perineum without scarring or rashes. No rectal masses are appreciated. Prostate is approximately 60 + grams, could not help eat the entire gland due to prostate size, no nodules are appreciated.  Seminal vesicles could not be palpated.  Skin: No rashes, bruises or suspicious lesions. Lymph: No cervical or inguinal adenopathy. Neurologic: Grossly intact, no focal deficits, moving all 4 extremities. Psychiatric: Normal mood and affect.  Laboratory Data: Lab Results  Component Value Date   WBC 8.6 03/19/2023   HGB 15.6 03/19/2023   HCT 47.2 03/19/2023   MCV 90.2 03/19/2023   PLT 228 03/19/2023    Lab Results  Component Value Date   CREATININE 0.97 03/19/2023    Lab Results  Component Value Date   PSA 5.57 (H) 09/13/2023   PSA 4.24 (H) 03/19/2023   PSA 2.66 10/19/2020        Component Value Date/Time   CHOL 130 03/19/2023 1438   CHOL 175 01/04/2016 1144   HDL 59 03/19/2023 1438   HDL 57 01/04/2016 1144   CHOLHDL 2.2 03/19/2023 1438   VLDL 30 06/24/2018 1502   LDLCALC 45 03/19/2023 1438    Lab Results  Component Value Date   AST 19 03/19/2023   Lab Results  Component Value Date   ALT 21 03/19/2023   Urinalysis See EPIC and HPI  I have reviewed the labs.   Pertinent Imaging:  10/08/23 15:58  Scan Result 12ml    Assessment & Plan:    1. Elevated PSA  -I explained that we obtained a free and total PSA today to further stratify the reason for this PSA elevation.  He does have a enlarged prostate and it may be because of benign growth versus cancerous process -If the free and total PSA indicate a higher probability that there is prostate cancer, we will pursue a prostate MRI  2. BPH with LU TS -DRE benign  -UA w/ micro heme  -PVR < 300 cc  -symptoms - weak stream  -continue conservative management, avoiding bladder irritants and timed  voiding's -Continue finasteride 5 mg daily  3.  Nephrolithiasis -Punctate nonobstructing renal stones on last year's CT -Asymptomatic  4. Renal cysts -Bilateral benign renal cysts on last year's CT  5. Microscopic hematuria -UA w/ micro heme -urine culture sent  -Explained that if the urine culture is  negative we will need to investigate further to see if the microscopic hematuria is result of his enlarged prostate, kidney stones or cancer    Return for pending free and total PSA results .  These notes generated with voice recognition software. I apologize for typographical errors.  Cloretta Ned  [facility] [address]  [address] [place], [place] [address] [phone]

## 2023-10-08 ENCOUNTER — Ambulatory Visit: Payer: Federal, State, Local not specified - PPO | Admitting: Urology

## 2023-10-08 ENCOUNTER — Encounter: Payer: Self-pay | Admitting: Urology

## 2023-10-08 VITALS — BP 156/85 | HR 91 | Ht 67.0 in | Wt 230.0 lb

## 2023-10-08 DIAGNOSIS — N138 Other obstructive and reflux uropathy: Secondary | ICD-10-CM

## 2023-10-08 DIAGNOSIS — R3129 Other microscopic hematuria: Secondary | ICD-10-CM

## 2023-10-08 DIAGNOSIS — N401 Enlarged prostate with lower urinary tract symptoms: Secondary | ICD-10-CM | POA: Diagnosis not present

## 2023-10-08 DIAGNOSIS — N281 Cyst of kidney, acquired: Secondary | ICD-10-CM | POA: Diagnosis not present

## 2023-10-08 DIAGNOSIS — N200 Calculus of kidney: Secondary | ICD-10-CM | POA: Diagnosis not present

## 2023-10-08 DIAGNOSIS — R972 Elevated prostate specific antigen [PSA]: Secondary | ICD-10-CM | POA: Diagnosis not present

## 2023-10-08 LAB — URINALYSIS, COMPLETE
Bilirubin, UA: NEGATIVE
Glucose, UA: NEGATIVE
Ketones, UA: NEGATIVE
Leukocytes,UA: NEGATIVE
Nitrite, UA: NEGATIVE
Protein,UA: NEGATIVE
Specific Gravity, UA: 1.025 (ref 1.005–1.030)
Urobilinogen, Ur: 0.2 mg/dL (ref 0.2–1.0)
pH, UA: 5.5 (ref 5.0–7.5)

## 2023-10-08 LAB — MICROSCOPIC EXAMINATION

## 2023-10-08 LAB — BLADDER SCAN AMB NON-IMAGING

## 2023-10-09 LAB — PSA, TOTAL AND FREE
PSA, Free Pct: 5.8 %
PSA, Free: 0.42 ng/mL
Prostate Specific Ag, Serum: 7.2 ng/mL — ABNORMAL HIGH (ref 0.0–4.0)

## 2023-10-11 LAB — CULTURE, URINE COMPREHENSIVE

## 2023-10-13 LAB — PSA

## 2023-10-14 ENCOUNTER — Other Ambulatory Visit: Payer: Self-pay | Admitting: Urology

## 2023-10-14 DIAGNOSIS — R972 Elevated prostate specific antigen [PSA]: Secondary | ICD-10-CM

## 2023-10-14 DIAGNOSIS — N138 Other obstructive and reflux uropathy: Secondary | ICD-10-CM

## 2023-10-30 ENCOUNTER — Ambulatory Visit
Admission: RE | Admit: 2023-10-30 | Discharge: 2023-10-30 | Disposition: A | Discharge status: Home / Self Care | Source: Ambulatory Visit | Attending: Urology | Admitting: Urology

## 2023-10-30 DIAGNOSIS — R972 Elevated prostate specific antigen [PSA]: Secondary | ICD-10-CM | POA: Diagnosis present

## 2023-10-30 DIAGNOSIS — N138 Other obstructive and reflux uropathy: Secondary | ICD-10-CM | POA: Diagnosis present

## 2023-10-30 DIAGNOSIS — N401 Enlarged prostate with lower urinary tract symptoms: Secondary | ICD-10-CM | POA: Diagnosis present

## 2023-10-30 MED ORDER — GADOBUTROL 1 MMOL/ML IV SOLN
10.0000 mL | Freq: Once | INTRAVENOUS | Status: AC | PRN
  Administered 2023-10-30: 10 mL via INTRAVENOUS

## 2023-11-06 NOTE — Progress Notes (Unsigned)
 11/07/2023 3:12 PM   Willie Crane 1946-01-02 [id number]  Referring provider: Alba Cory, MD [address] [place],  [place] [address]  Urological history: 1. BPH with LU TS -PSA (09/2023) 5.57 -Finasteride 5 mg daily  2.  Bilateral nephrolithiasis -CT (08/2022) -punctate nonobstructing bilateral renal stones  3. Bilateral renal cysts -CT (08/2022) -bilateral renal cysts  Chief Complaint  Patient presents with   Results   HPI: Willie Crane is a 78 y.o. male who presents today for discussion of prostate MRI results.   Previous records reviewed.   At his visit on 10/08/2023, I PSS 6/0.  PVR 12 mL.  UA yellow clear, specific every 1.025, 3+ heme, pH 5.5, 0-5 WBCs, 11-30 RBCs, 0-10 epithelial cells, mucus threads present and a few bacteria.  He just has baseline weak stream.   Patient denies any modifying or aggravating factors.  Patient denies any recent UTI's, gross hematuria, dysuria or suprapubic/flank pain.  Patient denies any fevers, chills, nausea or vomiting.  He was to continue the finasteride and we repeated the PSA.    The free and total PSA returned at 7.2, free PSA of 0.42 and a percent free PSA of 5.8.  Prostate MRI (10/2023) PI-RADS 5 lesion identified at the bilateral anterior fibromuscular stroma in the mid gland and apex and bilateral anterior transition zone at the apex.  Prostate volume of 30.2.  PSAD 0.238.    PMH: Past Medical History:  Diagnosis Date   Anxiety    Asthma    Benign neoplasm of transverse colon    Benign paroxysmal positional vertigo    BPH (benign prostatic hypertrophy)    Complication of anesthesia    COPD (chronic obstructive pulmonary disease) (HCC)    Coronary artery disease    history of heart attack   Degeneration of lumbar or lumbosacral intervertebral disc    History of kidney stones    Hyperlipidemia    Hypertension    MI (myocardial infarction) (HCC)    1980s   Nephrolithiasis 09/28/2016    Obesity, unspecified    PONV (postoperative nausea and vomiting)    PTSD (post-traumatic stress disorder)    Renal disorder    kidney stones   Thoracic or lumbosacral neuritis or radiculitis, unspecified    Unspecified congenital anomaly of the integument    Unspecified gastritis and gastroduodenitis without mention of hemorrhage     Surgical History: Past Surgical History:  Procedure Laterality Date   BACK SURGERY     CARDIAC CATHETERIZATION  03/2009   [facility]: No significant coronary artery disease   CARDIAC CAT[facility]TERIZATION  01/2014   [facility]   CARPAL TUNNEL RELEASE Right    CHOLECYSTECTOMY     COLONOSCOPY     COLONOSCOPY WITH PROPOFOL N/A 10/31/2015   Procedure: COLONOSCOPY WITH PROPOFOL;  Surgeon: Midge Minium, MD;  Location: [facility];  Service: Endoscopy;  Laterality: N/A;   CYST EXCISION     CYSTOSCOPY W/ URETERAL STENT PLACEMENT Left 10/26/2016   Procedure: CYSTOSCOPY WITH STENT REPLACEMENT;  Surgeon: Hildred Laser, MD;  Location: [facility];  Service: Urology;  Laterality: Left;   CYSTOSCOPY WITH STENT PLACEMENT Left 10/05/2016   Procedure: CYSTOSCOPY WITH STENT PLACEMENT;  Surgeon: Hildred Laser, MD;  Location: [facility];  Service: Urology;  Laterality: Left;   HERNIA REPAIR     INGUINAL HERNIA REPAIR Right    INGUINAL HERNIA REPAIR Left 01/26/2016   Procedure: HERNIA REPAIR INGUINAL ADULT;  Surgeon: Kieth Brightly, MD;  Location: [facility];  Service: General;  Laterality: Left;   KNEE ARTHROSCOPY     LUMBAR LAMINECTOMY     POLYPECTOMY  10/31/2015   Procedure: POLYPECTOMY;  Surgeon: Midge Minium, MD;  Location: [facility];  Service: Endoscopy;;   URETEROSCOPY Left 10/05/2016   Procedure: URETEROSCOPY;  Surgeon: Hildred Laser, MD;  Location: [facility];  Service: Urology;  Laterality: Left;   URETEROSCOPY WITH HOLMIUM LASER LITHOTRIPSY Left 10/26/2016   Procedure: URETEROSCOPY WITH HOLMIUM LASER LITHOTRIPSY;  Surgeon: Hildred Laser, MD;  Location:  [facility];  Service: Urology;  Laterality: Left;   VASECTOMY      Home Medications:  Allergies as of 11/07/2023       Reactions   Other Swelling   Lips swell - Potatoes, Kiwi, carrots, celery (raw) general anesthesia - nausea         Medication List        Accurate as of November 07, 2023  3:12 PM. If you have any questions, ask your nurse or doctor.          STOP taking these medications    benzonatate 100 MG capsule Commonly known as: TESSALON Stopped by: Mateja Dier       TAKE these medications    AeroChamber MV inhaler Use as instructed   albuterol 108 (90 Base) MCG/ACT inhaler Commonly known as: VENTOLIN HFA Inhale 2 puffs into the lungs every 4 (four) hours as needed for wheezing or shortness of breath.   Aspirin Low Dose 81 MG tablet Generic drug: aspirin EC TAKE 1 TABLET BY MOUTH EVERY DAY   atorvastatin 20 MG tablet Commonly known as: LIPITOR Take 1 tablet (20 mg total) by mouth daily.   budesonide-formoterol 160-4.5 MCG/ACT inhaler Commonly known as: SYMBICORT Inhale 2 puffs into the lungs 2 (two) times daily.   finasteride 5 MG tablet Commonly known as: PROSCAR Take 1 tablet (5 mg total) by mouth daily.   FLUoxetine 20 MG capsule Commonly known as: PROZAC Take 1 capsule (20 mg total) by mouth daily.   metoprolol succinate 25 MG 24 hr tablet Commonly known as: TOPROL-XL Take 1 tablet (25 mg total) by mouth daily.   Multi Adult Gummies Chew Chew 1 tablet by mouth daily.   olmesartan 40 MG tablet Commonly known as: BENICAR Take 1 tablet (40 mg total) by mouth daily.   QUEtiapine 25 MG tablet Commonly known as: SEROquel Take 1 tablet (25 mg total) by mouth at bedtime. In place of seroquel for sleep        Allergies:  Allergies  Allergen Reactions   Other Swelling    Lips swell - Potatoes, Kiwi, carrots, celery (raw)  general anesthesia - nausea     Family History: Family History  Problem Relation Age of Onset   Kidney  failure Mother    Cancer Mother    Cancer Father        lung   Prostate cancer Neg Hx     Social History:  reports that he quit smoking about 44 years ago. His smoking use included cigarettes. He started smoking about 64 years ago. He has a 20 pack-year smoking history. He has quit using smokeless tobacco.  His smokeless tobacco use included chew. He reports current alcohol use. He reports that he does not use drugs.  ROS: Pertinent ROS in HPI  Physical Exam: BP (!) 163/91   Pulse 81   Ht 5\' 7"  (1.702 m)   Wt 230 lb (104.3 kg)  BMI 36.02 kg/m   Constitutional:  Well nourished. Alert and oriented, No acute distress. HEENT: [place] AT, moist mucus membranes.  Trachea midline, no masses. Cardiovascular: No clubbing, cyanosis, or edema. Respiratory: Normal respiratory effort, no increased work of breathing. Neurologic: Grossly intact, no focal deficits, moving all 4 extremities. Psychiatric: Normal mood and affect.   Laboratory Data: N/A  Pertinent Imaging: CLINICAL DATA:  Elevated PSA level.  R97.20   EXAM: MR PROSTATE WITHOUT AND WITH CONTRAST   TECHNIQUE: Multiplanar multisequence MRI images were obtained of the pelvis centered about the prostate. Pre and post contrast images were obtained. Advanced three-dimensional prostate surface reconstruction and three-dimensional lesion characterization was manually performed by the radiology physician on an independent workstation, including slice by slice characterization of all lesions and the prostate capsule from the high-resolution data set, with resulting targeting data sent to the UroNAV targeting system.   CONTRAST:  10mL GADAVIST GADOBUTROL 1 MMOL/ML IV SOLN   COMPARISON:  CT pelvis 08/27/2022   FINDINGS: Prostate: Encapsulated nodularity in the transition zone compatible with benign prostatic hypertrophy.   Region of interest # 1: PI-RADS category 5 lesion of the bilateral anterior fibromuscular stroma in mid gland  and apex, and bilateral anterior transition zone at the apex, with homogeneously reduced T2 signal (image 50 series 12) corresponding to low ADC map activity and substantially restricted diffusion (image 15 of series 10 and 11). This measures 0.73 cc (1.6 by 0.6 by 0.9 cm).   Volume: 3D volumetric assessment: Prostate volume 30.2 cc (4.1 by 3.7 by 4.3 cm).   Transcapsular spread: Absent   Seminal vesicle involvement: Absent   Neurovascular bundle involvement: Absent   Pelvic adenopathy: Absent   Bone metastasis: Absent   Other findings: Postoperative findings along the left groin, query prior hernia repair. Partially included right renal cyst, shown to be benign on 08/27/2022. No further imaging workup of this cyst is indicated.   IMPRESSION: 1. 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 Targeting data sent to [facility]. 2. Mild benign prostatic hypertrophy.     Electronically Signed   By: Gaylyn Rong M.D.   On: 10/31/2023 14:45  I have independently reviewed the films.  See HPI.    Assessment & Plan:    1. Elevated PSA  -Explained that the findings on the prostate MRI and in line with his PSA density, I would recommend to go forward with a biopsy of the prostate for further evaluation for prostate cancer Patient will be schedule for a TRUSPBx of prostate.  The procedure is explained and the risks involved, such as blood in urine, blood in stool, blood in semen, infection, urinary retention, and on rare occasions sepsis and death.  Patient understands the risks as explained to him and he wishes to proceed.    2. BPH with LU TS -continue conservative management, avoiding bladder irritants and timed voiding's -Continue finasteride 5 mg daily  3.  Nephrolithiasis -Punctate nonobstructing renal stones on last year's CT -Asymptomatic  4. Renal cysts -Bilateral  benign renal cysts on last year's CT  5. Microscopic hematuria -UA w/ micro heme - we may need to conduct a hematuria work up in the future, but we will reassess after the biopsy    Return for return for fusion biopsy of the prostate .  These notes generated with voice recognition software. I apologize for typographical errors.  Symone Cornman, PA-C  [facility] [address]  [address] [place], [place] [address] [phone]

## 2023-11-07 ENCOUNTER — Encounter: Payer: Self-pay | Admitting: Urology

## 2023-11-07 ENCOUNTER — Ambulatory Visit: Admitting: Urology

## 2023-11-07 VITALS — BP 163/91 | HR 81 | Ht 67.0 in | Wt 230.0 lb

## 2023-11-07 DIAGNOSIS — N401 Enlarged prostate with lower urinary tract symptoms: Secondary | ICD-10-CM

## 2023-11-07 DIAGNOSIS — R9389 Abnormal findings on diagnostic imaging of other specified body structures: Secondary | ICD-10-CM

## 2023-11-07 DIAGNOSIS — R3129 Other microscopic hematuria: Secondary | ICD-10-CM

## 2023-11-07 DIAGNOSIS — R972 Elevated prostate specific antigen [PSA]: Secondary | ICD-10-CM | POA: Diagnosis not present

## 2023-11-07 DIAGNOSIS — N281 Cyst of kidney, acquired: Secondary | ICD-10-CM

## 2023-11-07 DIAGNOSIS — N138 Other obstructive and reflux uropathy: Secondary | ICD-10-CM

## 2023-11-07 NOTE — Patient Instructions (Signed)
 Transrectal Prostate Biopsy/Fusion Biopsy Patient Education and Post Procedure Instructions    -Definition A prostate biopsy is the removal of a small amount of tissue from the prostate gland. The tissue is examined to determine whether there is cancer.  -Reasons for Procedure A prostate biopsy is usually done after an abnormal finding by: Digital rectal exam Prostate specific antigen (PSA) blood test A prostate biopsy is the only way to find out if cancer cells are present.  -Possible Complications Problems from the procedure are rare, but all procedures have some risk including: Infection Bruising or lengthy bleeding from the rectum, or in urine or semen Difficulty urinating Reactions to anesthesia Factors that may increase the risk of complications include: Smoking History of bleeding disorders or easy bruising Use of any medications, over-the-counter medications, or herbal supplements Sensitivity or allergy to latex, medications, or anesthesia.  -Prior to Procedure Talk to your doctor about your medications. Blood thinning medications including aspirin should be stopped 1 week prior to procedure. If prescribed by your cardiologist we may need approval before stopping medications. Use a Fleets enema 2 hours before the procedure. Can be purchased at your pharmacy. Antibiotics will be administered in the clinic prior to procedure.  Please make sure you eat a light meal prior to coming in for your appointment. This can help prevent lightheadedness during the procedure and upset stomach from antibiotics. Please bring someone with you to the procedure to drive you home.  -Anesthesia Transrectal biopsy: Local anesthesia--Just the area that is being operated on is numbed using an injectable anesthetic.  -Description of the Procedure Transrectal biopsy--Your doctor will insert a small ultrasound device into the rectum. This device will produce sound waves to create an image of the  prostate. These images will help guide placement of the needle. Your doctor will then insert the needle through the wall of the rectum and into the prostate gland. The procedure should take approximately 15-30 minutes.  -Will It Hurt? You may have discomfort and soreness at the biopsy site. Pain and discomfort after the procedure can be managed with medications.  -Postoperative Care When you return home after the procedure, do the following to help ensure a smooth recovery: Stay hydrated. Drink plenty of fluids for the next few days. Avoid difficult physical activity the day and evening of the procedure. Keep in mind that you may see blood in your urine, stool, or semen for several days. Resume any medications that were stopped when you are advised to do so.  After the sample is taken, it will be sent to a pathologist for examination under a microscope. This doctor will analyze the sample for cancer. You will be scheduled for an appointment to discuss results. If cancer is present, your doctor will work with you to develop a treatment plan.   -Call Your Doctor or Seek Immediate Medical Attention It is important to monitor your recovery. Alert your doctor to any problems. If any of the following occur, call your doctor or go to the emergency room: Fever 100.5 or greater within 1 week post procedure go directly to ER Call the office for: Blood in the urine more than 1 week or in semen for more than 6 weeks post-biopsy Pain that you cannot control with the medications you have been given Pain, burning, urgency, or frequency of urination Cough, shortness of breath, or chest pain- if severe go to ER Heavy rectal bleeding or bleeding that lasts more than 1 week after the biopsy If you have  any questions or concerns please contact our office at [facility]  [facility] [place] [address] [place], [place] [address]  [phone]

## 2023-11-26 ENCOUNTER — Ambulatory Visit: Admitting: Urology

## 2024-01-03 ENCOUNTER — Ambulatory Visit: Admitting: Urology

## 2024-01-03 VITALS — BP 182/87 | HR 83 | Ht 66.5 in | Wt 215.0 lb

## 2024-01-03 DIAGNOSIS — Z2989 Encounter for other specified prophylactic measures: Secondary | ICD-10-CM | POA: Diagnosis not present

## 2024-01-03 DIAGNOSIS — R972 Elevated prostate specific antigen [PSA]: Secondary | ICD-10-CM

## 2024-01-03 DIAGNOSIS — C61 Malignant neoplasm of prostate: Secondary | ICD-10-CM | POA: Diagnosis not present

## 2024-01-03 DIAGNOSIS — N429 Disorder of prostate, unspecified: Secondary | ICD-10-CM | POA: Diagnosis not present

## 2024-01-03 DIAGNOSIS — Z792 Long term (current) use of antibiotics: Secondary | ICD-10-CM

## 2024-01-03 MED ORDER — GENTAMICIN SULFATE 40 MG/ML IJ SOLN
80.0000 mg | Freq: Once | INTRAMUSCULAR | Status: AC
Start: 2024-01-03 — End: 2024-01-03
  Administered 2024-01-03: 80 mg via INTRAMUSCULAR

## 2024-01-03 MED ORDER — LEVOFLOXACIN 500 MG PO TABS
500.0000 mg | ORAL_TABLET | Freq: Once | ORAL | Status: AC
  Administered 2024-01-03: 500 mg via ORAL

## 2024-01-03 NOTE — Patient Instructions (Signed)

## 2024-01-03 NOTE — Procedures (Signed)
   01/03/24  Indication: 78 year old male with PSA of 7.2 and suspicious prostate MRI with a PI-RADS 5 lesion, prostate volume 32 g presents today for prostate biopsy.  MRI Fusion Prostate Biopsy Procedure   Informed consent was obtained, and we discussed the risks of bleeding and infection/sepsis. A time out was performed to ensure correct patient identity.  Pre-Procedure: - Gentamicin  and levaquin given for antibiotic prophylaxis - No significant hypoechoic or median lobe noted  Procedure: - Prostate block performed using 10 cc 1% lidocaine   - MRI fusion biopsy was performed, and 3 biopsies were taken from the ROI PIRADS5 lesion located bilateral anterior fibromuscular stroma in the mid and apex. - Standard biopsies taken from sextant areas, 12 under ultrasound guidance. - Total of 15 cores taken  Post-Procedure: - Patient tolerated the procedure well - He was counseled to seek immediate medical attention if experiences significant bleeding, fevers, or severe pain - Return in one week to discuss biopsy results  Assessment/ Plan: Will follow up in 1-2 weeks to discuss pathology with Dr. Cherylene Corrente or Anner Bars, MD

## 2024-01-21 ENCOUNTER — Encounter: Payer: Self-pay | Admitting: Urology

## 2024-01-21 ENCOUNTER — Ambulatory Visit: Admitting: Urology

## 2024-01-21 VITALS — BP 184/88 | HR 91 | Ht 66.0 in | Wt 235.2 lb

## 2024-01-21 DIAGNOSIS — C61 Malignant neoplasm of prostate: Secondary | ICD-10-CM | POA: Diagnosis not present

## 2024-01-21 NOTE — Patient Instructions (Signed)

## 2024-01-21 NOTE — Progress Notes (Signed)
   01/21/2024 3:53 PM   Willie Crane 04-23-1946 [id number]  Reason for visit: New diagnosis of prostate cancer  HPI: 78 year old male who has been worked up by Matilde Son, PA and Dustin Gimenez and was referred to me for new diagnosis of prostate cancer.  He has minimal urinary symptoms at baseline, has been on finasteride  long-term.  Prostate MRI showed a 30 g prostate with a PI-RADS 5 lesion, biopsy by Dr. Ace Holder showed high risk disease including Gleason score 4+5 = 9 prostate adenocarcinoma.  We had a lengthy conversation today about the patient's new diagnosis of prostate cancer.  We reviewed the risk classifications per the AUA guidelines including very low risk, low risk, intermediate risk, and high risk disease, and the need for additional staging imaging with CT and bone scan in patients with unfavorable intermediate risk and high risk disease.  I explained that his life expectancy, clinical stage, Gleason score, PSA, and other co-morbidities influence treatment strategies.  We discussed the roles of active surveillance, radiation therapy, surgical therapy with robotic prostatectomy, and hormone therapy with androgen deprivation.  We discussed that patients urinary symptoms also impact treatment strategy, as patients with severe lower urinary tract symptoms may have significant worsening or even develop urinary retention after undergoing radiation.  In regards to surgery, we discussed robotic prostatectomy +/- lymphadenectomy at length.  The procedure takes 3 to 4 hours, and patient's typically discharge home on post-op day #1.  A Foley catheter is left in place for 7 to 10 days to allow for healing of the vesicourethral anastomosis.  There is a small risk of bleeding, infection, damage to surrounding structures or bowel, hernia, DVT/PE, or serious cardiac or pulmonary complications.  We discussed at length post-op side effects including erectile dysfunction, and the importance of  pre-operative erectile function on long-term outcomes.  Even with a nerve sparing approach, there is an approximately 25% rate of permanent erectile dysfunction.  We also discussed postop urinary incontinence at length.  We expect patients to have stress incontinence post-operatively that will improve over period of weeks to months.  Less than 10% of men will require a pad at 1 year after surgery.  Patients will need to avoid heavy lifting and strenuous activity for 3 to 4 weeks, but most men return to their baseline activity status by 6 weeks.  I did not recommend surgery with his age of 63 and BMI of 63.  In summary, [name] is a 78 y.o. man with newly diagnosed high risk prostate cancer. He would like to pursue radiation.  PET scan ordered, referral placed to radiation oncology   Lawerence Pressman, MD  [facility] [address] [place], [place] [address] [phone]

## 2024-01-22 ENCOUNTER — Other Ambulatory Visit: Admitting: Urology

## 2024-01-26 ENCOUNTER — Other Ambulatory Visit: Payer: Self-pay | Admitting: Family Medicine

## 2024-01-26 DIAGNOSIS — N401 Enlarged prostate with lower urinary tract symptoms: Secondary | ICD-10-CM

## 2024-01-26 DIAGNOSIS — F4310 Post-traumatic stress disorder, unspecified: Secondary | ICD-10-CM

## 2024-01-26 DIAGNOSIS — E782 Mixed hyperlipidemia: Secondary | ICD-10-CM

## 2024-01-26 DIAGNOSIS — G4700 Insomnia, unspecified: Secondary | ICD-10-CM

## 2024-01-30 ENCOUNTER — Other Ambulatory Visit: Payer: Self-pay | Admitting: *Deleted

## 2024-01-30 ENCOUNTER — Encounter: Payer: Self-pay | Admitting: Radiation Oncology

## 2024-01-30 ENCOUNTER — Ambulatory Visit
Admission: RE | Admit: 2024-01-30 | Discharge: 2024-01-30 | Disposition: A | Discharge status: Home / Self Care | Source: Ambulatory Visit | Attending: Radiation Oncology | Admitting: Radiation Oncology

## 2024-01-30 VITALS — BP 188/110 | Resp 20 | Wt 219.0 lb

## 2024-01-30 DIAGNOSIS — C61 Malignant neoplasm of prostate: Secondary | ICD-10-CM

## 2024-01-30 NOTE — Consult Note (Signed)
 NEW PATIENT EVALUATION  Name: Willie Crane  MRN: [id number]  Date:   01/30/2024     DOB: 05-06-1946   This 78 y.o. 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  REFERRING PHYSICIAN: Sowles, Krichna, MD  CHIEF COMPLAINT:  Chief Complaint  Patient presents with   Prostate Cancer    DIAGNOSIS: There were no encounter diagnoses.   PREVIOUS INVESTIGATIONS:  MRI scan reviewed PSMA PET scan ordered Pathology reports reviewed Clinical notes reviewed  HPI: Patient is a 78 year old male who presents with an elevated PSA in the 7 range.  He had been on finasteride  5 mg daily.  He has a history of bilateral nephrolithiasis and bilateral renal cysts.  He was seen by urology underwent transrectal ultrasound-guided biopsy showing 4 of 13 cores positive for adenocarcinoma.  1 core was positive for Gleason 9 (4+5).  Other core biopsies were positive for Gleason 6 (3+3) as well as Gleason 7 (4+3).  His prostate volume is approximately 60 cc.  It was normal on digital rectal exam.  MRI scan was performed prior to biopsy showing a PI-RADS category 5 lesion of the bilateral anterior fibromuscular stroma in the mid gland and apex.  There is also bilateral transition zone involvement.  There is no evidence of bone metastasis pelvic adenopathy seminal vesicle involvement or trans capsular spread.  Patient has been scheduled for PSMA PET scan.  He has been consulted about robotic prostatectomy is now seen for radiation oncology opinion.  He is fairly asymptomatic specifically denies increased lower urinary tract symptoms any change in his bowel function or bone pain.  Patient was having some lower abdominal pain states he is not eating much and he was hypertensive today on examination  PLANNED TREATMENT REGIMEN: Image guided IMRT radiation therapy  PAST MEDICAL HISTORY:  has a past medical  history of Anxiety, Asthma, Benign neoplasm of transverse colon, Benign paroxysmal positional vertigo, BPH (benign prostatic hypertrophy), Complication of anesthesia, COPD (chronic obstructive pulmonary disease) (HCC), Coronary artery disease, Degeneration of lumbar or lumbosacral intervertebral disc, Elevated PSA, History of kidney stones, Hyperlipidemia, Hypertension, MI (myocardial infarction) (HCC), Nephrolithiasis (09/28/2016), Obesity, unspecified, PONV (postoperative nausea and vomiting), Prostate cancer (HCC), PTSD (post-traumatic stress disorder), Renal disorder, Thoracic or lumbosacral neuritis or radiculitis, unspecified, Unspecified congenital anomaly of the integument, and Unspecified gastritis and gastroduodenitis without mention of hemorrhage.    PAST SURGICAL HISTORY:  Past Surgical History:  Procedure Laterality Date   BACK SURGERY     CARDIAC CATHETERIZATION  03/2009   [facility]: No significant coronary artery disease   CARDIAC CATHETERIZATION  01/2014   [facility]   CARPAL TUNNEL RELEASE Right    CHOLECYSTECTOMY     COLONOSCOPY     COLONOSCOPY WITH PROPOFOL  N/A 10/31/2015   Procedure: COLONOSCOPY WITH PROPOFOL ;  Surgeon: Rogelia Copping, MD;  Location: [facility];  Service: Endoscopy;  Laterality: N/A;   CYST EXCISION     CYSTOSCOPY W/ URETERAL STENT PLACEMENT Left 10/26/2016   Procedure: CYSTOSCOPY WITH STENT REPLACEMENT;  Surgeon: Redell Lynwood Napoleon, MD;  Location: [facility];  Service: Urology;  Laterality: Left;   CYSTOSCOPY WITH STENT PLACEMENT Left 10/05/2016   Procedure: CYSTOSCOPY WITH STENT PLACEMENT;  Surgeon: Redell Lynwood Napoleon, MD;  Location: [facility];  Service: Urology;  Laterality: Left;   HERNIA REPAIR     INGUINAL HERNIA REPAIR Right    INGUINAL HERNIA REPAIR  Left 01/26/2016   Procedure: HERNIA REPAIR INGUINAL ADULT;  Surgeon: Louanne KANDICE Muse, MD;  Location: [facility];  Service: General;  Laterality: Left;   KNEE ARTHROSCOPY     LUMBAR LAMINECTOMY     POLYPECTOMY   10/31/2015   Procedure: POLYPECTOMY;  Surgeon: Rogelia Copping, MD;  Location: [facility];  Service: Endoscopy;;   URETEROSCOPY Left 10/05/2016   Procedure: URETEROSCOPY;  Surgeon: Redell Lynwood Napoleon, MD;  Location: [facility];  Service: Urology;  Laterality: Left;   URETEROSCOPY WITH HOLMIUM LASER LITHOTRIPSY Left 10/26/2016   Procedure: URETEROSCOPY WITH HOLMIUM LASER LITHOTRIPSY;  Surgeon: Redell Lynwood Napoleon, MD;  Location: [facility];  Service: Urology;  Laterality: Left;   VASECTOMY      FAMILY HISTORY: family history includes Cancer in his father and mother; Kidney failure in his mother.  SOCIAL HISTORY:  reports that he quit smoking about 45 years ago. His smoking use included cigarettes. He started smoking about 65 years ago. He has a 20 pack-year smoking history. He has quit using smokeless tobacco.  His smokeless tobacco use included chew. He reports current alcohol use. He reports that he does not use drugs.  ALLERGIES: Other  MEDICATIONS:  Current Outpatient Medications  Medication Sig Dispense Refill   albuterol  (VENTOLIN  HFA) 108 (90 Base) MCG/ACT inhaler Inhale 2 puffs into the lungs every 4 (four) hours as needed for wheezing or shortness of breath. 18 g 6   ASPIRIN  LOW DOSE 81 MG tablet TAKE 1 TABLET BY MOUTH EVERY DAY 90 tablet 1   atorvastatin  (LIPITOR) 20 MG tablet TAKE 1 TABLET BY MOUTH EVERY DAY 90 tablet 0   budesonide -formoterol  (SYMBICORT ) 160-4.5 MCG/ACT inhaler Inhale 2 puffs into the lungs 2 (two) times daily. 10.2 g 12   finasteride  (PROSCAR ) 5 MG tablet TAKE 1 TABLET (5 MG TOTAL) BY MOUTH DAILY. 90 tablet 0   FLUoxetine  (PROZAC ) 20 MG capsule TAKE 1 CAPSULE BY MOUTH EVERY DAY 90 capsule 0   metoprolol  succinate (TOPROL -XL) 25 MG 24 hr tablet Take 1 tablet (25 mg total) by mouth daily. 90 tablet 2   Multiple Vitamins-Minerals (MULTI ADULT GUMMIES) CHEW Chew 1 tablet by mouth daily.     olmesartan  (BENICAR ) 40 MG tablet Take 1 tablet (40 mg total) by mouth daily. 90  tablet 1   QUEtiapine  (SEROQUEL ) 25 MG tablet TAKE 1 TABLET (25 MG TOTAL) BY MOUTH AT BEDTIME. IN PLACE OF SEROQUEL  FOR SLEEP 90 tablet 0   Spacer/Aero-Holding Chambers (AEROCHAMBER MV) inhaler Use as instructed 1 each 0   No current facility-administered medications for this encounter.    ECOG PERFORMANCE STATUS:  0 - Asymptomatic  REVIEW OF SYSTEMS: Patient denies any weight loss, fatigue, weakness, fever, chills or night sweats. Patient denies any loss of vision, blurred vision. Patient denies any ringing  of the ears or hearing loss. No irregular heartbeat. Patient denies heart murmur or history of fainting. Patient denies any chest pain or pain radiating to her upper extremities. Patient denies any shortness of breath, difficulty breathing at night, cough or hemoptysis. Patient denies any swelling in the lower legs. Patient denies any nausea vomiting, vomiting of blood, or coffee ground material in the vomitus. Patient denies any stomach pain. Patient states has had normal bowel movements no significant constipation or diarrhea. Patient denies any dysuria, hematuria or significant nocturia. Patient denies any problems walking, swelling in the joints or loss of balance. Patient denies any skin changes, loss of hair or loss of weight. Patient denies any excessive  worrying or anxiety or significant depression. Patient denies any problems with insomnia. Patient denies excessive thirst, polyuria, polydipsia. Patient denies any swollen glands, patient denies easy bruising or easy bleeding. Patient denies any recent infections, allergies or URI. Patient s visual fields have not changed significantly in recent time.     PHYSICAL EXAM: Resp 20   Wt 219 lb (99.3 kg)   BMI 35.35 kg/m  Well-developed well-nourished patient in NAD. HEENT reveals PERLA, EOMI, discs not visualized.  Oral cavity is clear. No oral mucosal lesions are identified. Neck is clear without evidence of cervical or supraclavicular  adenopathy. Lungs are clear to A&P. Cardiac examination is essentially unremarkable with regular rate and rhythm without murmur rub or thrill. Abdomen is benign with no organomegaly or masses noted. Motor sensory and DTR levels are equal and symmetric in the upper and lower extremities. Cranial nerves II through XII are grossly intact. Proprioception is intact. No peripheral adenopathy or edema is identified. No motor or sensory levels are noted. Crude visual fields are within normal range.  LABORATORY DATA: Pathology reports reviewed    RADIOLOGY RESULTS: MRI scan reviewed PSMA PET scan pending   IMPRESSION: Probable stage IIIc Gleason 9 (4+5) adenocarcinoma the prostate in 78 year old male presenting with PSA in the 7 range  PLAN: At this time I have recommended image guided IMRT radiation therapy to his prostate.  Would like to review his PSMA PET scan prior to treatment planning.  Will see him back in approximately 2 weeks after his PSMA PET scan is performed in the meantime we are going to schedule him for marker placement with urology for image guided daily IMRT treatment.  Patient will also receive 35-month Eligard depot.  At this time based on the [facility] he has approximately 80% chance of extracapsular spread and a 20% chance of lymph node involvement.  PSMA PET scan should help discerning whether I need to treat his pelvic lymph nodes or not.  I would at his age favor excluding his pelvic lymph nodes and concentrating on his prostate treatment.     I also would like to start him on 6 months of Eligard.  Risks and benefits of treatment clued increased lower urinary tract symptoms possible diarrhea fatigue alteration blood counts skin reaction all were reviewed in detail with the patient.  He comprehends my treatment plan well.  I am starting the patient on Carafate half a tab twice a day as well as Prevacid for some of his stomach complaints.  Will also recommend he sees  GP4 is hypertension.  I would like to take this opportunity to thank you for allowing me to participate in the care of your patient.SABRA Marcey Penton, MD

## 2024-01-31 ENCOUNTER — Other Ambulatory Visit: Payer: Self-pay

## 2024-01-31 ENCOUNTER — Other Ambulatory Visit: Payer: Self-pay | Admitting: *Deleted

## 2024-01-31 ENCOUNTER — Telehealth: Payer: Self-pay

## 2024-01-31 MED ORDER — SUCRALFATE 1 G PO TABS: 1.0000 g | ORAL_TABLET | Freq: Three times a day (TID) | ORAL | 0 refills | Status: AC

## 2024-01-31 MED ORDER — LANSOPRAZOLE 30 MG PO CPDR: 30.0000 mg | DELAYED_RELEASE_CAPSULE | Freq: Every day | ORAL | 0 refills | Status: DC

## 2024-01-31 NOTE — Telephone Encounter (Signed)
 Morning patient left message stating he was seen yesterday and pain meds were supposed to be sent in to [facility] which they have not received. Dr. JAYSON consult yesterday. Notified radiation team he is requesting call patient back regarding pain meds

## 2024-01-31 NOTE — Progress Notes (Signed)
 [facility] [place] Phone: [phone] Fax: [phone] Cardiac Clearance  FMW:[id number] Patient Name:  [name] DOB: Apr 19, 1946 Procedure: Viviane Buttner Marker Placement Date of Procedure: 03/11/24 Cardiologist/PCP: Sowles Phone/Fax: [phone] Reason for Clearance:____ASA 81 mg  stop 7 days prior_____________  *Please complete note and fax back to our office. *  Clearance Note:  _____________________________________________________________________________________  Please sign:  Dr:__________________________________________________________________

## 2024-02-10 ENCOUNTER — Ambulatory Visit
Admission: RE | Admit: 2024-02-10 | Discharge: 2024-02-10 | Disposition: A | Discharge status: Home / Self Care | Source: Ambulatory Visit | Attending: Urology | Admitting: Urology

## 2024-02-10 DIAGNOSIS — C61 Malignant neoplasm of prostate: Secondary | ICD-10-CM | POA: Insufficient documentation

## 2024-02-10 MED ORDER — FLOTUFOLASTAT F 18 GALLIUM 296-5846 MBQ/ML IV SOLN
8.0000 | Freq: Once | INTRAVENOUS | Status: AC
  Administered 2024-02-10: 7.25 via INTRAVENOUS
  Filled 2024-02-10: qty 8

## 2024-02-18 ENCOUNTER — Telehealth: Payer: Self-pay

## 2024-02-18 NOTE — Telephone Encounter (Signed)
 Incoming call from pt on triage line requesting results on his PET scan from 02/10/24. Please advise.

## 2024-02-19 NOTE — Telephone Encounter (Signed)
 Called pt informed him of the information per Dr. Francisca. Pt voiced understanding. All questions answered.

## 2024-02-22 ENCOUNTER — Other Ambulatory Visit: Payer: Self-pay | Admitting: Radiation Oncology

## 2024-03-02 ENCOUNTER — Telehealth: Payer: Self-pay

## 2024-03-02 NOTE — Telephone Encounter (Signed)
 Auth Submission: NO AUTH NEEDED Site of care: [facility] Payer: [employer] FEP Medication & CPT/J Code(s) submitted: Eligard [id number] Diagnosis Code:  Route of submission (phone, fax, portal): phone Phone # [phone] Units/visits requested: every 6 months Reference number: [id number] Approval from: 03/02/24 to 08/05/24

## 2024-03-11 ENCOUNTER — Ambulatory Visit: Admitting: Urology

## 2024-03-11 VITALS — BP 184/109 | HR 87 | Wt 220.0 lb

## 2024-03-11 DIAGNOSIS — C61 Malignant neoplasm of prostate: Secondary | ICD-10-CM

## 2024-03-11 DIAGNOSIS — Z2989 Encounter for other specified prophylactic measures: Secondary | ICD-10-CM | POA: Diagnosis not present

## 2024-03-11 MED ORDER — GENTAMICIN SULFATE 40 MG/ML IJ SOLN
80.0000 mg | Freq: Once | INTRAMUSCULAR | Status: AC
Start: 2024-03-11 — End: 2024-03-11
  Administered 2024-03-11: 80 mg via INTRAMUSCULAR

## 2024-03-11 MED ORDER — LEUPROLIDE ACETATE (6 MONTH) 45 MG ~~LOC~~ KIT
45.0000 mg | PACK | Freq: Once | SUBCUTANEOUS | Status: AC
  Administered 2024-03-11: 45 mg via SUBCUTANEOUS

## 2024-03-11 MED ORDER — LEVOFLOXACIN 500 MG PO TABS
500.0000 mg | ORAL_TABLET | Freq: Once | ORAL | Status: AC
  Administered 2024-03-11: 500 mg via ORAL

## 2024-03-11 NOTE — Progress Notes (Signed)
   03/11/24  Indication: High risk prostate cancer  Prostate gold seed marker procedure   Informed consent was obtained, and we discussed the risks of bleeding and infection/sepsis. A time out was performed to ensure correct patient identity.  Pre-Procedure: - Gentamicin  and levaquin  given for antibiotic prophylaxis  Procedure: - Transrectal ultrasound inserted into the rectum and gold seed markers placed bilaterally at the base, and centrally at the apex  Post-Procedure: - Patient tolerated the procedure well - He was counseled to seek immediate medical attention if experiences significant bleeding, fevers, or severe pain   Assessment/ Plan: - 100-month ADT injection given today - Follow-up with radiation oncology for XRT - RTC with urology 6 months PSA prior  Willie Burnet, MD 03/11/2024

## 2024-03-11 NOTE — Patient Instructions (Signed)
 Take calcium , vitamin D, and vitamin E supplements to help with side effects from the hormone treatment and radiation.

## 2024-03-13 ENCOUNTER — Ambulatory Visit: Payer: Federal, State, Local not specified - PPO | Admitting: Family Medicine

## 2024-03-16 ENCOUNTER — Ambulatory Visit
Admission: RE | Admit: 2024-03-16 | Discharge: 2024-03-16 | Disposition: A | Discharge status: Home / Self Care | Source: Ambulatory Visit | Attending: Radiation Oncology | Admitting: Radiation Oncology

## 2024-03-16 DIAGNOSIS — Z510 Encounter for antineoplastic radiation therapy: Secondary | ICD-10-CM | POA: Diagnosis present

## 2024-03-16 DIAGNOSIS — C61 Malignant neoplasm of prostate: Secondary | ICD-10-CM | POA: Insufficient documentation

## 2024-03-18 ENCOUNTER — Ambulatory Visit: Admitting: Family Medicine

## 2024-03-20 ENCOUNTER — Other Ambulatory Visit: Payer: Self-pay | Admitting: *Deleted

## 2024-03-20 DIAGNOSIS — C61 Malignant neoplasm of prostate: Secondary | ICD-10-CM

## 2024-03-23 DIAGNOSIS — Z510 Encounter for antineoplastic radiation therapy: Secondary | ICD-10-CM | POA: Diagnosis not present

## 2024-03-24 ENCOUNTER — Ambulatory Visit
Admission: RE | Admit: 2024-03-24 | Discharge: 2024-03-24 | Disposition: A | Discharge status: Home / Self Care | Source: Ambulatory Visit | Attending: Radiation Oncology | Admitting: Radiation Oncology

## 2024-03-25 ENCOUNTER — Other Ambulatory Visit: Payer: Self-pay

## 2024-03-25 ENCOUNTER — Ambulatory Visit
Admission: RE | Admit: 2024-03-25 | Discharge: 2024-03-25 | Discharge status: Home / Self Care | Source: Ambulatory Visit | Attending: Radiation Oncology | Admitting: Radiation Oncology

## 2024-03-25 DIAGNOSIS — Z510 Encounter for antineoplastic radiation therapy: Secondary | ICD-10-CM | POA: Diagnosis not present

## 2024-03-25 LAB — RAD ONC ARIA SESSION SUMMARY
Course Elapsed Days: 0
Plan Fractions Treated to Date: 1
Plan Prescribed Dose Per Fraction: 2 Gy
Plan Total Fractions Prescribed: 40
Plan Total Prescribed Dose: 80 Gy
Reference Point Dosage Given to Date: 2 Gy
Reference Point Session Dosage Given: 2 Gy
Session Number: 1

## 2024-03-26 ENCOUNTER — Other Ambulatory Visit: Payer: Self-pay

## 2024-03-26 ENCOUNTER — Ambulatory Visit
Admission: RE | Admit: 2024-03-26 | Discharge: 2024-03-26 | Disposition: A | Discharge status: Home / Self Care | Source: Ambulatory Visit | Attending: Radiation Oncology | Admitting: Radiation Oncology

## 2024-03-26 DIAGNOSIS — Z510 Encounter for antineoplastic radiation therapy: Secondary | ICD-10-CM | POA: Diagnosis not present

## 2024-03-26 LAB — RAD ONC ARIA SESSION SUMMARY
Course Elapsed Days: 1
Plan Fractions Treated to Date: 2
Plan Prescribed Dose Per Fraction: 2 Gy
Plan Total Fractions Prescribed: 40
Plan Total Prescribed Dose: 80 Gy
Reference Point Dosage Given to Date: 4 Gy
Reference Point Session Dosage Given: 2 Gy
Session Number: 2

## 2024-03-27 ENCOUNTER — Ambulatory Visit

## 2024-03-30 ENCOUNTER — Other Ambulatory Visit: Payer: Self-pay

## 2024-03-30 ENCOUNTER — Ambulatory Visit
Admission: RE | Admit: 2024-03-30 | Discharge: 2024-03-30 | Disposition: A | Discharge status: Home / Self Care | Source: Ambulatory Visit | Attending: Radiation Oncology | Admitting: Radiation Oncology

## 2024-03-30 ENCOUNTER — Inpatient Hospital Stay: Attending: Radiation Oncology

## 2024-03-30 DIAGNOSIS — C61 Malignant neoplasm of prostate: Secondary | ICD-10-CM | POA: Insufficient documentation

## 2024-03-30 DIAGNOSIS — Z510 Encounter for antineoplastic radiation therapy: Secondary | ICD-10-CM | POA: Diagnosis not present

## 2024-03-30 LAB — RAD ONC ARIA SESSION SUMMARY
Course Elapsed Days: 5
Plan Fractions Treated to Date: 3
Plan Prescribed Dose Per Fraction: 2 Gy
Plan Total Fractions Prescribed: 40
Plan Total Prescribed Dose: 80 Gy
Reference Point Dosage Given to Date: 6 Gy
Reference Point Session Dosage Given: 2 Gy
Session Number: 3

## 2024-03-30 LAB — CBC (CANCER CENTER ONLY)
HCT: 47.7 % (ref 39.0–52.0)
Hemoglobin: 15.9 g/dL (ref 13.0–17.0)
MCH: 30.4 pg (ref 26.0–34.0)
MCHC: 33.3 g/dL (ref 30.0–36.0)
MCV: 91.2 fL (ref 80.0–100.0)
Platelet Count: 220 K/uL (ref 150–400)
RBC: 5.23 MIL/uL (ref 4.22–5.81)
RDW: 12.8 % (ref 11.5–15.5)
WBC Count: 7.5 K/uL (ref 4.0–10.5)
nRBC: 0 % (ref 0.0–0.2)

## 2024-03-31 ENCOUNTER — Ambulatory Visit

## 2024-04-01 ENCOUNTER — Other Ambulatory Visit: Payer: Self-pay

## 2024-04-01 ENCOUNTER — Ambulatory Visit
Admission: RE | Admit: 2024-04-01 | Discharge: 2024-04-01 | Disposition: A | Discharge status: Home / Self Care | Source: Ambulatory Visit | Attending: Radiation Oncology | Admitting: Radiation Oncology

## 2024-04-01 DIAGNOSIS — Z510 Encounter for antineoplastic radiation therapy: Secondary | ICD-10-CM | POA: Diagnosis not present

## 2024-04-01 LAB — RAD ONC ARIA SESSION SUMMARY
Course Elapsed Days: 7
Plan Fractions Treated to Date: 4
Plan Prescribed Dose Per Fraction: 2 Gy
Plan Total Fractions Prescribed: 40
Plan Total Prescribed Dose: 80 Gy
Reference Point Dosage Given to Date: 8 Gy
Reference Point Session Dosage Given: 2 Gy
Session Number: 4

## 2024-04-02 ENCOUNTER — Other Ambulatory Visit: Payer: Self-pay

## 2024-04-02 ENCOUNTER — Ambulatory Visit
Admission: RE | Admit: 2024-04-02 | Discharge: 2024-04-02 | Disposition: A | Discharge status: Home / Self Care | Source: Ambulatory Visit | Attending: Radiation Oncology | Admitting: Radiation Oncology

## 2024-04-02 DIAGNOSIS — Z510 Encounter for antineoplastic radiation therapy: Secondary | ICD-10-CM | POA: Diagnosis not present

## 2024-04-02 LAB — RAD ONC ARIA SESSION SUMMARY
Course Elapsed Days: 8
Plan Fractions Treated to Date: 5
Plan Prescribed Dose Per Fraction: 2 Gy
Plan Total Fractions Prescribed: 40
Plan Total Prescribed Dose: 80 Gy
Reference Point Dosage Given to Date: 10 Gy
Reference Point Session Dosage Given: 2 Gy
Session Number: 5

## 2024-04-03 ENCOUNTER — Ambulatory Visit
Admission: RE | Admit: 2024-04-03 | Discharge: 2024-04-03 | Disposition: A | Discharge status: Home / Self Care | Source: Ambulatory Visit | Attending: Radiation Oncology | Admitting: Radiation Oncology

## 2024-04-03 ENCOUNTER — Other Ambulatory Visit: Payer: Self-pay

## 2024-04-03 DIAGNOSIS — Z510 Encounter for antineoplastic radiation therapy: Secondary | ICD-10-CM | POA: Diagnosis not present

## 2024-04-03 LAB — RAD ONC ARIA SESSION SUMMARY
Course Elapsed Days: 9
Plan Fractions Treated to Date: 6
Plan Prescribed Dose Per Fraction: 2 Gy
Plan Total Fractions Prescribed: 40
Plan Total Prescribed Dose: 80 Gy
Reference Point Dosage Given to Date: 12 Gy
Reference Point Session Dosage Given: 2 Gy
Session Number: 6

## 2024-04-07 ENCOUNTER — Other Ambulatory Visit: Payer: Self-pay

## 2024-04-07 ENCOUNTER — Ambulatory Visit
Admission: RE | Admit: 2024-04-07 | Discharge: 2024-04-07 | Disposition: A | Discharge status: Home / Self Care | Source: Ambulatory Visit | Attending: Radiation Oncology | Admitting: Radiation Oncology

## 2024-04-07 DIAGNOSIS — C61 Malignant neoplasm of prostate: Secondary | ICD-10-CM | POA: Insufficient documentation

## 2024-04-07 DIAGNOSIS — Z510 Encounter for antineoplastic radiation therapy: Secondary | ICD-10-CM | POA: Diagnosis present

## 2024-04-07 LAB — RAD ONC ARIA SESSION SUMMARY
Course Elapsed Days: 13
Plan Fractions Treated to Date: 7
Plan Prescribed Dose Per Fraction: 2 Gy
Plan Total Fractions Prescribed: 40
Plan Total Prescribed Dose: 80 Gy
Reference Point Dosage Given to Date: 14 Gy
Reference Point Session Dosage Given: 2 Gy
Session Number: 7

## 2024-04-08 ENCOUNTER — Other Ambulatory Visit: Payer: Self-pay

## 2024-04-08 ENCOUNTER — Ambulatory Visit
Admission: RE | Admit: 2024-04-08 | Discharge: 2024-04-08 | Disposition: A | Discharge status: Home / Self Care | Source: Ambulatory Visit | Attending: Radiation Oncology | Admitting: Radiation Oncology

## 2024-04-08 DIAGNOSIS — Z510 Encounter for antineoplastic radiation therapy: Secondary | ICD-10-CM | POA: Diagnosis not present

## 2024-04-08 LAB — RAD ONC ARIA SESSION SUMMARY
Course Elapsed Days: 14
Plan Fractions Treated to Date: 8
Plan Prescribed Dose Per Fraction: 2 Gy
Plan Total Fractions Prescribed: 40
Plan Total Prescribed Dose: 80 Gy
Reference Point Dosage Given to Date: 16 Gy
Reference Point Session Dosage Given: 2 Gy
Session Number: 8

## 2024-04-09 ENCOUNTER — Other Ambulatory Visit: Payer: Self-pay

## 2024-04-09 ENCOUNTER — Ambulatory Visit
Admission: RE | Admit: 2024-04-09 | Discharge: 2024-04-09 | Disposition: A | Discharge status: Home / Self Care | Source: Ambulatory Visit | Attending: Radiation Oncology | Admitting: Radiation Oncology

## 2024-04-09 DIAGNOSIS — Z510 Encounter for antineoplastic radiation therapy: Secondary | ICD-10-CM | POA: Diagnosis not present

## 2024-04-09 LAB — RAD ONC ARIA SESSION SUMMARY
Course Elapsed Days: 15
Plan Fractions Treated to Date: 9
Plan Prescribed Dose Per Fraction: 2 Gy
Plan Total Fractions Prescribed: 40
Plan Total Prescribed Dose: 80 Gy
Reference Point Dosage Given to Date: 18 Gy
Reference Point Session Dosage Given: 2 Gy
Session Number: 9

## 2024-04-10 ENCOUNTER — Ambulatory Visit

## 2024-04-13 ENCOUNTER — Inpatient Hospital Stay

## 2024-04-13 ENCOUNTER — Other Ambulatory Visit: Payer: Self-pay

## 2024-04-13 ENCOUNTER — Ambulatory Visit
Admission: RE | Admit: 2024-04-13 | Discharge: 2024-04-13 | Disposition: A | Discharge status: Home / Self Care | Source: Ambulatory Visit | Attending: Radiation Oncology | Admitting: Radiation Oncology

## 2024-04-13 DIAGNOSIS — Z510 Encounter for antineoplastic radiation therapy: Secondary | ICD-10-CM | POA: Diagnosis not present

## 2024-04-13 DIAGNOSIS — C61 Malignant neoplasm of prostate: Secondary | ICD-10-CM | POA: Insufficient documentation

## 2024-04-13 LAB — RAD ONC ARIA SESSION SUMMARY
Course Elapsed Days: 19
Plan Fractions Treated to Date: 10
Plan Prescribed Dose Per Fraction: 2 Gy
Plan Total Fractions Prescribed: 40
Plan Total Prescribed Dose: 80 Gy
Reference Point Dosage Given to Date: 20 Gy
Reference Point Session Dosage Given: 2 Gy
Session Number: 10

## 2024-04-13 LAB — CBC (CANCER CENTER ONLY)
HCT: 47.1 % (ref 39.0–52.0)
Hemoglobin: 15.6 g/dL (ref 13.0–17.0)
MCH: 30.2 pg (ref 26.0–34.0)
MCHC: 33.1 g/dL (ref 30.0–36.0)
MCV: 91.1 fL (ref 80.0–100.0)
Platelet Count: 208 K/uL (ref 150–400)
RBC: 5.17 MIL/uL (ref 4.22–5.81)
RDW: 12.6 % (ref 11.5–15.5)
WBC Count: 8 K/uL (ref 4.0–10.5)
nRBC: 0 % (ref 0.0–0.2)

## 2024-04-14 ENCOUNTER — Other Ambulatory Visit: Payer: Self-pay

## 2024-04-14 ENCOUNTER — Ambulatory Visit
Admission: RE | Admit: 2024-04-14 | Discharge: 2024-04-14 | Disposition: A | Discharge status: Home / Self Care | Source: Ambulatory Visit | Attending: Radiation Oncology | Admitting: Radiation Oncology

## 2024-04-14 DIAGNOSIS — Z510 Encounter for antineoplastic radiation therapy: Secondary | ICD-10-CM | POA: Diagnosis not present

## 2024-04-14 LAB — RAD ONC ARIA SESSION SUMMARY
Course Elapsed Days: 20
Plan Fractions Treated to Date: 11
Plan Prescribed Dose Per Fraction: 2 Gy
Plan Total Fractions Prescribed: 40
Plan Total Prescribed Dose: 80 Gy
Reference Point Dosage Given to Date: 22 Gy
Reference Point Session Dosage Given: 2 Gy
Session Number: 11

## 2024-04-15 ENCOUNTER — Other Ambulatory Visit: Payer: Self-pay

## 2024-04-15 ENCOUNTER — Ambulatory Visit
Admission: RE | Admit: 2024-04-15 | Discharge: 2024-04-15 | Disposition: A | Discharge status: Home / Self Care | Source: Ambulatory Visit | Attending: Radiation Oncology | Admitting: Radiation Oncology

## 2024-04-15 DIAGNOSIS — Z510 Encounter for antineoplastic radiation therapy: Secondary | ICD-10-CM | POA: Diagnosis not present

## 2024-04-15 LAB — RAD ONC ARIA SESSION SUMMARY
Course Elapsed Days: 21
Plan Fractions Treated to Date: 12
Plan Prescribed Dose Per Fraction: 2 Gy
Plan Total Fractions Prescribed: 40
Plan Total Prescribed Dose: 80 Gy
Reference Point Dosage Given to Date: 24 Gy
Reference Point Session Dosage Given: 2 Gy
Session Number: 12

## 2024-04-16 ENCOUNTER — Ambulatory Visit
Admission: RE | Admit: 2024-04-16 | Discharge: 2024-04-16 | Disposition: A | Discharge status: Home / Self Care | Source: Ambulatory Visit | Attending: Radiation Oncology | Admitting: Radiation Oncology

## 2024-04-16 ENCOUNTER — Other Ambulatory Visit: Payer: Self-pay

## 2024-04-16 DIAGNOSIS — Z510 Encounter for antineoplastic radiation therapy: Secondary | ICD-10-CM | POA: Diagnosis not present

## 2024-04-16 LAB — RAD ONC ARIA SESSION SUMMARY
Course Elapsed Days: 22
Plan Fractions Treated to Date: 13
Plan Prescribed Dose Per Fraction: 2 Gy
Plan Total Fractions Prescribed: 40
Plan Total Prescribed Dose: 80 Gy
Reference Point Dosage Given to Date: 26 Gy
Reference Point Session Dosage Given: 2 Gy
Session Number: 13

## 2024-04-17 ENCOUNTER — Other Ambulatory Visit: Payer: Self-pay

## 2024-04-17 ENCOUNTER — Ambulatory Visit
Admission: RE | Admit: 2024-04-17 | Discharge: 2024-04-17 | Disposition: A | Discharge status: Home / Self Care | Source: Ambulatory Visit | Attending: Radiation Oncology | Admitting: Radiation Oncology

## 2024-04-17 DIAGNOSIS — Z510 Encounter for antineoplastic radiation therapy: Secondary | ICD-10-CM | POA: Diagnosis not present

## 2024-04-17 LAB — RAD ONC ARIA SESSION SUMMARY
Course Elapsed Days: 23
Plan Fractions Treated to Date: 14
Plan Prescribed Dose Per Fraction: 2 Gy
Plan Total Fractions Prescribed: 40
Plan Total Prescribed Dose: 80 Gy
Reference Point Dosage Given to Date: 28 Gy
Reference Point Session Dosage Given: 2 Gy
Session Number: 14

## 2024-04-19 ENCOUNTER — Ambulatory Visit

## 2024-04-20 ENCOUNTER — Other Ambulatory Visit: Payer: Self-pay

## 2024-04-20 ENCOUNTER — Ambulatory Visit
Admission: RE | Admit: 2024-04-20 | Discharge: 2024-04-20 | Disposition: A | Discharge status: Home / Self Care | Source: Ambulatory Visit | Attending: Radiation Oncology | Admitting: Radiation Oncology

## 2024-04-20 DIAGNOSIS — Z510 Encounter for antineoplastic radiation therapy: Secondary | ICD-10-CM | POA: Diagnosis not present

## 2024-04-20 LAB — RAD ONC ARIA SESSION SUMMARY
Course Elapsed Days: 26
Plan Fractions Treated to Date: 15
Plan Prescribed Dose Per Fraction: 2 Gy
Plan Total Fractions Prescribed: 40
Plan Total Prescribed Dose: 80 Gy
Reference Point Dosage Given to Date: 30 Gy
Reference Point Session Dosage Given: 2 Gy
Session Number: 15

## 2024-04-21 ENCOUNTER — Other Ambulatory Visit: Payer: Self-pay

## 2024-04-21 ENCOUNTER — Ambulatory Visit
Admission: RE | Admit: 2024-04-21 | Discharge: 2024-04-21 | Disposition: A | Discharge status: Home / Self Care | Source: Ambulatory Visit | Attending: Radiation Oncology | Admitting: Radiation Oncology

## 2024-04-21 DIAGNOSIS — Z510 Encounter for antineoplastic radiation therapy: Secondary | ICD-10-CM | POA: Diagnosis not present

## 2024-04-21 LAB — RAD ONC ARIA SESSION SUMMARY
Course Elapsed Days: 27
Plan Fractions Treated to Date: 16
Plan Prescribed Dose Per Fraction: 2 Gy
Plan Total Fractions Prescribed: 40
Plan Total Prescribed Dose: 80 Gy
Reference Point Dosage Given to Date: 32 Gy
Reference Point Session Dosage Given: 2 Gy
Session Number: 16

## 2024-04-22 ENCOUNTER — Ambulatory Visit

## 2024-04-23 ENCOUNTER — Ambulatory Visit

## 2024-04-24 ENCOUNTER — Ambulatory Visit

## 2024-04-25 ENCOUNTER — Other Ambulatory Visit: Payer: Self-pay | Admitting: Family Medicine

## 2024-04-25 DIAGNOSIS — E782 Mixed hyperlipidemia: Secondary | ICD-10-CM

## 2024-04-25 DIAGNOSIS — N138 Other obstructive and reflux uropathy: Secondary | ICD-10-CM

## 2024-04-25 DIAGNOSIS — G4700 Insomnia, unspecified: Secondary | ICD-10-CM

## 2024-04-25 DIAGNOSIS — F4310 Post-traumatic stress disorder, unspecified: Secondary | ICD-10-CM

## 2024-04-27 ENCOUNTER — Other Ambulatory Visit: Payer: Self-pay

## 2024-04-27 ENCOUNTER — Inpatient Hospital Stay

## 2024-04-27 ENCOUNTER — Ambulatory Visit

## 2024-04-27 ENCOUNTER — Ambulatory Visit: Payer: Self-pay

## 2024-04-27 ENCOUNTER — Ambulatory Visit
Admission: RE | Admit: 2024-04-27 | Discharge: 2024-04-27 | Disposition: A | Discharge status: Home / Self Care | Source: Ambulatory Visit | Attending: Radiation Oncology | Admitting: Radiation Oncology

## 2024-04-27 DIAGNOSIS — Z510 Encounter for antineoplastic radiation therapy: Secondary | ICD-10-CM | POA: Diagnosis not present

## 2024-04-27 DIAGNOSIS — C61 Malignant neoplasm of prostate: Secondary | ICD-10-CM

## 2024-04-27 LAB — CBC (CANCER CENTER ONLY)
HCT: 49.9 % (ref 39.0–52.0)
Hemoglobin: 17.1 g/dL — ABNORMAL HIGH (ref 13.0–17.0)
MCH: 30.4 pg (ref 26.0–34.0)
MCHC: 34.3 g/dL (ref 30.0–36.0)
MCV: 88.8 fL (ref 80.0–100.0)
Platelet Count: 220 K/uL (ref 150–400)
RBC: 5.62 MIL/uL (ref 4.22–5.81)
RDW: 12.4 % (ref 11.5–15.5)
WBC Count: 9.6 K/uL (ref 4.0–10.5)
nRBC: 0 % (ref 0.0–0.2)

## 2024-04-27 LAB — RAD ONC ARIA SESSION SUMMARY
Course Elapsed Days: 33
Plan Fractions Treated to Date: 17
Plan Prescribed Dose Per Fraction: 2 Gy
Plan Total Fractions Prescribed: 40
Plan Total Prescribed Dose: 80 Gy
Reference Point Dosage Given to Date: 34 Gy
Reference Point Session Dosage Given: 2 Gy
Session Number: 17

## 2024-04-27 NOTE — Telephone Encounter (Signed)
 FYI Only or Action Required?: Action required by provider: update on patient condition.  Patient was last seen in primary care on 09/13/2023 by Glenard Mire, MD.  Called Nurse Triage reporting Diarrhea, Fever, and Chills.  Symptoms began several days ago.  Interventions attempted: Nothing.  Symptoms are: mild diarrhea (couple of episodes), feels feverish, chills, nausea, poor fluid intake (drinking 0.5-1 glasses of water  daily unchanged.  Triage Disposition: Go to ED Now (or PCP Triage)  Patient/caregiver understands and will follow disposition?: No, wishes to speak with PCP            Message from [facility] sent at 04/27/2024 11:28 AM EDT  Summary: Diarrhea   Chills, fever, diarrhea, patient also states he ran out of blood pressure medication patient declined agent submitting a refill. In radiation treatment for prostate cancer, advised to call primary care provider before he can go in for another treatment.  [place]  [phone]         Reason for Disposition  [1] Drinking very little AND [2] dehydration suspected (e.g., no urine > 12 hours, very dry mouth, very lightheaded)  Answer Assessment - Initial Assessment Questions Advised patient to go to ED and wear a mask due to concern for dehydration as he drinks a half to 1 glass of water  daily. He states he has been doing that for awhile. Patient states he would like to try drinking more fluids and he has an appt this afternoon so he won't go to the ED. He is asking what his PCP thinks of his symptoms. Advised also for patient to call his oncologist and inform them of his current symptoms. He states he spoke with a nurse from his cancer center who told him the symptoms can be side effects from his treatment, but he states she did not advise any further (I.e. home care/appt/ED/follow up with PCP). Reiterated to patient to go to ED for fluids and for fever/chills/diarrhea. He declined and states he will drink fluids. Called  [facility] and notified Melissa of ED refusal.    1. DIARRHEA SEVERITY: How bad is the diarrhea? How many extra stools have you had in the past 24 hours than normal?      Not a lot. Couple of episodes and small amount is coming out.  2. ONSET: When did the diarrhea begin?      2 days.  3. STOOL DESCRIPTION:  How loose or watery is the diarrhea? What is the stool color? Is there any blood or mucous in the stool?     Half loose and half watery. Delores. No blood or mucous.  4. VOMITING: Are you also vomiting? If Yes, ask: How many times in the past 24 hours?      No.  5. ABDOMEN PAIN: Are you having any abdomen pain? If Yes, ask: What does it feel like? (e.g., crampy, dull, intermittent, constant)      No.  6. ABDOMEN PAIN SEVERITY: If present, ask: How bad is the pain?  (e.g., Scale 1-10; mild, moderate, or severe)     N/A.  7. ORAL INTAKE: If vomiting, Have you been able to drink liquids? How much fluids have you had in the past 24 hours?     Not vomiting.  8. HYDRATION: Any signs of dehydration? (e.g., dry mouth [not just dry lips], too weak to stand, dizziness, new weight loss) When did you last urinate?     He states  he has urinated today but not much. His wife states she  has to keep on top of him to drink water . Denies any signs of dehydration. Patient states at most he drinks half a glass to 1 glass of water .  9. EXPOSURE: Have you traveled to a foreign country recently? Have you been exposed to anyone with diarrhea? Could you have eaten any food that was spoiled?     No.  10. CANCER: What type of cancer do you have?       Prostate cancer.  11. CANCER - TREATMENT: What cancer treatments have you received? When did you last take or receive them? (e.g., chemotherapy, immunotherapy, radiation, or recent surgery). Note: Triager with access to patient's medical record should review treatments and administration dates.       Radiation therapy,  last treatment was 3-4 days ago. He was supposed to go in today but can't due to being sick.  12. CANCER - NEUTROPENIA RISK: Have you received chemotherapy recently? If Yes, ask: When was it and what was your last WBC and ANC (absolute neutrophil count)? Were you told that your white cell count was low? Note: Triager with access to patient's medical record should review most recent labs. An ANC less than 1,000 - 1,500 means that the neutrophils are low and the immune system is weak.       No chemotherapy.  13. C DIFF RSK: Have you ever had c-difficile (C-Diff) diarrhea?        No.  14. DIARRHEA MEDICINES: Are you taking any medicines right now to make the diarrhea better? If Yes, ask: What drugs are you taking? (e.g., Imodium , Lomotil)       No.  15. OTHER SYMPTOMS: Do you have any other symptoms? (e.g., fever)       He states he feels feverish with chills (has not checked his temperature); nausea; lightheaded.  Protocols used: Cancer - Diarrhea-A-AH

## 2024-04-28 ENCOUNTER — Other Ambulatory Visit: Payer: Self-pay | Admitting: *Deleted

## 2024-04-28 ENCOUNTER — Other Ambulatory Visit: Payer: Self-pay

## 2024-04-28 ENCOUNTER — Ambulatory Visit
Admission: RE | Admit: 2024-04-28 | Discharge: 2024-04-28 | Disposition: A | Discharge status: Home / Self Care | Source: Ambulatory Visit | Attending: Radiation Oncology | Admitting: Radiation Oncology

## 2024-04-28 DIAGNOSIS — Z510 Encounter for antineoplastic radiation therapy: Secondary | ICD-10-CM | POA: Diagnosis not present

## 2024-04-28 LAB — RAD ONC ARIA SESSION SUMMARY
Course Elapsed Days: 34
Plan Fractions Treated to Date: 18
Plan Prescribed Dose Per Fraction: 2 Gy
Plan Total Fractions Prescribed: 40
Plan Total Prescribed Dose: 80 Gy
Reference Point Dosage Given to Date: 36 Gy
Reference Point Session Dosage Given: 2 Gy
Session Number: 18

## 2024-04-29 ENCOUNTER — Other Ambulatory Visit: Payer: Self-pay | Admitting: *Deleted

## 2024-04-29 ENCOUNTER — Other Ambulatory Visit: Payer: Self-pay

## 2024-04-29 ENCOUNTER — Ambulatory Visit

## 2024-04-29 ENCOUNTER — Ambulatory Visit
Admission: RE | Admit: 2024-04-29 | Discharge: 2024-04-29 | Disposition: A | Discharge status: Home / Self Care | Source: Ambulatory Visit | Attending: Radiation Oncology | Admitting: Radiation Oncology

## 2024-04-29 DIAGNOSIS — Z510 Encounter for antineoplastic radiation therapy: Secondary | ICD-10-CM | POA: Diagnosis not present

## 2024-04-29 LAB — RAD ONC ARIA SESSION SUMMARY
Course Elapsed Days: 35
Plan Fractions Treated to Date: 19
Plan Prescribed Dose Per Fraction: 2 Gy
Plan Total Fractions Prescribed: 40
Plan Total Prescribed Dose: 80 Gy
Reference Point Dosage Given to Date: 38 Gy
Reference Point Session Dosage Given: 2 Gy
Session Number: 19

## 2024-04-29 MED ORDER — LANSOPRAZOLE 30 MG PO CPDR: 30.0000 mg | DELAYED_RELEASE_CAPSULE | Freq: Every day | ORAL | 0 refills | Status: AC

## 2024-04-30 ENCOUNTER — Ambulatory Visit
Admission: RE | Admit: 2024-04-30 | Discharge: 2024-04-30 | Disposition: A | Discharge status: Home / Self Care | Source: Ambulatory Visit | Attending: Radiation Oncology | Admitting: Radiation Oncology

## 2024-04-30 ENCOUNTER — Other Ambulatory Visit: Payer: Self-pay

## 2024-04-30 DIAGNOSIS — Z510 Encounter for antineoplastic radiation therapy: Secondary | ICD-10-CM | POA: Diagnosis not present

## 2024-04-30 LAB — RAD ONC ARIA SESSION SUMMARY
Course Elapsed Days: 36
Plan Fractions Treated to Date: 20
Plan Prescribed Dose Per Fraction: 2 Gy
Plan Total Fractions Prescribed: 40
Plan Total Prescribed Dose: 80 Gy
Reference Point Dosage Given to Date: 40 Gy
Reference Point Session Dosage Given: 2 Gy
Session Number: 20

## 2024-05-01 ENCOUNTER — Other Ambulatory Visit: Payer: Self-pay

## 2024-05-01 ENCOUNTER — Ambulatory Visit
Admission: RE | Admit: 2024-05-01 | Discharge: 2024-05-01 | Disposition: A | Discharge status: Home / Self Care | Source: Ambulatory Visit | Attending: Radiation Oncology | Admitting: Radiation Oncology

## 2024-05-01 DIAGNOSIS — Z510 Encounter for antineoplastic radiation therapy: Secondary | ICD-10-CM | POA: Diagnosis not present

## 2024-05-01 LAB — RAD ONC ARIA SESSION SUMMARY
Course Elapsed Days: 37
Plan Fractions Treated to Date: 21
Plan Prescribed Dose Per Fraction: 2 Gy
Plan Total Fractions Prescribed: 40
Plan Total Prescribed Dose: 80 Gy
Reference Point Dosage Given to Date: 42 Gy
Reference Point Session Dosage Given: 2 Gy
Session Number: 21

## 2024-05-04 ENCOUNTER — Other Ambulatory Visit: Payer: Self-pay

## 2024-05-04 ENCOUNTER — Ambulatory Visit
Admission: RE | Admit: 2024-05-04 | Discharge: 2024-05-04 | Disposition: A | Discharge status: Home / Self Care | Source: Ambulatory Visit | Attending: Radiation Oncology | Admitting: Radiation Oncology

## 2024-05-04 DIAGNOSIS — Z510 Encounter for antineoplastic radiation therapy: Secondary | ICD-10-CM | POA: Diagnosis not present

## 2024-05-04 LAB — RAD ONC ARIA SESSION SUMMARY
Course Elapsed Days: 40
Plan Fractions Treated to Date: 22
Plan Prescribed Dose Per Fraction: 2 Gy
Plan Total Fractions Prescribed: 40
Plan Total Prescribed Dose: 80 Gy
Reference Point Dosage Given to Date: 44 Gy
Reference Point Session Dosage Given: 2 Gy
Session Number: 22

## 2024-05-05 ENCOUNTER — Other Ambulatory Visit: Payer: Self-pay

## 2024-05-05 ENCOUNTER — Ambulatory Visit
Admission: RE | Admit: 2024-05-05 | Discharge: 2024-05-05 | Disposition: A | Source: Ambulatory Visit | Attending: Radiation Oncology | Admitting: Radiation Oncology

## 2024-05-05 DIAGNOSIS — Z510 Encounter for antineoplastic radiation therapy: Secondary | ICD-10-CM | POA: Diagnosis not present

## 2024-05-05 LAB — RAD ONC ARIA SESSION SUMMARY
Course Elapsed Days: 41
Plan Fractions Treated to Date: 23
Plan Prescribed Dose Per Fraction: 2 Gy
Plan Total Fractions Prescribed: 40
Plan Total Prescribed Dose: 80 Gy
Reference Point Dosage Given to Date: 46 Gy
Reference Point Session Dosage Given: 2 Gy
Session Number: 23

## 2024-05-06 ENCOUNTER — Ambulatory Visit
Admission: RE | Admit: 2024-05-06 | Discharge: 2024-05-06 | Disposition: A | Discharge status: Home / Self Care | Source: Ambulatory Visit | Attending: Radiation Oncology | Admitting: Radiation Oncology

## 2024-05-06 ENCOUNTER — Other Ambulatory Visit: Payer: Self-pay

## 2024-05-06 DIAGNOSIS — Z510 Encounter for antineoplastic radiation therapy: Secondary | ICD-10-CM | POA: Insufficient documentation

## 2024-05-06 DIAGNOSIS — C61 Malignant neoplasm of prostate: Secondary | ICD-10-CM | POA: Insufficient documentation

## 2024-05-06 LAB — RAD ONC ARIA SESSION SUMMARY
Course Elapsed Days: 42
Plan Fractions Treated to Date: 24
Plan Prescribed Dose Per Fraction: 2 Gy
Plan Total Fractions Prescribed: 40
Plan Total Prescribed Dose: 80 Gy
Reference Point Dosage Given to Date: 48 Gy
Reference Point Session Dosage Given: 2 Gy
Session Number: 24

## 2024-05-07 ENCOUNTER — Other Ambulatory Visit: Payer: Self-pay

## 2024-05-07 ENCOUNTER — Ambulatory Visit
Admission: RE | Admit: 2024-05-07 | Discharge: 2024-05-07 | Disposition: A | Discharge status: Home / Self Care | Source: Ambulatory Visit | Attending: Radiation Oncology | Admitting: Radiation Oncology

## 2024-05-07 DIAGNOSIS — Z510 Encounter for antineoplastic radiation therapy: Secondary | ICD-10-CM | POA: Diagnosis not present

## 2024-05-07 LAB — RAD ONC ARIA SESSION SUMMARY
Course Elapsed Days: 43
Plan Fractions Treated to Date: 25
Plan Prescribed Dose Per Fraction: 2 Gy
Plan Total Fractions Prescribed: 40
Plan Total Prescribed Dose: 80 Gy
Reference Point Dosage Given to Date: 50 Gy
Reference Point Session Dosage Given: 2 Gy
Session Number: 25

## 2024-05-08 ENCOUNTER — Other Ambulatory Visit: Payer: Self-pay

## 2024-05-08 ENCOUNTER — Ambulatory Visit
Admission: RE | Admit: 2024-05-08 | Discharge: 2024-05-08 | Disposition: A | Discharge status: Home / Self Care | Source: Ambulatory Visit | Attending: Radiation Oncology | Admitting: Radiation Oncology

## 2024-05-08 DIAGNOSIS — Z510 Encounter for antineoplastic radiation therapy: Secondary | ICD-10-CM | POA: Diagnosis not present

## 2024-05-08 LAB — RAD ONC ARIA SESSION SUMMARY
Course Elapsed Days: 44
Plan Fractions Treated to Date: 26
Plan Prescribed Dose Per Fraction: 2 Gy
Plan Total Fractions Prescribed: 40
Plan Total Prescribed Dose: 80 Gy
Reference Point Dosage Given to Date: 52 Gy
Reference Point Session Dosage Given: 2 Gy
Session Number: 26

## 2024-05-11 ENCOUNTER — Other Ambulatory Visit: Payer: Self-pay

## 2024-05-11 ENCOUNTER — Inpatient Hospital Stay

## 2024-05-11 ENCOUNTER — Ambulatory Visit
Admission: RE | Admit: 2024-05-11 | Discharge: 2024-05-11 | Disposition: A | Discharge status: Home / Self Care | Source: Ambulatory Visit | Attending: Radiation Oncology | Admitting: Radiation Oncology

## 2024-05-11 DIAGNOSIS — C61 Malignant neoplasm of prostate: Secondary | ICD-10-CM | POA: Insufficient documentation

## 2024-05-11 DIAGNOSIS — Z510 Encounter for antineoplastic radiation therapy: Secondary | ICD-10-CM | POA: Diagnosis not present

## 2024-05-11 LAB — RAD ONC ARIA SESSION SUMMARY
Course Elapsed Days: 47
Plan Fractions Treated to Date: 27
Plan Prescribed Dose Per Fraction: 2 Gy
Plan Total Fractions Prescribed: 40
Plan Total Prescribed Dose: 80 Gy
Reference Point Dosage Given to Date: 54 Gy
Reference Point Session Dosage Given: 2 Gy
Session Number: 27

## 2024-05-11 LAB — CBC (CANCER CENTER ONLY)
HCT: 48.4 % (ref 39.0–52.0)
Hemoglobin: 16.4 g/dL (ref 13.0–17.0)
MCH: 30.3 pg (ref 26.0–34.0)
MCHC: 33.9 g/dL (ref 30.0–36.0)
MCV: 89.3 fL (ref 80.0–100.0)
Platelet Count: 214 K/uL (ref 150–400)
RBC: 5.42 MIL/uL (ref 4.22–5.81)
RDW: 12.7 % (ref 11.5–15.5)
WBC Count: 6.8 K/uL (ref 4.0–10.5)
nRBC: 0 % (ref 0.0–0.2)

## 2024-05-12 ENCOUNTER — Ambulatory Visit
Admission: RE | Admit: 2024-05-12 | Discharge: 2024-05-12 | Disposition: A | Discharge status: Home / Self Care | Source: Ambulatory Visit | Attending: Radiation Oncology | Admitting: Radiation Oncology

## 2024-05-12 ENCOUNTER — Other Ambulatory Visit: Payer: Self-pay

## 2024-05-12 DIAGNOSIS — Z510 Encounter for antineoplastic radiation therapy: Secondary | ICD-10-CM | POA: Diagnosis not present

## 2024-05-12 LAB — RAD ONC ARIA SESSION SUMMARY
Course Elapsed Days: 48
Plan Fractions Treated to Date: 28
Plan Prescribed Dose Per Fraction: 2 Gy
Plan Total Fractions Prescribed: 40
Plan Total Prescribed Dose: 80 Gy
Reference Point Dosage Given to Date: 56 Gy
Reference Point Session Dosage Given: 2 Gy
Session Number: 28

## 2024-05-13 ENCOUNTER — Ambulatory Visit
Admission: RE | Admit: 2024-05-13 | Discharge: 2024-05-13 | Disposition: A | Source: Ambulatory Visit | Attending: Radiation Oncology | Admitting: Radiation Oncology

## 2024-05-13 ENCOUNTER — Other Ambulatory Visit: Payer: Self-pay

## 2024-05-13 DIAGNOSIS — Z510 Encounter for antineoplastic radiation therapy: Secondary | ICD-10-CM | POA: Diagnosis not present

## 2024-05-13 LAB — RAD ONC ARIA SESSION SUMMARY
Course Elapsed Days: 49
Plan Fractions Treated to Date: 29
Plan Prescribed Dose Per Fraction: 2 Gy
Plan Total Fractions Prescribed: 40
Plan Total Prescribed Dose: 80 Gy
Reference Point Dosage Given to Date: 58 Gy
Reference Point Session Dosage Given: 2 Gy
Session Number: 29

## 2024-05-14 ENCOUNTER — Other Ambulatory Visit: Payer: Self-pay

## 2024-05-14 ENCOUNTER — Ambulatory Visit
Admission: RE | Admit: 2024-05-14 | Discharge: 2024-05-14 | Disposition: A | Discharge status: Home / Self Care | Source: Ambulatory Visit | Attending: Radiation Oncology | Admitting: Radiation Oncology

## 2024-05-14 DIAGNOSIS — Z510 Encounter for antineoplastic radiation therapy: Secondary | ICD-10-CM | POA: Diagnosis not present

## 2024-05-14 LAB — RAD ONC ARIA SESSION SUMMARY
Course Elapsed Days: 50
Plan Fractions Treated to Date: 30
Plan Prescribed Dose Per Fraction: 2 Gy
Plan Total Fractions Prescribed: 40
Plan Total Prescribed Dose: 80 Gy
Reference Point Dosage Given to Date: 60 Gy
Reference Point Session Dosage Given: 2 Gy
Session Number: 30

## 2024-05-15 ENCOUNTER — Other Ambulatory Visit: Payer: Self-pay

## 2024-05-15 ENCOUNTER — Ambulatory Visit
Admission: RE | Admit: 2024-05-15 | Discharge: 2024-05-15 | Disposition: A | Discharge status: Home / Self Care | Source: Ambulatory Visit | Attending: Radiation Oncology | Admitting: Radiation Oncology

## 2024-05-15 DIAGNOSIS — Z510 Encounter for antineoplastic radiation therapy: Secondary | ICD-10-CM | POA: Diagnosis not present

## 2024-05-15 LAB — RAD ONC ARIA SESSION SUMMARY
Course Elapsed Days: 51
Plan Fractions Treated to Date: 31
Plan Prescribed Dose Per Fraction: 2 Gy
Plan Total Fractions Prescribed: 40
Plan Total Prescribed Dose: 80 Gy
Reference Point Dosage Given to Date: 62 Gy
Reference Point Session Dosage Given: 2 Gy
Session Number: 31

## 2024-05-17 ENCOUNTER — Ambulatory Visit

## 2024-05-18 ENCOUNTER — Other Ambulatory Visit: Payer: Self-pay

## 2024-05-18 ENCOUNTER — Ambulatory Visit
Admission: RE | Admit: 2024-05-18 | Discharge: 2024-05-18 | Disposition: A | Discharge status: Home / Self Care | Source: Ambulatory Visit | Attending: Radiation Oncology | Admitting: Radiation Oncology

## 2024-05-18 DIAGNOSIS — Z510 Encounter for antineoplastic radiation therapy: Secondary | ICD-10-CM | POA: Diagnosis not present

## 2024-05-18 LAB — RAD ONC ARIA SESSION SUMMARY
Course Elapsed Days: 54
Plan Fractions Treated to Date: 32
Plan Prescribed Dose Per Fraction: 2 Gy
Plan Total Fractions Prescribed: 40
Plan Total Prescribed Dose: 80 Gy
Reference Point Dosage Given to Date: 64 Gy
Reference Point Session Dosage Given: 2 Gy
Session Number: 32

## 2024-05-19 ENCOUNTER — Other Ambulatory Visit: Payer: Self-pay

## 2024-05-19 ENCOUNTER — Ambulatory Visit
Admission: RE | Admit: 2024-05-19 | Discharge: 2024-05-19 | Disposition: A | Source: Ambulatory Visit | Attending: Radiation Oncology | Admitting: Radiation Oncology

## 2024-05-19 DIAGNOSIS — Z510 Encounter for antineoplastic radiation therapy: Secondary | ICD-10-CM | POA: Diagnosis not present

## 2024-05-19 LAB — RAD ONC ARIA SESSION SUMMARY
Course Elapsed Days: 55
Plan Fractions Treated to Date: 33
Plan Prescribed Dose Per Fraction: 2 Gy
Plan Total Fractions Prescribed: 40
Plan Total Prescribed Dose: 80 Gy
Reference Point Dosage Given to Date: 66 Gy
Reference Point Session Dosage Given: 2 Gy
Session Number: 33

## 2024-05-20 ENCOUNTER — Other Ambulatory Visit: Payer: Self-pay

## 2024-05-20 ENCOUNTER — Ambulatory Visit

## 2024-05-20 ENCOUNTER — Ambulatory Visit
Admission: RE | Admit: 2024-05-20 | Discharge: 2024-05-20 | Disposition: A | Source: Ambulatory Visit | Attending: Radiation Oncology | Admitting: Radiation Oncology

## 2024-05-20 DIAGNOSIS — Z510 Encounter for antineoplastic radiation therapy: Secondary | ICD-10-CM | POA: Diagnosis not present

## 2024-05-20 LAB — RAD ONC ARIA SESSION SUMMARY
Course Elapsed Days: 56
Plan Fractions Treated to Date: 34
Plan Prescribed Dose Per Fraction: 2 Gy
Plan Total Fractions Prescribed: 40
Plan Total Prescribed Dose: 80 Gy
Reference Point Dosage Given to Date: 68 Gy
Reference Point Session Dosage Given: 2 Gy
Session Number: 34

## 2024-05-21 ENCOUNTER — Other Ambulatory Visit: Payer: Self-pay

## 2024-05-21 ENCOUNTER — Ambulatory Visit
Admission: RE | Admit: 2024-05-21 | Discharge: 2024-05-21 | Disposition: A | Discharge status: Home / Self Care | Source: Ambulatory Visit | Attending: Radiation Oncology | Admitting: Radiation Oncology

## 2024-05-21 ENCOUNTER — Ambulatory Visit

## 2024-05-21 DIAGNOSIS — Z510 Encounter for antineoplastic radiation therapy: Secondary | ICD-10-CM | POA: Diagnosis not present

## 2024-05-21 LAB — RAD ONC ARIA SESSION SUMMARY
Course Elapsed Days: 57
Plan Fractions Treated to Date: 35
Plan Prescribed Dose Per Fraction: 2 Gy
Plan Total Fractions Prescribed: 40
Plan Total Prescribed Dose: 80 Gy
Reference Point Dosage Given to Date: 70 Gy
Reference Point Session Dosage Given: 2 Gy
Session Number: 35

## 2024-05-22 ENCOUNTER — Ambulatory Visit

## 2024-05-22 ENCOUNTER — Other Ambulatory Visit: Payer: Self-pay

## 2024-05-22 ENCOUNTER — Ambulatory Visit
Admission: RE | Admit: 2024-05-22 | Discharge: 2024-05-22 | Disposition: A | Discharge status: Home / Self Care | Source: Ambulatory Visit | Attending: Radiation Oncology | Admitting: Radiation Oncology

## 2024-05-22 DIAGNOSIS — Z510 Encounter for antineoplastic radiation therapy: Secondary | ICD-10-CM | POA: Diagnosis not present

## 2024-05-22 LAB — RAD ONC ARIA SESSION SUMMARY
Course Elapsed Days: 58
Plan Fractions Treated to Date: 36
Plan Prescribed Dose Per Fraction: 2 Gy
Plan Total Fractions Prescribed: 40
Plan Total Prescribed Dose: 80 Gy
Reference Point Dosage Given to Date: 72 Gy
Reference Point Session Dosage Given: 2 Gy
Session Number: 36

## 2024-05-25 ENCOUNTER — Other Ambulatory Visit: Payer: Self-pay

## 2024-05-25 ENCOUNTER — Ambulatory Visit

## 2024-05-25 ENCOUNTER — Ambulatory Visit
Admission: RE | Admit: 2024-05-25 | Discharge: 2024-05-25 | Disposition: A | Discharge status: Home / Self Care | Source: Ambulatory Visit | Attending: Radiation Oncology | Admitting: Radiation Oncology

## 2024-05-25 DIAGNOSIS — Z510 Encounter for antineoplastic radiation therapy: Secondary | ICD-10-CM | POA: Diagnosis not present

## 2024-05-25 LAB — RAD ONC ARIA SESSION SUMMARY
Course Elapsed Days: 61
Plan Fractions Treated to Date: 37
Plan Prescribed Dose Per Fraction: 2 Gy
Plan Total Fractions Prescribed: 40
Plan Total Prescribed Dose: 80 Gy
Reference Point Dosage Given to Date: 74 Gy
Reference Point Session Dosage Given: 2 Gy
Session Number: 37

## 2024-05-26 ENCOUNTER — Ambulatory Visit

## 2024-05-26 ENCOUNTER — Ambulatory Visit
Admission: RE | Admit: 2024-05-26 | Discharge: 2024-05-26 | Disposition: A | Discharge status: Home / Self Care | Source: Ambulatory Visit | Attending: Radiation Oncology | Admitting: Radiation Oncology

## 2024-05-26 ENCOUNTER — Other Ambulatory Visit: Payer: Self-pay

## 2024-05-26 DIAGNOSIS — Z510 Encounter for antineoplastic radiation therapy: Secondary | ICD-10-CM | POA: Diagnosis not present

## 2024-05-26 LAB — RAD ONC ARIA SESSION SUMMARY
Course Elapsed Days: 62
Plan Fractions Treated to Date: 38
Plan Prescribed Dose Per Fraction: 2 Gy
Plan Total Fractions Prescribed: 40
Plan Total Prescribed Dose: 80 Gy
Reference Point Dosage Given to Date: 76 Gy
Reference Point Session Dosage Given: 2 Gy
Session Number: 38

## 2024-05-27 ENCOUNTER — Ambulatory Visit

## 2024-05-27 ENCOUNTER — Ambulatory Visit
Admission: RE | Admit: 2024-05-27 | Discharge: 2024-05-27 | Disposition: A | Source: Ambulatory Visit | Attending: Radiation Oncology | Admitting: Radiation Oncology

## 2024-05-27 ENCOUNTER — Other Ambulatory Visit: Payer: Self-pay

## 2024-05-27 DIAGNOSIS — Z510 Encounter for antineoplastic radiation therapy: Secondary | ICD-10-CM | POA: Diagnosis not present

## 2024-05-27 LAB — RAD ONC ARIA SESSION SUMMARY
Course Elapsed Days: 63
Plan Fractions Treated to Date: 39
Plan Prescribed Dose Per Fraction: 2 Gy
Plan Total Fractions Prescribed: 40
Plan Total Prescribed Dose: 80 Gy
Reference Point Dosage Given to Date: 78 Gy
Reference Point Session Dosage Given: 2 Gy
Session Number: 39

## 2024-05-28 ENCOUNTER — Ambulatory Visit

## 2024-05-28 ENCOUNTER — Ambulatory Visit
Admission: RE | Admit: 2024-05-28 | Discharge: 2024-05-28 | Disposition: A | Source: Ambulatory Visit | Attending: Radiation Oncology | Admitting: Radiation Oncology

## 2024-05-28 ENCOUNTER — Other Ambulatory Visit: Payer: Self-pay

## 2024-05-28 DIAGNOSIS — Z510 Encounter for antineoplastic radiation therapy: Secondary | ICD-10-CM | POA: Diagnosis not present

## 2024-05-28 LAB — RAD ONC ARIA SESSION SUMMARY
Course Elapsed Days: 64
Plan Fractions Treated to Date: 40
Plan Prescribed Dose Per Fraction: 2 Gy
Plan Total Fractions Prescribed: 40
Plan Total Prescribed Dose: 80 Gy
Reference Point Dosage Given to Date: 80 Gy
Reference Point Session Dosage Given: 2 Gy
Session Number: 40

## 2024-05-29 ENCOUNTER — Ambulatory Visit

## 2024-05-29 NOTE — Radiation Completion Notes (Signed)
 Patient Name: Willie Crane, Willie Crane MRN: [id number] Date of Birth: Sep 21, 1945 Referring Physician: DORETTE LORON, M.D. Date of Service: 2024-05-29 Radiation Oncologist: Marcey Penton, M.D. [facility] [place]                             RADIATION ONCOLOGY END OF TREATMENT NOTE     Diagnosis: C61 Malignant neoplasm of prostate Intent: Curative     HPI: Patient is a 77 year old male who presents with an elevated PSA in the 7 range.  He had been on finasteride  5 mg daily.  He has a history of bilateral nephrolithiasis and bilateral renal cysts.  He was seen by urology underwent transrectal ultrasound-guided biopsy showing 4 of 13 cores positive for adenocarcinoma.  1 core was positive for Gleason 9 (4+5).  Other core biopsies were positive for Gleason 6 (3+3) as well as Gleason 7 (4+3).  His prostate volume is approximately 60 cc.  It was normal on digital rectal exam.  MRI scan was performed prior to biopsy showing a PI-RADS category 5 lesion of the bilateral anterior fibromuscular stroma in the mid gland and apex.  There is also bilateral transition zone involvement.  There is no evidence of bone metastasis pelvic adenopathy seminal vesicle involvement or trans capsular spread.  Patient has been scheduled for PSMA PET scan.  He has been consulted about robotic prostatectomy is now seen for radiation oncology opinion.  He is fairly asymptomatic specifically denies increased lower urinary tract symptoms any change in his bowel function or bone pain.  Patient was having some lower abdominal pain states he is not eating much and he was hypertensive today on examination      ==========DELIVERED PLANS==========  First Treatment Date: 2024-03-25 Last Treatment Date: 2024-05-28   Plan Name: Prostate Site: Prostate Technique: IMRT Mode: Photon Dose Per Fraction: 2 Gy Prescribed Dose (Delivered / Prescribed): 80 Gy / 80 Gy Prescribed Fxs (Delivered / Prescribed): 40 / 40      ==========ON TREATMENT VISIT DATES========== 2024-04-07, 2024-04-14, 2024-04-21, 2024-04-28, 2024-05-05, 2024-05-12, 2024-05-19, 2024-05-26     ==========UPCOMING VISITS========== 07/06/2024 [facility] RAD ONCOLOGY FOLLOW UP 30 Chrystal, Marcey, MD        ==========APPENDIX - ON TREATMENT VISIT NOTES==========   See weekly On Treatment Notes in Epic for details in the Media tab (listed as Progress notes on the On Treatment Visit Dates listed above).

## 2024-06-01 ENCOUNTER — Ambulatory Visit

## 2024-07-01 ENCOUNTER — Ambulatory Visit: Admitting: Radiation Oncology

## 2024-07-06 ENCOUNTER — Ambulatory Visit
Admission: RE | Admit: 2024-07-06 | Discharge: 2024-07-06 | Disposition: A | Discharge status: Home / Self Care | Source: Ambulatory Visit | Attending: Radiation Oncology | Admitting: Radiation Oncology

## 2024-07-06 ENCOUNTER — Encounter: Payer: Self-pay | Admitting: Radiation Oncology

## 2024-07-06 VITALS — BP 189/87 | HR 84 | Temp 98.0°F | Resp 18 | Ht 66.0 in | Wt 227.5 lb

## 2024-07-06 DIAGNOSIS — Z923 Personal history of irradiation: Secondary | ICD-10-CM | POA: Diagnosis not present

## 2024-07-06 DIAGNOSIS — R159 Full incontinence of feces: Secondary | ICD-10-CM | POA: Diagnosis not present

## 2024-07-06 DIAGNOSIS — C61 Malignant neoplasm of prostate: Secondary | ICD-10-CM | POA: Diagnosis present

## 2024-07-06 NOTE — Progress Notes (Signed)
 Radiation Oncology Follow up Note  Name: Willie Crane   Date:   07/06/2024 MRN:  [id number] DOB: [birth year]    This 78 y.o. 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  REFERRING PROVIDER: Sowles, Krichna, MD  HPI: Patient is a 78 year old male now out 1 month having completed image guided IMRT radiation therapy.  He is seen today in routine follow-up.  He is having no significant increased lower urinary tract symptoms.  He has developed some rectal incontinence is wearing depends undergarment.  States he is not really having diarrhea although sometimes he has extreme urgency for his bowel movements.  He is otherwise asymptomatic.  COMPLICATIONS OF TREATMENT: none  FOLLOW UP COMPLIANCE: keeps appointments   PHYSICAL EXAM:  BP (!) 189/87 Comment: patient stopped taking medications, started them back 2 days ago.  Pulse 84   Temp 98 F (36.7 C)   Resp 18   Ht 5' 6 (1.676 m)   Wt 227 lb 8 oz (103.2 kg)   BMI 36.72 kg/m  Well-developed well-nourished patient in NAD. HEENT reveals PERLA, EOMI, discs not visualized.  Oral cavity is clear. No oral mucosal lesions are identified. Neck is clear without evidence of cervical or supraclavicular adenopathy. Lungs are clear to A&P. Cardiac examination is essentially unremarkable with regular rate and rhythm without murmur rub or thrill. Abdomen is benign with no organomegaly or masses noted. Motor sensory and DTR levels are equal and symmetric in the upper and lower extremities. Cranial nerves II through XII are grossly intact. Proprioception is intact. No peripheral adenopathy or edema is identified. No motor or sensory levels are noted. Crude visual fields are within normal range.  RADIOLOGY RESULTS: No current films for review  PLAN: Present time patient is doing fairly well.  I  have asked him to monitor what he eats and any foods that set off his rectal incontinence should be avoided at this time.  I have otherwise asked to see him back in 3 months with a follow-up PSA.  I would like to keep him suppressed on Eligard  for least 2 years.  Patient will follow-up with urology.  Patient knows to call with any concerns.  I would like to take this opportunity to thank you for allowing me to participate in the care of your patient.SABRA Marcey Penton, MD

## 2024-07-09 ENCOUNTER — Ambulatory Visit

## 2024-08-11 ENCOUNTER — Telehealth: Payer: Self-pay | Admitting: Family Medicine

## 2024-08-11 NOTE — Telephone Encounter (Signed)
 Pt requested refill on quetiapine  fumarate 25mg . Please send to [facility] [address]. Only have 1 pill left

## 2024-08-11 NOTE — Telephone Encounter (Signed)
 Pt has been scheduled for tomorrow 08/12/24

## 2024-08-11 NOTE — Telephone Encounter (Signed)
 Unable to fill pt needs an appt, its been almost a year without being seen.

## 2024-08-11 NOTE — Telephone Encounter (Signed)
 Has appt. tomorrow

## 2024-08-12 ENCOUNTER — Ambulatory Visit: Admitting: Family Medicine

## 2024-08-26 ENCOUNTER — Telehealth: Payer: Self-pay | Admitting: Urology

## 2024-08-26 NOTE — Telephone Encounter (Signed)
 The patient contacted the office expressing confusion about his upcoming appointments. He would like clarification on why he is scheduled for PSA labs and an office visit with Dr. Francisca in February, followed by another set of PSA labs and an office visit at the cancer center in April. He is asking whether the February appointments are still necessary.

## 2024-08-26 NOTE — Telephone Encounter (Signed)
 Called pt advised him of the role of seeing a urologist vs oncologist in the management of his care. Pt voiced understanding, will keep f/u appt.

## 2024-09-05 ENCOUNTER — Other Ambulatory Visit: Payer: Self-pay | Admitting: Radiation Oncology

## 2024-09-11 ENCOUNTER — Other Ambulatory Visit

## 2024-09-16 ENCOUNTER — Ambulatory Visit: Admitting: Urology

## 2024-11-19 ENCOUNTER — Inpatient Hospital Stay

## 2024-11-26 ENCOUNTER — Ambulatory Visit: Admitting: Radiation Oncology
